# Patient Record
Sex: Male | Born: 1950 | Race: Black or African American | Hispanic: No | Marital: Single | Smoking: Former smoker
Health system: Southern US, Community
[De-identification: ages and names within clinical notes are randomized; demographics above are authoritative.]

## PROBLEM LIST (undated history)

## (undated) DIAGNOSIS — I10 Essential (primary) hypertension: Secondary | ICD-10-CM

## (undated) DIAGNOSIS — R7989 Other specified abnormal findings of blood chemistry: Secondary | ICD-10-CM

## (undated) DIAGNOSIS — D573 Sickle-cell trait: Secondary | ICD-10-CM

## (undated) HISTORY — PX: OTHER SURGICAL HISTORY: SHX169

## (undated) HISTORY — DX: Other specified abnormal findings of blood chemistry: R79.89

## (undated) HISTORY — DX: Sickle-cell trait: D57.3

## (undated) HISTORY — DX: Essential (primary) hypertension: I10

---

## 2001-09-25 ENCOUNTER — Ambulatory Visit: Admission: RE | Admit: 2001-09-25 | Discharge: 2001-09-25 | Payer: Self-pay | Admitting: Internal Medicine

## 2001-11-03 ENCOUNTER — Encounter: Admission: RE | Admit: 2001-11-03 | Discharge: 2001-11-03 | Payer: Self-pay | Admitting: Internal Medicine

## 2001-11-03 ENCOUNTER — Encounter: Payer: Self-pay | Admitting: Internal Medicine

## 2001-11-10 ENCOUNTER — Encounter: Payer: Self-pay | Admitting: Internal Medicine

## 2001-11-10 ENCOUNTER — Ambulatory Visit (HOSPITAL_COMMUNITY): Admission: RE | Admit: 2001-11-10 | Discharge: 2001-11-10 | Payer: Self-pay | Admitting: Internal Medicine

## 2003-02-17 ENCOUNTER — Emergency Department (HOSPITAL_COMMUNITY): Admission: EM | Admit: 2003-02-17 | Discharge: 2003-02-17 | Payer: Self-pay | Admitting: Emergency Medicine

## 2004-06-23 ENCOUNTER — Emergency Department (HOSPITAL_COMMUNITY): Admission: EM | Admit: 2004-06-23 | Discharge: 2004-06-23 | Payer: Self-pay | Admitting: Podiatry

## 2004-09-03 ENCOUNTER — Ambulatory Visit (HOSPITAL_COMMUNITY): Admission: RE | Admit: 2004-09-03 | Discharge: 2004-09-03 | Payer: Self-pay | Admitting: Gastroenterology

## 2004-09-03 ENCOUNTER — Encounter (INDEPENDENT_AMBULATORY_CARE_PROVIDER_SITE_OTHER): Payer: Self-pay | Admitting: Specialist

## 2009-11-28 ENCOUNTER — Ambulatory Visit (HOSPITAL_COMMUNITY): Admission: RE | Admit: 2009-11-28 | Discharge: 2009-11-28 | Payer: Self-pay | Admitting: General Practice

## 2009-11-28 ENCOUNTER — Encounter: Admission: RE | Admit: 2009-11-28 | Discharge: 2009-11-28 | Payer: Self-pay | Admitting: General Practice

## 2009-11-28 ENCOUNTER — Emergency Department (HOSPITAL_COMMUNITY): Admission: EM | Admit: 2009-11-28 | Discharge: 2009-11-28 | Payer: Self-pay | Admitting: Emergency Medicine

## 2010-10-30 NOTE — Op Note (Signed)
[name], [name]             ACCOUNT NO.:  [id number]   MEDICAL RECORD NO.:  [id number]          PATIENT TYPE:  AMB   LOCATION:  ENDO                         FACILITY:  [facility]   PHYSICIAN:  Graylin Shiver, M.D.   DATE OF BIRTH:  [birth year]   DATE OF PROCEDURE:  09/03/2004  DATE OF DISCHARGE:                                 OPERATIVE REPORT   PROCEDURE PERFORMED:  Colonoscopy with biopsy.   INDICATIONS:  Screening.   Informed consent was obtained after explanation of the risks of bleeding,  infection and perforation.   PREMEDICATION:  Fentanyl 100 mcg IV, Versed 10 milligrams IV.   PROCEDURE:  With the patient in the left lateral decubitus position, a  rectal exam was performed. No masses were felt. The Olympus colonoscope was  inserted into the rectum and advanced around the colon to the cecum. Cecal  landmarks were identified.  In the cecum on the distal lip of the ileocecal  valve, there was a 3 mm polyp biopsied off with cold forceps. The ascending  colon looked normal. There were some scattered diverticula noted in the  transverse colon, descending colon and sigmoid, the rectum looked normal.  The tolerated the procedure well without complications.   IMPRESSION:  1.  Cecal polyp.  2.  Diverticulosis.   PLAN:  The pathology will be checked.      SFG/MEDQ  D:  09/03/2004  T:  09/03/2004  Job:  [id number]   cc:   Brett Canales A. Cleta Alberts, M.D.  [address]  [place]  [place] [address]  Fax: [phone]

## 2011-06-25 ENCOUNTER — Other Ambulatory Visit: Payer: Self-pay | Admitting: General Practice

## 2011-06-25 ENCOUNTER — Ambulatory Visit
Admission: RE | Admit: 2011-06-25 | Discharge: 2011-06-25 | Disposition: A | Discharge status: Home / Self Care | Payer: BC Managed Care – PPO | Source: Ambulatory Visit | Attending: General Practice | Admitting: General Practice

## 2011-06-25 DIAGNOSIS — I10 Essential (primary) hypertension: Secondary | ICD-10-CM

## 2012-04-05 ENCOUNTER — Other Ambulatory Visit: Payer: Self-pay | Admitting: Family Medicine

## 2012-04-05 NOTE — Telephone Encounter (Signed)
Please pull chart.

## 2012-04-06 NOTE — Telephone Encounter (Signed)
Chart pulled to PA pool at nurses station DOS 02/28/11

## 2012-06-21 ENCOUNTER — Other Ambulatory Visit: Payer: Self-pay | Admitting: General Practice

## 2012-06-21 ENCOUNTER — Ambulatory Visit
Admission: RE | Admit: 2012-06-21 | Discharge: 2012-06-21 | Disposition: A | Discharge status: Home / Self Care | Payer: BC Managed Care – PPO | Source: Ambulatory Visit | Attending: General Practice | Admitting: General Practice

## 2012-06-21 DIAGNOSIS — Z0000 Encounter for general adult medical examination without abnormal findings: Secondary | ICD-10-CM

## 2012-06-21 DIAGNOSIS — I10 Essential (primary) hypertension: Secondary | ICD-10-CM

## 2014-10-14 ENCOUNTER — Other Ambulatory Visit: Payer: Self-pay | Admitting: General Practice

## 2014-10-14 ENCOUNTER — Ambulatory Visit
Admission: RE | Admit: 2014-10-14 | Discharge: 2014-10-14 | Disposition: A | Discharge status: Home / Self Care | Payer: BC Managed Care – PPO | Source: Ambulatory Visit | Attending: General Practice | Admitting: General Practice

## 2014-10-14 DIAGNOSIS — Z0000 Encounter for general adult medical examination without abnormal findings: Secondary | ICD-10-CM

## 2015-05-07 ENCOUNTER — Ambulatory Visit (INDEPENDENT_AMBULATORY_CARE_PROVIDER_SITE_OTHER): Payer: BC Managed Care – PPO | Admitting: Emergency Medicine

## 2015-05-07 ENCOUNTER — Ambulatory Visit (INDEPENDENT_AMBULATORY_CARE_PROVIDER_SITE_OTHER): Payer: BC Managed Care – PPO

## 2015-05-07 ENCOUNTER — Encounter: Payer: Self-pay | Admitting: Emergency Medicine

## 2015-05-07 VITALS — BP 130/88 | HR 69 | Temp 99.2°F | Resp 18 | Ht 68.8 in | Wt 233.0 lb

## 2015-05-07 DIAGNOSIS — Z1211 Encounter for screening for malignant neoplasm of colon: Secondary | ICD-10-CM

## 2015-05-07 DIAGNOSIS — R059 Cough, unspecified: Secondary | ICD-10-CM

## 2015-05-07 DIAGNOSIS — R739 Hyperglycemia, unspecified: Secondary | ICD-10-CM | POA: Diagnosis not present

## 2015-05-07 DIAGNOSIS — N528 Other male erectile dysfunction: Secondary | ICD-10-CM | POA: Diagnosis not present

## 2015-05-07 DIAGNOSIS — N529 Male erectile dysfunction, unspecified: Secondary | ICD-10-CM | POA: Insufficient documentation

## 2015-05-07 DIAGNOSIS — R5382 Chronic fatigue, unspecified: Secondary | ICD-10-CM | POA: Diagnosis not present

## 2015-05-07 DIAGNOSIS — R05 Cough: Secondary | ICD-10-CM | POA: Diagnosis not present

## 2015-05-07 DIAGNOSIS — R0602 Shortness of breath: Secondary | ICD-10-CM | POA: Diagnosis not present

## 2015-05-07 DIAGNOSIS — Z114 Encounter for screening for human immunodeficiency virus [HIV]: Secondary | ICD-10-CM | POA: Diagnosis not present

## 2015-05-07 DIAGNOSIS — Z1159 Encounter for screening for other viral diseases: Secondary | ICD-10-CM

## 2015-05-07 DIAGNOSIS — R0683 Snoring: Secondary | ICD-10-CM | POA: Diagnosis not present

## 2015-05-07 DIAGNOSIS — I10 Essential (primary) hypertension: Secondary | ICD-10-CM | POA: Diagnosis not present

## 2015-05-07 DIAGNOSIS — H9201 Otalgia, right ear: Secondary | ICD-10-CM | POA: Diagnosis not present

## 2015-05-07 LAB — COMPLETE METABOLIC PANEL WITH GFR
ALT: 17 U/L (ref 9–46)
AST: 22 U/L (ref 10–35)
Albumin: 4 g/dL (ref 3.6–5.1)
Alkaline Phosphatase: 64 U/L (ref 40–115)
BILIRUBIN TOTAL: 0.6 mg/dL (ref 0.2–1.2)
BUN: 17 mg/dL (ref 7–25)
CALCIUM: 9.1 mg/dL (ref 8.6–10.3)
CHLORIDE: 102 mmol/L (ref 98–110)
CO2: 30 mmol/L (ref 20–31)
CREATININE: 1.18 mg/dL (ref 0.70–1.25)
GFR, EST AFRICAN AMERICAN: 76 mL/min (ref >=60)
GFR, Est Non African American: 66 mL/min (ref >=60)
Glucose, Bld: 117 mg/dL — ABNORMAL HIGH (ref 65–99)
Potassium: 3.5 mmol/L (ref 3.5–5.3)
Sodium: 141 mmol/L (ref 135–146)
Total Protein: 6.6 g/dL (ref 6.1–8.1)

## 2015-05-07 LAB — POCT URINALYSIS DIP (MANUAL ENTRY)
BILIRUBIN UA: NEGATIVE
BILIRUBIN UA: NEGATIVE
Glucose, UA: NEGATIVE
Leukocytes, UA: NEGATIVE
Nitrite, UA: NEGATIVE
PH UA: 7
Protein Ur, POC: 30 — AB
RBC UA: NEGATIVE
Spec Grav, UA: 1.015
Urobilinogen, UA: 0.2

## 2015-05-07 LAB — POCT CBC
Granulocyte percent: 57.4 %G (ref 37–80)
HEMATOCRIT: 41.2 % — AB (ref 43.5–53.7)
HEMOGLOBIN: 14 g/dL — AB (ref 14.1–18.1)
Lymph, poc: 2.3 (ref 0.6–3.4)
MCH: 31.9 pg — AB (ref 27–31.2)
MCHC: 33.9 g/dL (ref 31.8–35.4)
MCV: 94 fL (ref 80–97)
MID (cbc): 0.4 (ref 0–0.9)
MPV: 7.7 fL (ref 0–99.8)
POC GRANULOCYTE: 3.6 (ref 2–6.9)
POC LYMPH PERCENT: 36.3 %L (ref 10–50)
POC MID %: 6.3 % (ref 0–12)
Platelet Count, POC: 205 10*3/uL (ref 142–424)
RBC: 4.38 M/uL — AB (ref 4.69–6.13)
RDW, POC: 13.6 %
WBC: 6.3 10*3/uL (ref 4.6–10.2)

## 2015-05-07 LAB — TSH: TSH: 0.938 u[IU]/mL (ref 0.350–4.500)

## 2015-05-07 LAB — HEMOCCULT GUIAC POC 1CARD (OFFICE): Card #1 Date: NEGATIVE

## 2015-05-07 LAB — GLUCOSE, POCT (MANUAL RESULT ENTRY): POC Glucose: 132 mg/dl — AB (ref 70–99)

## 2015-05-07 MED ORDER — LOSARTAN POTASSIUM 100 MG PO TABS: 100.0000 mg | ORAL_TABLET | Freq: Every day | ORAL | Status: DC

## 2015-05-07 MED ORDER — SILDENAFIL CITRATE 100 MG PO TABS: 50.0000 mg | ORAL_TABLET | Freq: Every day | ORAL | Status: DC | PRN

## 2015-05-07 NOTE — Patient Instructions (Signed)
I have made you an appointment for a sleep study. I have made to a referral to cardiology and gastroenterology. Please stop your lisinopril. Please start your losartan.

## 2015-05-07 NOTE — Progress Notes (Addendum)
Patient ID: Jeffrey Parks, male   DOB: July 07, 1950, 64 y.o.   MRN: [id number]     This chart was scribed for Arlyss Queen, MD by Zola Button, Medical Scribe. This patient was seen in room 2 and the patient's care was started at 11:32 AM.   Chief Complaint:  Chief Complaint  Patient presents with  . Dizziness    Onset 2 months  . Fatigue    4-5 months  . No sex drive  . Otalgia    Right, onset 2 days  . Cough    Onset 2 weeks    HPI: Jeffrey Parks is a 64 y.o. male who reports to [facility] today complaining of gradual onset cough that started 2 weeks ago. Patient reports associated right ear pain and chest pain with cough. He has also had fatigue, decreased sexual drive, and intermittent lightheadedness over the past 3 months. Patient notes that he sleeps a lot and has been noted to snore sometimes by his wife. He has not had a sleep study done before and has not had blood work in over 2 years. Patient has a history of smoking (3 cigarettes a day), but quit about a year ago. He does drink alcohol, about 3-4 drinks a week. His FMHx includes DM on his mother's side. He has had a colonoscopy before. He last had his prostate checked a few years ago.  Patient has seen me in the past, several years ago. He has been seeing Dr. Amadeo Garnet for his care. He is a professor at [employer]. He is originally from [place] and [place].  Past Medical History  Diagnosis Date  . Hypertension    History reviewed. No pertinent past surgical history. Social History   Social History  . Marital Status: Single    Spouse Name: N/A  . Number of Children: N/A  . Years of Education: N/A   Social History Main Topics  . Smoking status: Former Research scientist (life sciences)  . Smokeless tobacco: None  . Alcohol Use: 1.8 oz/week    3 Standard drinks or equivalent per week  . Drug Use: No  . Sexual Activity: Not Asked   Other Topics Concern  . None   Social History Narrative  . None   Family History  Problem Relation Age of Onset    . Hypertension Mother    No Known Allergies Prior to Admission medications   Medication Sig Start Date End Date Taking? Authorizing Provider  amLODipine (NORVASC) 10 MG tablet Take 1 tablet (10 mg total) by mouth daily. Needs office visit/labs 04/05/12   Collene Leyden, PA-C  chlorthalidone (HYGROTON) 25 MG tablet Take 1 tablet (25 mg total) by mouth daily. Needs office visit/labs 04/05/12   Collene Leyden, PA-C  lisinopril (PRINIVIL,ZESTRIL) 40 MG tablet Take 1 tablet (40 mg total) by mouth daily. Needs office visit/labs 04/05/12   Collene Leyden, PA-C     ROS: The patient denies fevers, chills, night sweats, unintentional weight loss, palpitations, wheezing, dyspnea on exertion, nausea, vomiting, abdominal pain, dysuria, hematuria, melena, numbness, weakness, or tingling.   All other systems have been reviewed and were otherwise negative with the exception of those mentioned in the HPI and as above.    PHYSICAL EXAM: Filed Vitals:   05/07/15 1124 05/07/15 1128  BP: 160/92 130/88  Pulse: 69   Temp: 99.2 F (37.3 C)   Resp: 18    Body mass index is 34.59 kg/(m^2).   General: Alert, no acute distress HEENT:  Normocephalic,  atraumatic, oropharynx patent. Wax in right auditory canal. Eye: [facility], [facility] Cardiovascular:  Regular rate and rhythm, no rubs murmurs or gallops.  No Carotid bruits, radial pulse intact. No pedal edema.  Respiratory: Clear to auscultation bilaterally.  No wheezes, rales, or rhonchi.  No cyanosis, no use of accessory musculature Abdominal: Obese, no masses. No hernias. Musculoskeletal: Gait intact. No edema, tenderness Skin: No rashes. Neurologic: Facial musculature symmetric. Psychiatric: Patient acts appropriately throughout our interaction. Lymphatic: No cervical or submandibular lymphadenopathy Genitourinary: Prostate not enlarged.   LABS: Results for orders placed or performed in visit on 05/07/15  POCT CBC  Result Value Ref Range   WBC 6.3  4.6 - 10.2 K/uL   Lymph, poc 2.3 0.6 - 3.4   POC LYMPH PERCENT 36.3 10 - 50 %L   MID (cbc) 0.4 0 - 0.9   POC MID % 6.3 0 - 12 %M   POC Granulocyte 3.6 2 - 6.9   Granulocyte percent 57.4 37 - 80 %G   RBC 4.38 (A) 4.69 - 6.13 M/uL   Hemoglobin 14.0 (A) 14.1 - 18.1 g/dL   HCT, POC 41.2 (A) 43.5 - 53.7 %   MCV 94.0 80 - 97 fL   MCH, POC 31.9 (A) 27 - 31.2 pg   MCHC 33.9 31.8 - 35.4 g/dL   RDW, POC 13.6 %   Platelet Count, POC 205 142 - 424 K/uL   MPV 7.7 0 - 99.8 fL  POCT urinalysis dipstick  Result Value Ref Range   Color, UA yellow yellow   Clarity, UA clear clear   Glucose, UA negative negative   Bilirubin, UA negative negative   Ketones, POC UA negative negative   Spec Grav, UA 1.015    Blood, UA negative negative   pH, UA 7.0    Protein Ur, POC =30 (A) negative   Urobilinogen, UA 0.2    Nitrite, UA Negative Negative   Leukocytes, UA Negative Negative  POCT glucose (manual entry)  Result Value Ref Range   POC Glucose 132 (A) 70 - 99 mg/dl     EKG/XRAY:   Primary read interpreted by Dr. Everlene Farrier at [facility]. Normal sinus rhythm. There is a mild curve in the thoracic spine. With clear lung fields normal heart size on chest x-ray.   ASSESSMENT/PLAN: I changed him from lisinopril to losartan to help with cough. Sleep study was ordered. Regarding his fatigue I ordered a TSH as well as the sleep study. Referral made to cardiology because of his complaints of fatigue and shortness of breath. I gave him a prescription for Viagra for ED.  By signing my name below, I, Zola Button, attest that this documentation has been prepared under the direction and in the presence of Arlyss Queen, MD.  Electronically Signed: Zola Button, Medical Scribe. 05/07/2015. 11:32 AM.   Johney Maine sideeffects, risk and benefits, and alternatives of medications d/w patient. Patient is aware that all medications have potential sideeffects and we are unable to predict every sideeffect or drug-drug interaction that may  occur.  Arlyss Queen MD 05/07/2015 11:32 AM

## 2015-05-08 LAB — HEPATITIS C ANTIBODY: HCV AB: NEGATIVE

## 2015-05-08 LAB — HIV ANTIBODY (ROUTINE TESTING W REFLEX): HIV 1&2 Ab, 4th Generation: NONREACTIVE

## 2015-05-12 LAB — TESTOSTERONE, FREE, TOTAL, SHBG
SEX HORMONE BINDING: 26 nmol/L (ref 22–77)
Testosterone, Free: 43.2 pg/mL — ABNORMAL LOW (ref 47.0–244.0)
Testosterone-% Free: 2.2 % (ref 1.6–2.9)
Testosterone: 199 ng/dL — ABNORMAL LOW (ref 300–890)

## 2015-05-14 ENCOUNTER — Other Ambulatory Visit: Payer: Self-pay | Admitting: *Deleted

## 2015-05-14 ENCOUNTER — Telehealth: Payer: Self-pay | Admitting: *Deleted

## 2015-05-14 DIAGNOSIS — R7989 Other specified abnormal findings of blood chemistry: Secondary | ICD-10-CM

## 2015-05-14 NOTE — Telephone Encounter (Signed)
Pt called about his colonscopy.  He stated that he had one somewhere and do not remember where he got it.  He said that we made the referral.  I pulled his old chart and did not see where we had ever referred him.  Advised him that I could give him some numbers per Dr. Everlene Farrier and he could call around to see. He declined.  It shows written in the paper chart that it was done in 2010.

## 2015-05-15 ENCOUNTER — Encounter: Payer: Self-pay | Admitting: Endocrinology

## 2015-05-22 ENCOUNTER — Ambulatory Visit (INDEPENDENT_AMBULATORY_CARE_PROVIDER_SITE_OTHER): Payer: BC Managed Care – PPO | Admitting: Neurology

## 2015-05-22 ENCOUNTER — Encounter: Payer: Self-pay | Admitting: Endocrinology

## 2015-05-22 ENCOUNTER — Ambulatory Visit (INDEPENDENT_AMBULATORY_CARE_PROVIDER_SITE_OTHER): Payer: BC Managed Care – PPO | Admitting: Endocrinology

## 2015-05-22 ENCOUNTER — Encounter: Payer: Self-pay | Admitting: Neurology

## 2015-05-22 VITALS — BP 143/94 | HR 88 | Temp 98.2°F | Ht 68.5 in | Wt 246.0 lb

## 2015-05-22 VITALS — BP 118/80 | HR 86 | Resp 29 | Ht 68.0 in | Wt 244.0 lb

## 2015-05-22 DIAGNOSIS — E349 Endocrine disorder, unspecified: Secondary | ICD-10-CM

## 2015-05-22 DIAGNOSIS — E291 Testicular hypofunction: Secondary | ICD-10-CM | POA: Diagnosis not present

## 2015-05-22 DIAGNOSIS — Z125 Encounter for screening for malignant neoplasm of prostate: Secondary | ICD-10-CM

## 2015-05-22 DIAGNOSIS — R0683 Snoring: Secondary | ICD-10-CM | POA: Diagnosis not present

## 2015-05-22 LAB — PSA: PSA: 0.73 ng/mL (ref 0.10–4.00)

## 2015-05-22 LAB — LUTEINIZING HORMONE: LH: 3.38 m[IU]/mL (ref 1.50–9.30)

## 2015-05-22 MED ORDER — CLOMIPHENE CITRATE 50 MG PO TABS: ORAL_TABLET | ORAL | Status: DC

## 2015-05-22 NOTE — Progress Notes (Signed)
SLEEP MEDICINE CLINIC   Provider:  Larey Seat, M D  Referring Provider: Darlyne Russian, MD Primary Care Physician:  Jenny Reichmann, MD  Chief Complaint  Patient presents with  . New Patient (Initial Visit)    daytime fatigue, occasional snoring at night per wife, rm 54, alone    HPI:  Jeffrey Parks is a 64 y.o. male , seen here as a referral from Dr. Everlene Farrier for  a sleep consultation,  Mr. Ronyn Dziadosz presents today upon referral of his primary care physician Dr. Everlene Farrier, who reported that the patient had an increasing fatigue for the last 4 or 5 months and that his wife had confirmed that the patient snores. There was also some intermittent lightheadedness. He had never been evaluated in a sleep study.  He has a history of smoking also he only smoked 3 cigarettes a day and quit about a year ago. He drinks alcohol 3 or 4 drinks a week very moderately, he drinks diet coke, but not daily. No coffee and no tea.    When he presented he had also chest pain and was coughing. He does not feel excessively daytime sleepy and actually feels that his sleep quality is good. He does not have problems falling asleep and staying asleep. His wife sometimes feels he sleeps too much but he thinks that this is actually the sleep he needed all his life.   He endorsed the Epworth sleepiness score at 2 and the fatigue severity score at 18 points the geriatric depression score at one point.  Sleep habits are as follows: Usually he goes to bed shortly before Midnight , and he falls asleep very promptly. His bedroom is described as core, quiet and dark. He shares it with his wife.  Sometimes his sleep is restless but not every night usually only when he ate late.  He is not sure if his wife has witnessed any position dependent snoring. He prefers to sleep on his side, he sleeps on more than one pillow. He will have to to 3 times at night the urge to urinate and go to the bathroom. He usually rises in the morning at  about 7 AM, feeling well. He does not report any morning headaches, he usually does not have a dry mouth. He is ready to start his day. He relies on an alarm. His average nocturnal sleep time is between 6 and 7 hours. When he is not at work he will take daytime naps at home. He does not struggle with drowsiness at work. A nap at home will last about 2 hours. The naps is also refreshing and restoring. He usually naps in bed.   Sleep medical history and family sleep history:  He has no family history of sleep disorders, no personal history of sleep walking.   Social history:   Review of Systems: Out of a complete 14 system review, the patient complains of only the following symptoms, and all other reviewed systems are negative. Snoring. No DM, HTN controlled on medications.  Epworth score 2 , Fatigue severity score 18  , depression score 2.    Social History   Social History  . Marital Status: Single    Spouse Name: N/A  . Number of Children: N/A  . Years of Education: N/A   Occupational History  . Not on file.   Social History Main Topics  . Smoking status: Former Research scientist (life sciences)  . Smokeless tobacco: Not on file  . Alcohol Use: 1.8  oz/week    3 Standard drinks or equivalent per week  . Drug Use: No  . Sexual Activity: Not on file   Other Topics Concern  . Not on file   Social History Narrative    Family History  Problem Relation Age of Onset  . Hypertension Mother   . Other Neg Hx     hypogonadism  . Prostate cancer Father     Past Medical History  Diagnosis Date  . Hypertension     History reviewed. No pertinent past surgical history.  Current Outpatient Prescriptions  Medication Sig Dispense Refill  . amLODipine (NORVASC) 10 MG tablet Take 1 tablet (10 mg total) by mouth daily. Needs office visit/labs 30 tablet 0  . chlorthalidone (HYGROTON) 25 MG tablet Take 1 tablet (25 mg total) by mouth daily. Needs office visit/labs 30 tablet 0  . losartan (COZAAR) 100 MG  tablet Take 1 tablet (100 mg total) by mouth daily. 90 tablet 3  . sildenafil (VIAGRA) 100 MG tablet Take 0.5-1 tablets (50-100 mg total) by mouth daily as needed for erectile dysfunction. (Patient not taking: Reported on 05/22/2015) 5 tablet 11   No current facility-administered medications for this visit.    Allergies as of 05/22/2015  . (No Known Allergies)    Vitals: BP 118/80 mmHg  Pulse 86  Resp 29  Ht 5\' 8"  (1.727 m)  Wt 244 lb (110.678 kg)  BMI 37.11 kg/m2 Last Weight:  Wt Readings from Last 1 Encounters:  05/22/15 244 lb (110.678 kg)   [id number] mass index is 37.11 kg/(m^2).     Last Height:   Ht Readings from Last 1 Encounters:  05/22/15 5\' 8"  (1.727 m)    Physical exam:  General: The patient is awake, alert and appears not in acute distress. The patient is well groomed. Head: Normocephalic, atraumatic. Neck is supple. Mallampati 4,  neck circumference:19.5. Nasal airflow restricited, congested, TMJ is not  evident. Retrognathia is seen.  Cardiovascular:  Regular rate and rhythm without  murmurs or carotid bruit, and without distended neck veins. Respiratory: Lungs are clear to auscultation. Skin:  Without evidence of edema, or rash Trunk: BMI is elevated . Neurologic exam : The patient is awake and alert, oriented to place and time.   Memory subjective described as intact.  Attention span & concentration ability appears normal.  Speech is fluent, without  dysarthria, dysphonia or aphasia.  Mood and affect are appropriate.  Cranial nerves: Pupils are equal and briskly reactive to light.  Funduscopic exam without  evidence of pallor or edema.  Extraocular movements  in vertical and horizontal planes intact and without nystagmus.  Visual fields by finger perimetry are intact. Hearing to finger rub intact.   Facial sensation intact to fine touch.  Facial motor strength is symmetric and tongue and uvula move midline. Shoulder shrug was symmetrical.   Motor exam:  Normal tone, muscle bulk and symmetric strength in all extremities. Sensory:  Fine touch, pinprick and vibration were tested in all extremities.  Proprioception tested in the upper extremities was normal. Coordination: Rapid alternating movements in the fingers/hands was normal.  Finger-to-nose maneuver  normal without evidence of ataxia, dysmetria or tremor.  Gait and station: Patient walks without assistive device and is able unassisted to climb up to the exam table. Strength within normal limits.  Stance is stable and normal.  Toe and heel stand were intact.Tandem gait is fragmented. Turns with 4  Steps.   Deep tendon reflexes: in the  upper and  lower extremities are symmetric and intact..  The patient was advised of the nature of the diagnosed sleep disorder , the treatment options and risks for general a health and wellness arising from not treating the condition.  I spent more than 30 minutes of face to face time with the patient. Greater than 50% of time was spent in counseling and coordination of care. We have discussed the diagnosis and differential and I answered the patient's questions.     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 The snoring that his wife has witnessed is not an everyday phenomenon. It may be positional but he can't be sure.  He reports that his weight has been stable over the last 2 years he does have an elevated body mass index. He does have a larger neck circumference,  he has mild retrognathia , does have a crowded lower jaw with a high-grade Mallampati-  which makes it likely that he snores.   I would like to order a home sleep test for this  patient. My goal is to screen for apnea.  We have a to meet with the patient after the home sleep test results are available. Given his reported benign symptomatology it is possible that he may not have apnea at all. He was checked for testosterone, and will start a supplement with Dr. Loanne Drilling, he has been taking multivitamin.    Asencion Partridge Kylar Speelman MD  05/22/2015   CC: Darlyne Russian, [address] [place], [place] [address]

## 2015-05-22 NOTE — Patient Instructions (Signed)
blood tests are requested for you today.  We'll let you know about the results. Based on the results, I may be able to prescribe for you a pill to raise the testosterone. normalization of testosterone is not known to harm you.  however, there are "theoretical" risks, including increased fertility, hair loss, prostate cancer, benign prostate enlargement, blood clots, liver problems, lower hdl ("good cholesterol"), polycythemia (opposite of anemia), sleep apnea, and behavior changes Weight loss helps the testosterone also.

## 2015-05-22 NOTE — Progress Notes (Signed)
Subjective:    Patient ID: Jeffrey Parks, male    DOB: [birth year], 64 y.o.   MRN: [id number]  HPI Pt reports he had puberty at the normal age.  He has 4 biological children.  He says he has never taken illicit androgens.  He does not take antiandrogens or opioids.  He denies any h/o infertility, XRT, or genital infection.  He has never had surgery, or a serious injury to the head or genital area.  He does not consume alcohol excessively.  He has slight dizziness sensation in the head, and assoc fatigue. He took testosterone injections x 3 months, in early 2016.  He stopped, due to lack of improvement in his sxs.   Past Medical History  Diagnosis Date  . Hypertension     No past surgical history on file.  Social History   Social History  . Marital Status: Single    Spouse Name: N/A  . Number of Children: N/A  . Years of Education: N/A   Occupational History  . Not on file.   Social History Main Topics  . Smoking status: Former Research scientist (life sciences)  . Smokeless tobacco: Not on file  . Alcohol Use: 1.8 oz/week    3 Standard drinks or equivalent per week  . Drug Use: No  . Sexual Activity: Not on file   Other Topics Concern  . Not on file   Social History Narrative    Current Outpatient Prescriptions on File Prior to Visit  Medication Sig Dispense Refill  . amLODipine (NORVASC) 10 MG tablet Take 1 tablet (10 mg total) by mouth daily. Needs office visit/labs 30 tablet 0  . chlorthalidone (HYGROTON) 25 MG tablet Take 1 tablet (25 mg total) by mouth daily. Needs office visit/labs 30 tablet 0  . losartan (COZAAR) 100 MG tablet Take 1 tablet (100 mg total) by mouth daily. 90 tablet 3  . sildenafil (VIAGRA) 100 MG tablet Take 0.5-1 tablets (50-100 mg total) by mouth daily as needed for erectile dysfunction. (Patient not taking: Reported on 05/22/2015) 5 tablet 11   No current facility-administered medications on file prior to visit.    No Known Allergies  Family History  Problem  Relation Age of Onset  . Hypertension Mother   . Other Neg Hx     hypogonadism  . Prostate cancer Father     BP 143/94 mmHg  Pulse 88  Temp(Src) 98.2 F (36.8 C) (Oral)  Ht 5' 8.5" (1.74 m)  Wt 246 lb (111.585 kg)  BMI 36.86 kg/m2  SpO2 98%   Review of Systems denies depression, numbness, weight change, decreased urinary stream, gynecomastia, fever, headache, easy bruising, rash, blurry vision, rhinorrhea, chest pain.  He has doe, myalgias, and ED sxs.      Objective:   Physical Exam VS: see vs page GEN: no distress HEAD: head: no deformity eyes: no periorbital swelling, no proptosis external nose and ears are normal mouth: no lesion seen NECK: supple, thyroid is not enlarged CHEST WALL: no deformity LUNGS: clear to auscultation BREASTS:  No gynecomastia CV: reg rate and rhythm, no murmur ABD: abdomen is soft, nontender.  no hepatosplenomegaly.  not distended.  no hernia GENITALIA:  Normal male.   MUSCULOSKELETAL: muscle bulk and strength are grossly normal.  no obvious joint swelling.  gait is normal and steady EXTEMITIES: no deformity.  no ulcer on the feet.  feet are of normal color and temp.  1+ bilat leg edema. PULSES: dorsalis pedis intact bilat.  no carotid bruit  NEURO:  cn 2-12 grossly intact.   readily moves all 4's.  sensation is intact to touch on the feet SKIN:  Normal texture and temperature.  No rash or suspicious lesion is visible.  Normal hair distribution. NODES:  None palpable at the neck. PSYCH: alert, well-oriented.  Does not appear anxious nor depressed.  Lab Results  Component Value Date   TESTOSTERONE 199* 05/07/2015   LH=3 Prolactin=7 PSA=0.7  I have reviewed outside records, and summarized: Pt was noted to have hypogonadism, and referred here.      Assessment & Plan:  Central hypogonadism, new, uncertain etiology.  Testosterone level is not low enough to need MRI.    Patient is advised the following: Patient Instructions  blood  tests are requested for you today.  We'll let you know about the results. Based on the results, I may be able to prescribe for you a pill to raise the testosterone. normalization of testosterone is not known to harm you.  however, there are "theoretical" risks, including increased fertility, hair loss, prostate cancer, benign prostate enlargement, blood clots, liver problems, lower hdl ("good cholesterol"), polycythemia (opposite of anemia), sleep apnea, and behavior changes Weight loss helps the testosterone also.  addendum: i have sent a prescription to your pharmacy, for clomid Recheck labs in 4-6 weeks

## 2015-05-22 NOTE — Patient Instructions (Signed)
Hypersomnia Hypersomnia is when you feel extremely tired during the day even though you're getting plenty of sleep at night. You may need to take naps during the day, and you may also be extremely difficult to wake up when you are sleeping.  CAUSES  The cause of your hypersomnia may not be known. Hypersomnia may be caused by:   Medicines.  Sleep disorders, such as narcolepsy.  Trauma or injury to your head or nervous system.  Using drugs or alcohol.  Tumors.  Medical conditions, such as depression or hypothyroidism.  Genetics. SIGNS AND SYMPTOMS  The main symptoms of hypersomnia include:   Feeling extremely tired throughout the day.  Being very difficult to wake up.  Sleeping for longer and longer periods.  Taking naps throughout the day. Other symptoms may include:   Feeling:  Restless.  Annoyed.  Anxious.  Low energy.  Having difficulty:  Remembering.  Speaking.  Thinking.  Losing your appetite.  Experiencing hallucinations. DIAGNOSIS  Hypersomnia may be diagnosed by:  Medical history and physical exam. This will include a sleep history.  Completing sleep logs.  Tests may also be done, such as:  Polysomnography.  Multiple sleep latency test (MSLT). TREATMENT  There is no cure for hypersomnia, but treatment can be very effective in helping manage the condition. Treatment may include:  Lifestyle and sleeping strategies to help cope with the condition.  Stimulant medicines.  Treating any underlying causes of hypersomnia. HOME CARE INSTRUCTIONS  Take medicines only as directed by your health care provider.  Schedule short naps for when you feel sleepiest during the day. Tell your employer or teachers that you have hypersomnia. You may be able to adjust your schedule to include time for naps.  Avoid drinking alcohol or caffeinated beverages.  Do not eat a heavy meal before bedtime. Eat at about the same times every day.  Do not drive or  operate heavy machinery if you are sleepy.  Do not swim or go out on the water without a life jacket.  If possible, adjust your schedule so that you do not have to work or be active at night.  Keep all follow-up visits as directed by your health care provider. This is important. SEEK MEDICAL CARE IF:   You have new symptoms.  Your symptoms get worse. SEEK IMMEDIATE MEDICAL CARE IF:  You have serious thoughts of hurting yourself or someone else.   This information is not intended to replace advice given to you by your health care provider. Make sure you discuss any questions you have with your health care provider.   Document Released: 05/21/2002 Document Revised: 06/21/2014 Document Reviewed: 01/03/2014 Elsevier Interactive Patient Education 2016 Elsevier Inc.  

## 2015-05-23 LAB — PROLACTIN: PROLACTIN: 7.5 ng/mL (ref 2.1–17.1)

## 2015-05-26 ENCOUNTER — Ambulatory Visit: Payer: BC Managed Care – PPO | Admitting: Endocrinology

## 2015-05-27 ENCOUNTER — Ambulatory Visit: Payer: BC Managed Care – PPO | Admitting: Endocrinology

## 2015-05-30 ENCOUNTER — Ambulatory Visit (INDEPENDENT_AMBULATORY_CARE_PROVIDER_SITE_OTHER): Payer: BC Managed Care – PPO | Admitting: Cardiology

## 2015-05-30 ENCOUNTER — Encounter: Payer: Self-pay | Admitting: Cardiology

## 2015-05-30 VITALS — BP 134/92 | HR 72 | Ht 67.0 in | Wt 249.1 lb

## 2015-05-30 DIAGNOSIS — R5382 Chronic fatigue, unspecified: Secondary | ICD-10-CM | POA: Diagnosis not present

## 2015-05-30 DIAGNOSIS — R0600 Dyspnea, unspecified: Secondary | ICD-10-CM

## 2015-05-30 DIAGNOSIS — R5383 Other fatigue: Secondary | ICD-10-CM | POA: Insufficient documentation

## 2015-05-30 DIAGNOSIS — E785 Hyperlipidemia, unspecified: Secondary | ICD-10-CM | POA: Diagnosis not present

## 2015-05-30 DIAGNOSIS — Z79899 Other long term (current) drug therapy: Secondary | ICD-10-CM | POA: Diagnosis not present

## 2015-05-30 LAB — BASIC METABOLIC PANEL
BUN: 13 mg/dL (ref 7–25)
CALCIUM: 9.6 mg/dL (ref 8.6–10.3)
CO2: 30 mmol/L (ref 20–31)
Chloride: 101 mmol/L (ref 98–110)
Creat: 1.15 mg/dL (ref 0.70–1.25)
GLUCOSE: 107 mg/dL — AB (ref 65–99)
POTASSIUM: 3.6 mmol/L (ref 3.5–5.3)
SODIUM: 138 mmol/L (ref 135–146)

## 2015-05-30 LAB — LIPID PANEL
CHOL/HDL RATIO: 3.7 ratio (ref <=5.0)
CHOLESTEROL: 181 mg/dL (ref 125–200)
HDL: 49 mg/dL (ref >=40)
LDL Cholesterol: 115 mg/dL (ref <130)
Triglycerides: 86 mg/dL (ref <150)
VLDL: 17 mg/dL (ref <30)

## 2015-05-30 NOTE — Progress Notes (Signed)
Cardiology Office Note   Date:  05/30/2015   ID:  Jeffrey Parks, DOB Dec 12, 1950, MRN [id number]  PCP:  Jenny Reichmann, MD  Cardiologist:   Minus Breeding, MD   Chief Complaint  Patient presents with  . Fatigue  . Shortness of Breath      History of Present Illness: Jeffrey Parks is a 64 y.o. male who presents for evaluation of fatigue. He was referred by Dr. Elder Cyphers we'll also mention shortness of breath. The patient's predominant complaint has been feeling very sluggish particularly when he walks.   Today he is denying any significant shortness of breath with this. Because of his significant fatigue he was sent for a sleep study but was told that he didn't have significant sleep disorder. He has been found to have low testosterone and has just started having this replaced. He's not particularly active. However, with these activities of daily living he reports that he feels tired when he tries to do things. He thinks this is all of the time. He's not in pain. He does not describe chest pressure, neck or arm discomfort. He's not describing palpitations, presyncope or syncope. I did review his vitals and he has slowly been gaining weight but he doesn't think his diet has changed dramatically. I reviewed his blood work his thyroid was normal. He is not anemic.  Past Medical History  Diagnosis Date  . Hypertension   . Low testosterone   . Sickle cell trait [facility])     Past Surgical History  Procedure Laterality Date  . None       Current Outpatient Prescriptions  Medication Sig Dispense Refill  . amLODipine (NORVASC) 10 MG tablet Take 1 tablet (10 mg total) by mouth daily. Needs office visit/labs 30 tablet 0  . chlorthalidone (HYGROTON) 25 MG tablet Take 1 tablet (25 mg total) by mouth daily. Needs office visit/labs 30 tablet 0  . clomiPHENE (CLOMID) 50 MG tablet 1/4 tab daily 10 tablet 11  . losartan (COZAAR) 100 MG tablet Take 1 tablet (100 mg total) by mouth daily. 90  tablet 3  . sildenafil (VIAGRA) 100 MG tablet Take 0.5-1 tablets (50-100 mg total) by mouth daily as needed for erectile dysfunction. 5 tablet 11   No current facility-administered medications for this visit.    Allergies:   Review of patient's allergies indicates no known allergies.    Social History:  The patient  reports that he has quit smoking. His smoking use included Cigarettes. He does not have any smokeless tobacco history on file. He reports that he drinks about 1.8 oz of alcohol per week. He reports that he does not use illicit drugs.   Family History:  The patient's family history includes Hypertension in his mother; Sickle cell anemia in his son and son.    ROS:  Please see the history of present illness.   Otherwise, review of systems are positive for none.   All other systems are reviewed and negative.    PHYSICAL EXAM: VS:  BP 134/92 mmHg  Pulse 72  Ht 5\' 7"  (1.702 m)  Wt 249 lb 1.6 oz (112.991 kg)  BMI 39.01 kg/m2 , BMI Body mass index is 39.01 kg/(m^2). GENERAL:  Well appearing HEENT:  Pupils equal round and reactive, fundi not visualized, oral mucosa unremarkable NECK:  No jugular venous distention, waveform within normal limits, carotid upstroke brisk and symmetric, no bruits, no thyromegaly LYMPHATICS:  No cervical, inguinal adenopathy LUNGS:  Clear to auscultation bilaterally  BACK:  No CVA tenderness CHEST:  Unremarkable HEART:  PMI not displaced or sustained,S1 and S2 within normal limits, no S3, no S4, no clicks, no rubs, no murmurs ABD:  Flat, positive bowel sounds normal in frequency in pitch, no bruits, no rebound, no guarding, no midline pulsatile mass, no hepatomegaly, no splenomegaly EXT:  2 plus pulses throughout, no edema, no cyanosis no clubbing SKIN:  No rashes no nodules NEURO:  Cranial nerves II through XII grossly intact, motor grossly intact throughout PSYCH:  Cognitively intact, oriented to person place and time    EKG:  EKG is not ordered  today. The ekg ordered 11/23 demonstrates  Sinus rhythm, rate 69, leftward axis , early transition in lead V2 , intervals within normal limits, nonspecific T-wave flattening.   Recent Labs: 05/07/2015: ALT 17; BUN 17; Creat 1.18; Hemoglobin 14.0*; Potassium 3.5; Sodium 141; TSH 0.938   No results found for: CHOL, TRIG, HDL, LDLCALC, LDLDIRECT  Lipid Panel No results found for: CHOL, TRIG, HDL, CHOLHDL, VLDL, LDLCALC, LDLDIRECT    Wt Readings from Last 3 Encounters:  05/30/15 249 lb 1.6 oz (112.991 kg)  05/22/15 244 lb (110.678 kg)  05/22/15 246 lb (111.585 kg)      Other studies Reviewed: Additional studies/ records that were reviewed today include: Primary care office notes and sleep study. Review of the above records demonstrates:  Please see elsewhere in the note.     ASSESSMENT AND PLAN:  FATIGUE:   This is probably multifactorial. I would wait to see how he improves or not with testosterone replacement therapy. At this point I do not strongly suspecting single cardiac etiology as a cause.  OVERWEIGHT:  The patient understands the need to lose weight with diet and exercise. We have discussed specific strategies for this.    DYSPNEA:   He seems to minimize this  Symptom currently. However, I would like to check a BNP level. I will bring the patient back for a POET (Plain Old Exercise Test). This will allow me to screen for obstructive coronary disease, risk stratify and very importantly provide a prescription for exercise.  RISK REDUCTION:  I will check a lipid profile.   Current medicines are reviewed at length with the patient today.  The patient does not have concerns regarding medicines.  The following changes have been made:  no change  Labs/ tests ordered today include:   Orders Placed This Encounter  Procedures  . Lipid panel  . Basic metabolic panel  . Exercise Tolerance Test     Disposition:   FU with me as needed.      Signed, Minus Breeding, MD    05/30/2015 1:33 PM    [facility]

## 2015-05-30 NOTE — Patient Instructions (Signed)
Your physician has requested that you have an exercise tolerance test. For further information please visit [email or web address]. Please also follow instruction sheet, as given.  Your physician recommends that you return for lab work in: fasting today at [place] recommends that you schedule a follow-up appointment in: as needed - we will call you with the test results

## 2015-06-02 ENCOUNTER — Telehealth: Payer: Self-pay | Admitting: Cardiology

## 2015-06-02 NOTE — Telephone Encounter (Signed)
Follow Up ° ° ° ° °Pt is returning call from earlier. Please call. °

## 2015-06-02 NOTE — Telephone Encounter (Signed)
Returned call to patient lab results given. 

## 2015-06-17 ENCOUNTER — Ambulatory Visit (INDEPENDENT_AMBULATORY_CARE_PROVIDER_SITE_OTHER): Payer: BC Managed Care – PPO | Admitting: Family Medicine

## 2015-06-17 VITALS — BP 134/84 | HR 83 | Temp 98.3°F | Resp 16 | Ht 70.0 in | Wt 247.0 lb

## 2015-06-17 DIAGNOSIS — I10 Essential (primary) hypertension: Secondary | ICD-10-CM | POA: Diagnosis not present

## 2015-06-17 DIAGNOSIS — E669 Obesity, unspecified: Secondary | ICD-10-CM

## 2015-06-17 DIAGNOSIS — R29898 Other symptoms and signs involving the musculoskeletal system: Secondary | ICD-10-CM

## 2015-06-17 MED ORDER — AMLODIPINE BESYLATE 10 MG PO TABS: 10.0000 mg | ORAL_TABLET | Freq: Every day | ORAL | Status: DC

## 2015-06-17 MED ORDER — LOSARTAN POTASSIUM-HCTZ 100-25 MG PO TABS: 1.0000 | ORAL_TABLET | Freq: Every day | ORAL | Status: DC

## 2015-06-17 NOTE — Patient Instructions (Addendum)
Losartan HCT 100/25 one daily  Amlodipine 10 mg 1 daily  Work hard on trying to eat less and exercise more. I encourage you to use a step counter. It is recommended that everyone try to get in 10,000 steps a day, but at least you should try and given 6000 steps a day. I recommend taking a walk of at least 30 minutes 5 days a week or more.  Take Tylenol if needed for the knee pain (acetaminophen) 500 mg 2 pills twice daily  Return in 6 months, sooner if problems arise. Monitor your blood pressure occasionally on outpatient basis, and if it is running higher than 140/90 you should come in sooner.

## 2015-06-17 NOTE — Progress Notes (Signed)
Patient ID: Jeffrey Parks, male    DOB: 1951-01-13  Age: 65 y.o. MRN: [id number]  Chief Complaint  Patient presents with  . 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 He has been doing fairly well. However he does have some popping of his knees. He asked about his calcium level, which he had 2 months ago and was normal. He needs a refill on his blood pressure medications. He gets some exercise walking around the campus. He does not seem to have a regular exercise program. He does not smoke. No chest pains, palpitations, or dyspnea.  Current allergies, medications, problem list, past/family and social histories reviewed.  Objective:  BP 134/98 mmHg  Pulse 83  Temp(Src) 98.3 F (36.8 C)  Resp 16  Ht 5\' 10"  (1.778 m)  Wt 247 lb (112.038 kg)  BMI 35.44 kg/m2  SpO2 93%  No major acute distress. He is obese. His neck is supple without nodes. Chest clear. Heart regular without murmur. Without edema.  Assessment & Plan:   Assessment: 1. HTN (hypertension), benign   2. Obesity   3. Popping sound of knee joint       Plan: Refill his medications. No other labs needed today. He can take Tylenol for his knee pains. Urged him to work harder on losing weight that that would help both his blood pressure and his joints. He never did end up having a sleep study scheduled which was supposed to have been scheduled.    Meds ordered this encounter  Medications  . hydrochlorothiazide (HYDRODIURIL) 25 MG tablet    Sig: Take 25 mg by mouth daily.  Marland Kitchen amLODipine (NORVASC) 10 MG tablet    Sig: Take 1 tablet (10 mg total) by mouth daily. Needs office visit/labs    Dispense:  90 tablet    Refill:  3  . losartan-hydrochlorothiazide (HYZAAR) 100-25 MG tablet    Sig: Take 1 tablet by mouth daily.    Dispense:  90 tablet    Refill:  3         Patient Instructions  Losartan HCT 100/25 one  daily  Amlodipine 10 mg 1 daily  Work hard on trying to eat less and exercise more. I encourage you to use a step counter. It is recommended that everyone try to get in 10,000 steps a day, but at least you should try and given 6000 steps a day. I recommend taking a walk of at least 30 minutes 5 days a week or more.  Take Tylenol if needed for the knee pain (acetaminophen) 500 mg 2 pills twice daily  Return in 6 months, sooner if problems arise. Monitor your blood pressure occasionally on outpatient basis, and if it is running higher than 140/90 you should come in sooner.   Patient declines getting a flu shot. Return in about 6 months (around 12/15/2015).   HOPPER,DAVID, MD 06/17/2015

## 2015-06-25 ENCOUNTER — Other Ambulatory Visit: Payer: BC Managed Care – PPO

## 2015-06-25 ENCOUNTER — Telehealth (HOSPITAL_COMMUNITY): Payer: Self-pay

## 2015-06-25 ENCOUNTER — Other Ambulatory Visit: Payer: Self-pay

## 2015-06-25 ENCOUNTER — Telehealth: Payer: Self-pay | Admitting: Neurology

## 2015-06-25 ENCOUNTER — Encounter (HOSPITAL_COMMUNITY): Payer: BC Managed Care – PPO

## 2015-06-25 DIAGNOSIS — E291 Testicular hypofunction: Secondary | ICD-10-CM

## 2015-06-25 DIAGNOSIS — R0683 Snoring: Secondary | ICD-10-CM

## 2015-06-25 NOTE — Telephone Encounter (Signed)
Spoke with Dr. Brett Fairy. She gave verbal order for a PSG. Please call pt and discuss, advise him that the HST was denied. Thank you!

## 2015-06-25 NOTE — Telephone Encounter (Signed)
Encounter complete. 

## 2015-06-25 NOTE — Telephone Encounter (Signed)
[place] denied HST

## 2015-06-26 ENCOUNTER — Other Ambulatory Visit: Payer: Self-pay | Admitting: Endocrinology

## 2015-06-26 DIAGNOSIS — E349 Endocrine disorder, unspecified: Secondary | ICD-10-CM

## 2015-06-26 LAB — TESTOSTERONE,FREE AND TOTAL
TESTOSTERONE FREE: 5.7 pg/mL — AB (ref 6.6–18.1)
Testosterone: 178 ng/dL — ABNORMAL LOW (ref 348–1197)

## 2015-06-26 MED ORDER — CLOMIPHENE CITRATE 50 MG PO TABS: 25.0000 mg | ORAL_TABLET | Freq: Every day | ORAL | Status: DC

## 2015-06-27 ENCOUNTER — Ambulatory Visit (HOSPITAL_COMMUNITY)
Admission: RE | Admit: 2015-06-27 | Discharge: 2015-06-27 | Disposition: A | Discharge status: Home / Self Care | Payer: BC Managed Care – PPO | Source: Ambulatory Visit | Attending: Cardiology | Admitting: Cardiology

## 2015-06-27 ENCOUNTER — Encounter (HOSPITAL_COMMUNITY): Payer: Self-pay | Admitting: *Deleted

## 2015-06-27 DIAGNOSIS — R0600 Dyspnea, unspecified: Secondary | ICD-10-CM

## 2015-06-27 NOTE — Progress Notes (Unsigned)
Patient ID: Jeffrey Parks, male   DOB: [birth year], 65 y.o.   MRN: [id number] Jeffrey Parks came in for his ETT, BP:  156/119, after wait, 175/109 & 173/108 He went to Nuclear waiting area and after 45 min. 174/120 & 184/127; Manually and automatic cuff. He has rescheduled for 07/18/15, and pressures will be given to Dr. Percival Spanish

## 2015-07-18 ENCOUNTER — Ambulatory Visit (HOSPITAL_COMMUNITY)
Admission: RE | Admit: 2015-07-18 | Discharge: 2015-07-18 | Disposition: A | Discharge status: Home / Self Care | Payer: BC Managed Care – PPO | Source: Ambulatory Visit | Attending: Cardiology | Admitting: Cardiology

## 2015-07-18 DIAGNOSIS — R0600 Dyspnea, unspecified: Secondary | ICD-10-CM

## 2015-07-18 DIAGNOSIS — M25569 Pain in unspecified knee: Secondary | ICD-10-CM | POA: Insufficient documentation

## 2015-07-18 LAB — EXERCISE TOLERANCE TEST
CHL CUP MPHR: 157 {beats}/min
CHL RATE OF PERCEIVED EXERTION: 17
CSEPEW: 4.6 METS
CSEPHR: 107 %
CSEPPHR: 169 {beats}/min
Exercise duration (min): 3 min
Exercise duration (sec): 5 s
Rest HR: 73 {beats}/min

## 2015-07-22 ENCOUNTER — Telehealth: Payer: Self-pay | Admitting: Cardiology

## 2015-07-22 NOTE — Telephone Encounter (Signed)
New message ° ° ° ° ° °Returning a call to the nurse to get test results °

## 2015-07-22 NOTE — Telephone Encounter (Signed)
Patient given results of stress test.

## 2015-07-23 NOTE — Telephone Encounter (Signed)
Noted - Thank you.  Information/results sent to Dr Everlene Farrier.

## 2015-08-01 ENCOUNTER — Ambulatory Visit (INDEPENDENT_AMBULATORY_CARE_PROVIDER_SITE_OTHER): Payer: BC Managed Care – PPO | Admitting: Neurology

## 2015-08-01 DIAGNOSIS — G4710 Hypersomnia, unspecified: Secondary | ICD-10-CM | POA: Diagnosis not present

## 2015-08-01 DIAGNOSIS — R0683 Snoring: Secondary | ICD-10-CM

## 2015-08-02 NOTE — Sleep Study (Signed)
Please see the scanned sleep study interpretation located in the procedure tab in the chart view section.  

## 2015-08-06 ENCOUNTER — Telehealth: Payer: Self-pay

## 2015-08-06 DIAGNOSIS — G4733 Obstructive sleep apnea (adult) (pediatric): Secondary | ICD-10-CM

## 2015-08-06 NOTE — Telephone Encounter (Signed)
Called pt to advised him of sleep study results. No answer, phone did not have VM set up. Will try again later.

## 2015-08-07 NOTE — Telephone Encounter (Signed)
LM for patient (ok per DPR) that he has severe sleep apnea for which CPAP is recommended. Dawn will call him to set up titration study. I left call back number for further questions. I will put order in and fax this report to PCP.

## 2015-08-08 ENCOUNTER — Ambulatory Visit (INDEPENDENT_AMBULATORY_CARE_PROVIDER_SITE_OTHER): Payer: BC Managed Care – PPO | Admitting: Endocrinology

## 2015-08-08 VITALS — BP 134/78 | HR 76 | Temp 99.2°F | Ht 70.0 in | Wt 250.6 lb

## 2015-08-08 DIAGNOSIS — E291 Testicular hypofunction: Secondary | ICD-10-CM

## 2015-08-08 LAB — CBC WITH DIFFERENTIAL/PLATELET
BASOS ABS: 0 10*3/uL (ref 0.0–0.1)
Basophils Relative: 0.5 % (ref 0.0–3.0)
Eosinophils Absolute: 0.2 10*3/uL (ref 0.0–0.7)
Eosinophils Relative: 2.1 % (ref 0.0–5.0)
HEMATOCRIT: 41.4 % (ref 39.0–52.0)
HEMOGLOBIN: 14 g/dL (ref 13.0–17.0)
LYMPHS PCT: 40.6 % (ref 12.0–46.0)
Lymphs Abs: 3.1 10*3/uL (ref 0.7–4.0)
MCHC: 33.7 g/dL (ref 30.0–36.0)
MCV: 92 fl (ref 78.0–100.0)
MONOS PCT: 8.3 % (ref 3.0–12.0)
Monocytes Absolute: 0.6 10*3/uL (ref 0.1–1.0)
NEUTROS ABS: 3.6 10*3/uL (ref 1.4–7.7)
Neutrophils Relative %: 48.5 % (ref 43.0–77.0)
Platelets: 180 10*3/uL (ref 150.0–400.0)
RBC: 4.5 Mil/uL (ref 4.22–5.81)
RDW: 13.4 % (ref 11.5–15.5)
WBC: 7.5 10*3/uL (ref 4.0–10.5)

## 2015-08-08 NOTE — Progress Notes (Signed)
   Subjective:    Patient ID: Jeffrey Parks, male    DOB: [birth year], 65 y.o.   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 Since clomid was increased to 1/2 tab qd, he feels no different.  He reports decreased libido.  Past Medical History  Diagnosis Date  . Hypertension   . Low testosterone   . Sickle cell trait [facility])     Past Surgical History  Procedure Laterality Date  . None      Social History   Social History  . Marital Status: Single    Spouse Name: N/A  . Number of Children: N/A  . Years of Education: N/A   Occupational History  . Not on file.   Social History Main Topics  . Smoking status: Former Smoker    Types: Cigarettes  . Smokeless tobacco: Not on file     Comment: Quit two years ago.  Light smoker prior.   . Alcohol Use: 1.8 oz/week    3 Standard drinks or equivalent per week  . Drug Use: No  . Sexual Activity: Not on file   Other Topics Concern  . Not on file   Social History Narrative   Lives with wife.      Current Outpatient Prescriptions on File Prior to Visit  Medication Sig Dispense Refill  . amLODipine (NORVASC) 10 MG tablet Take 1 tablet (10 mg total) by mouth daily. Needs office visit/labs 90 tablet 3  . chlorthalidone (HYGROTON) 25 MG tablet Take 1 tablet (25 mg total) by mouth daily. Needs office visit/labs 30 tablet 0  . clomiPHENE (CLOMID) 50 MG tablet Take 0.5 tablets (25 mg total) by mouth daily. 15 tablet 11  . losartan-hydrochlorothiazide (HYZAAR) 100-25 MG tablet Take 1 tablet by mouth daily. 90 tablet 3   No current facility-administered medications on file prior to visit.    No Known Allergies  Family History  Problem Relation Age of Onset  . Hypertension Mother   . Diabetes    . Sickle cell anemia Son    . Sickle cell anemia Son     BP 134/78 mmHg  Pulse 76  Temp(Src) 99.2 F (37.3 C) (Oral)  Ht 5\' 10"  (1.778 m)  Wt 250 lb 9.6 oz (113.671 kg)  BMI 35.96 kg/m2  SpO2 95%  Review of Systems Denies decreased urinary stream    Objective:   Physical Exam VITAL SIGNS:  See vs page GENERAL: no distress GENITALIA:  Normal male. Ext: no edema  Lab Results  Component Value Date   TESTOSTERONE 199* 08/08/2015       Assessment & Plan:  Hypogonadism: he needs increased rx  Patient is advised the following: Patient Instructions  blood tests are requested for you today.  We'll let you know about the results.  normalization of testosterone is not known to harm you.  however, there are "theoretical" risks, including increased fertility, hair loss, prostate cancer, benign prostate enlargement, blood clots, liver problems, lower hdl ("good cholesterol"), polycythemia (opposite of anemia), sleep apnea, and behavior changes.   Weight loss helps the testosterone also.    Addendum: pt is advised: your options are to increase the clomiphene to a whole pill per day, or to change to the testosterone gel. Please let us know

## 2015-08-08 NOTE — Progress Notes (Signed)
Pre visit review using our clinic review tool, if applicable. No additional management support is needed unless otherwise documented below in the visit note. 

## 2015-08-08 NOTE — Patient Instructions (Addendum)
blood tests are requested for you today.  We'll let you know about the results.  normalization of testosterone is not known to harm you.  however, there are "theoretical" risks, including increased fertility, hair loss, prostate cancer, benign prostate enlargement, blood clots, liver problems, lower hdl ("good cholesterol"), polycythemia (opposite of anemia), sleep apnea, and behavior changes.   Weight loss helps the testosterone also.

## 2015-08-09 LAB — TESTOSTERONE,FREE AND TOTAL
TESTOSTERONE FREE: 5.3 pg/mL — AB (ref 6.6–18.1)
TESTOSTERONE: 199 ng/dL — AB (ref 348–1197)

## 2015-08-10 ENCOUNTER — Encounter: Payer: Self-pay | Admitting: Endocrinology

## 2015-08-11 ENCOUNTER — Other Ambulatory Visit: Payer: Self-pay | Admitting: Endocrinology

## 2015-08-11 DIAGNOSIS — E291 Testicular hypofunction: Secondary | ICD-10-CM

## 2015-08-11 MED ORDER — CLOMIPHENE CITRATE 50 MG PO TABS: 50.0000 mg | ORAL_TABLET | Freq: Every day | ORAL | Status: DC

## 2015-08-25 ENCOUNTER — Telehealth: Payer: Self-pay | Admitting: Endocrinology

## 2015-08-25 NOTE — Telephone Encounter (Signed)
please call patient: Jeffrey Parks is the message i sent back to you  Hi Mr. Bas:  Thanks for your message.  i have sent a prescription to your pharmacy, to increase to 1 pill per day.  Please recheck the blood test in 4-6 weeks, Jeffrey Parks in the office.      Previous Messages     ----- Message -----   From: Carley Hammed   Sent: 08/10/2015 3:50 PM EST    To: Renato Shin, MD  Subject: Non-Urgent Medical Question   Hello Sir,  Thanks for your recent communication. At this time I choose the option of increasing my clomiphene to 1 pill daily.

## 2015-08-26 NOTE — Telephone Encounter (Signed)
Spoke to pt, told him Dr. Loanne Drilling has sent a prescription to your pharmacy, to increase to 1 pill per day. Please recheck the blood test in 4-6 weeks, here in the office, just need to call and schedule lab appt. Pt verbalized understanding.

## 2015-10-18 ENCOUNTER — Ambulatory Visit (INDEPENDENT_AMBULATORY_CARE_PROVIDER_SITE_OTHER): Payer: BC Managed Care – PPO | Admitting: Family Medicine

## 2015-10-18 VITALS — BP 128/88 | HR 88 | Temp 99.1°F | Resp 18 | Ht 70.0 in | Wt 250.0 lb

## 2015-10-18 DIAGNOSIS — I10 Essential (primary) hypertension: Secondary | ICD-10-CM

## 2015-10-18 DIAGNOSIS — M5431 Sciatica, right side: Secondary | ICD-10-CM | POA: Diagnosis not present

## 2015-10-18 MED ORDER — DICLOFENAC SODIUM 1 % TD GEL: 2.0000 g | Freq: Four times a day (QID) | TRANSDERMAL | Status: DC

## 2015-10-18 MED ORDER — LOSARTAN POTASSIUM-HCTZ 100-25 MG PO TABS: 1.0000 | ORAL_TABLET | Freq: Every day | ORAL | Status: DC

## 2015-10-18 MED ORDER — PREDNISONE 20 MG PO TABS: ORAL_TABLET | ORAL | Status: DC

## 2015-10-18 MED ORDER — FUROSEMIDE 40 MG PO TABS: 40.0000 mg | ORAL_TABLET | Freq: Every day | ORAL | Status: DC

## 2015-10-18 NOTE — Patient Instructions (Addendum)
1.  No sodas, sparkling water 2.  Don't eat after 7 3.  Plan meals a day in advance   Recheck 10 days

## 2015-10-18 NOTE — Progress Notes (Signed)
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 He's married, has grandchildren that live in the area.  He comes in with right sciatica and low back pain on the right. This been going on for over month. He has no urinary symptoms or fevers.  Patient also has hypertension and ankle swelling. He's been on Norvasc since 2 years ago. He's also taking losartan HCT and Clomid.  Patient complains about loss of sexual interest.  Patient denies any weakness in his legs. He has trouble getting up from a chair with his back being stiff and sore. He has no numbness in his right leg.  Objective:BP 128/88 mmHg  Pulse 88  Temp(Src) 99.1 F (37.3 C) (Oral)  Resp 18  Ht 5\' 10"  (1.778 m)  Wt 250 lb (113.399 kg)  BMI 35.87 kg/m2  SpO2 97% Chest is clear Heart: Regular no murmur Skin: No rashes, patient does have hyperpigmentation around his neck. Straight leg raising is positive on the right only. Lumbar spine: Nontender Extremities: Good pedal pulses but he does have 2+ pedal edema extending 1 Half Way up the lower extremities.  Assessment: Patient is morbidly obese and needs to start finding life-changing strategies to lose that weight or he'll continue to have structural problems. In addition, the weight loss will help him with his blood pressure. Since his wife is a Marine scientist, he can check his blood pressures at home.  Plan: Follow-up in 10 days I have Asked patient to start Lasix 40 mg daily when necessary Continue the losartan HCT but discontinue the Norvasc Prednisone 40 mg daily for 5 days Voltaren gel to the lumbar area twice a day Get wife to rub back, use hot showers Spend more time with his grandchildren We discussed dietary issues at length. Patient will stop drinking sodas, stop eating after 7 PM, and start planning his meals ahead of time.  Signed, Carola Frost.D.

## 2015-10-29 ENCOUNTER — Ambulatory Visit (INDEPENDENT_AMBULATORY_CARE_PROVIDER_SITE_OTHER): Payer: BC Managed Care – PPO | Admitting: Family Medicine

## 2015-10-29 ENCOUNTER — Encounter: Payer: Self-pay | Admitting: Family Medicine

## 2015-10-29 VITALS — BP 116/83 | HR 78 | Temp 98.2°F | Resp 16 | Ht 69.0 in | Wt 249.0 lb

## 2015-10-29 DIAGNOSIS — E669 Obesity, unspecified: Secondary | ICD-10-CM | POA: Diagnosis not present

## 2015-10-29 DIAGNOSIS — M5431 Sciatica, right side: Secondary | ICD-10-CM | POA: Diagnosis not present

## 2015-10-29 DIAGNOSIS — N528 Other male erectile dysfunction: Secondary | ICD-10-CM | POA: Diagnosis not present

## 2015-10-29 DIAGNOSIS — I10 Essential (primary) hypertension: Secondary | ICD-10-CM | POA: Diagnosis not present

## 2015-10-29 MED ORDER — PHENTERMINE HCL 15 MG PO CAPS: 15.0000 mg | ORAL_CAPSULE | ORAL | Status: DC

## 2015-10-29 NOTE — Patient Instructions (Signed)
     IF you received an x-ray today, you will receive an invoice from [facility]. Please contact [facility] at [phone] with questions or concerns regarding your invoice.   IF you received labwork today, you will receive an invoice from [facility]. Please contact [facility] at [phone] with questions or concerns regarding your invoice.   Our billing staff will not be able to assist you with questions regarding bills from these companies.  You will be contacted with the lab results as soon as they are available. The fastest way to get your results is to activate your My Chart account. Instructions are located on the last page of this paperwork. If you have not heard from us regarding the results in 2 weeks, please contact this office.      

## 2015-10-29 NOTE — Progress Notes (Signed)
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    He's here for a follow up on the right sciatica and back pain, as well as hypertension and leg swelling  He has cut down on sodas.  Packs own lunch.  Avoids eating late at night.  He will be exercising more.  He does have spells of light headedness.  He has had cardiology work up.  The symptoms are worse when getting up quickly.  He still takes Clomid.  Sleeps well  Objective:  BP 116/83 mmHg  Pulse 78  Temp(Src) 98.2 F (36.8 C)  Resp 16  Ht 5\' 9"  (1.753 m)  Wt 249 lb (112.946 kg)  BMI 36.75 kg/m2 Morbidly  Obese Wt Readings from Last 3 Encounters:  10/29/15 249 lb (112.946 kg)  10/18/15 250 lb (113.399 kg)  08/08/15 250 lb 9.6 oz (113.671 kg)  Trace edema  Knee and ankle reflexes are normal 2+ bilaterally No calf tenderness No rash.   Assessment:  Improved sciatica and back pain,  Still needs to lose weight.  Plan:   Sciatica of right side  Essential hypertension, benign  Obesity - Plan: phentermine 15 MG capsule  Other male erectile dysfunction  Robyn Haber, MD

## 2015-11-04 ENCOUNTER — Telehealth: Payer: Self-pay

## 2015-11-04 NOTE — Telephone Encounter (Signed)
PA approved for only 3 mos in 12 mos period, through 02/04/16.

## 2015-11-04 NOTE — Telephone Encounter (Signed)
PA approved. Notified pharm.

## 2015-11-04 NOTE — Telephone Encounter (Signed)
PA completed on covermymeds for phentermine. Pending.

## 2016-05-03 ENCOUNTER — Ambulatory Visit (INDEPENDENT_AMBULATORY_CARE_PROVIDER_SITE_OTHER): Payer: BC Managed Care – PPO | Admitting: Physician Assistant

## 2016-05-03 VITALS — BP 168/112 | HR 73 | Temp 98.3°F | Resp 18 | Ht 69.0 in | Wt 252.2 lb

## 2016-05-03 DIAGNOSIS — R609 Edema, unspecified: Secondary | ICD-10-CM | POA: Diagnosis not present

## 2016-05-03 DIAGNOSIS — D649 Anemia, unspecified: Secondary | ICD-10-CM

## 2016-05-03 DIAGNOSIS — K5900 Constipation, unspecified: Secondary | ICD-10-CM

## 2016-05-03 DIAGNOSIS — G8929 Other chronic pain: Secondary | ICD-10-CM | POA: Diagnosis not present

## 2016-05-03 DIAGNOSIS — I10 Essential (primary) hypertension: Secondary | ICD-10-CM

## 2016-05-03 DIAGNOSIS — Z298 Encounter for other specified prophylactic measures: Secondary | ICD-10-CM | POA: Diagnosis not present

## 2016-05-03 DIAGNOSIS — Z2989 Encounter for other specified prophylactic measures: Secondary | ICD-10-CM

## 2016-05-03 DIAGNOSIS — M545 Low back pain: Secondary | ICD-10-CM | POA: Diagnosis not present

## 2016-05-03 DIAGNOSIS — R42 Dizziness and giddiness: Secondary | ICD-10-CM | POA: Diagnosis not present

## 2016-05-03 LAB — POCT CBC
Granulocyte percent: 44.2 %G (ref 37–80)
HCT, POC: 39.7 % — AB (ref 43.5–53.7)
Hemoglobin: 13.9 g/dL — AB (ref 14.1–18.1)
Lymph, poc: 3 (ref 0.6–3.4)
MCH, POC: 31.5 pg — AB (ref 27–31.2)
MCHC: 35 g/dL (ref 31.8–35.4)
MCV: 89.9 fL (ref 80–97)
MID (cbc): 0.5 (ref 0–0.9)
MPV: 7 fL (ref 0–99.8)
POC Granulocyte: 2.7 (ref 2–6.9)
POC LYMPH PERCENT: 48.4 %L (ref 10–50)
POC MID %: 7.4 % (ref 0–12)
Platelet Count, POC: 188 10*3/uL (ref 142–424)
RBC: 4.42 M/uL — AB (ref 4.69–6.13)
RDW, POC: 14.5 %
WBC: 6.2 10*3/uL (ref 4.6–10.2)

## 2016-05-03 LAB — COMPLETE METABOLIC PANEL WITH GFR
ALT: 26 U/L (ref 9–46)
Albumin: 4.2 g/dL (ref 3.6–5.1)
Alkaline Phosphatase: 64 U/L (ref 40–115)
Creat: 1.11 mg/dL (ref 0.70–1.25)
GFR, Est African American: 81 mL/min (ref >=60)
GFR, Est Non African American: 70 mL/min (ref >=60)
Glucose, Bld: 100 mg/dL — ABNORMAL HIGH (ref 65–99)
Total Bilirubin: 0.4 mg/dL (ref 0.2–1.2)
Total Protein: 6.6 g/dL (ref 6.1–8.1)

## 2016-05-03 LAB — POC MICROSCOPIC URINALYSIS (UMFC): Mucus: ABSENT

## 2016-05-03 LAB — POCT URINALYSIS DIP (MANUAL ENTRY)
Bilirubin, UA: NEGATIVE
Blood, UA: NEGATIVE
Glucose, UA: NEGATIVE
Ketones, POC UA: NEGATIVE
Leukocytes, UA: NEGATIVE
Nitrite, UA: NEGATIVE
Protein Ur, POC: NEGATIVE
Spec Grav, UA: 1.02
Urobilinogen, UA: 0.2
pH, UA: 6

## 2016-05-03 LAB — COMPLETE METABOLIC PANEL WITHOUT GFR
AST: 23 U/L (ref 10–35)
BUN: 16 mg/dL (ref 7–25)
CO2: 31 mmol/L (ref 20–31)
Calcium: 9.3 mg/dL (ref 8.6–10.3)
Chloride: 104 mmol/L (ref 98–110)
Potassium: 4 mmol/L (ref 3.5–5.3)
Sodium: 141 mmol/L (ref 135–146)

## 2016-05-03 LAB — POCT GLYCOSYLATED HEMOGLOBIN (HGB A1C): Hemoglobin A1C: 7

## 2016-05-03 MED ORDER — DOXYCYCLINE HYCLATE 100 MG PO CAPS: 100.0000 mg | ORAL_CAPSULE | Freq: Every day | ORAL | 0 refills | Status: DC

## 2016-05-03 MED ORDER — MELOXICAM 15 MG PO TABS: 15.0000 mg | ORAL_TABLET | Freq: Every day | ORAL | 1 refills | Status: DC

## 2016-05-03 MED ORDER — POLYETHYLENE GLYCOL 3350 17 GM/SCOOP PO POWD: 17.0000 g | Freq: Two times a day (BID) | ORAL | 1 refills | Status: DC | PRN

## 2016-05-03 MED ORDER — CARVEDILOL 6.25 MG PO TABS: 6.2500 mg | ORAL_TABLET | Freq: Two times a day (BID) | ORAL | 3 refills | Status: DC

## 2016-05-03 MED ORDER — DOXYCYCLINE HYCLATE 100 MG PO TABS: 100.0000 mg | ORAL_TABLET | Freq: Every day | ORAL | 0 refills | Status: DC

## 2016-05-03 NOTE — Patient Instructions (Addendum)
-   Wear compression socks. Try to drink 2 liters (64 oz) of water/day - this will help with your dizziness and constipation. Please see below for constipation instructions.  - Please take 80mg  Lasix every day - take in the morning.  - Start Carvedilol 6.25 twice a day.  - Please follow-up next week for blood pressure re-check.  - Cardiology will contact you to schedule an appointment.  - You can try the "dix-Hallpike" maneuver at home for dizziness.  - Please perform stretches at home to help with your leg pain. Sit on the floor, legs outstretched (together and apart) and lean forward towards the middle and/or towards your toes.   For constipation   Make sure you are drinking enough water daily. Make sure you are getting enough fiber in your diet - this will make you regular - you can eat high fiber foods or use metamucil as a supplement - it is really important to drink enough water when using fiber supplements.  If your stools are hard or are formed balls or you have to strain a stool softener will help - use colace 2-3 capsule a day  For gentle treatment of constipation Use Miralax 1-2 capfuls a day until your stools are soft and regular and then decrease the usage - you can use this daily  For more aggressive treatment of constipation Use 4 capfuls of Colace and 6 doses of Miralax and drink it in 2 hours - this should result in several watery stools - if it does not repeat the next day and then go to daily miralax for a week to make sure your bowels are clean and retrained to work properly  For the most aggressive treatment of constipation Use 14 capfuls of Miralax in 1 gallon of fluid (gatoraid or water work well or a combination of the two) and drink over 12h - it is ok to eat during this time and then use Miralax 1 capful daily for about 2 weeks to prevent the constipation from returning   Constipation, Adult Constipation is when a person has fewer bowel movements in a week than  normal, has difficulty having a bowel movement, or has stools that are dry, hard, or larger than normal. Constipation may be caused by an underlying condition. It may become worse with age if a person takes certain medicines and does not take in enough fluids. Follow these instructions at home: Eating and drinking  Eat foods that have a lot of fiber, such as fresh fruits and vegetables, whole grains, and beans.  Limit foods that are high in fat, low in fiber, or overly processed, such as french fries, hamburgers, cookies, candies, and soda.  Drink enough fluid to keep your urine clear or pale yellow. General instructions  Exercise regularly or as told by your health care provider.  Go to the restroom when you have the urge to go. Do not hold it in.  Take over-the-counter and prescription medicines only as told by your health care provider. These include any fiber supplements.  Practice pelvic floor retraining exercises, such as deep breathing while relaxing the lower abdomen and pelvic floor relaxation during bowel movements.  Watch your condition for any changes.  Keep all follow-up visits as told by your health care provider. This is important.

## 2016-05-03 NOTE — Progress Notes (Signed)
Jeffrey Parks  MRN: [id number] DOB: 03/29/1951  PCP: Jenny Reichmann, MD  Subjective:  Pt is a 65 year old male, history of HTN, morbid obesity, hyperglycemia who presents to clinic for several complaints.   Dizziness x four months - When he stands up, everything moves. Also when he walks. Occasionally dizzy while stationary. Standing too long makes it worse. Nothing makes it better. Also notes weakness.   Low back pain x four months - Low back is very tight, stiffness radiates down back of both thighs.  Feet swelling - Both feet have been swelling x a few months. Takes Lasix 40mg   Blurry vision x few months - Comes and goes.  Tired and weak x a few months  HTN - Uncontrolled Blood pressure today 168/112, recheck 180/112. Takes Hyzaar 110-25 mg.  Denies hedaches, chest pain, palpitations.  Takes his medication as directed. Has tried Norvasc - causes feet swelling. Has tried Lisinopril - causes cough   Started iron 3-5 mg/day pills two weeks ago.   Would like something for malaria prophylaxis, traveling to [place] next month for three weeks.   Review of Systems  Constitutional: Positive for fatigue. Negative for chills, diaphoresis and fever.  Eyes: Positive for visual disturbance. Negative for photophobia.  Respiratory: Negative for cough, chest tightness, shortness of breath and wheezing.   Cardiovascular: Positive for leg swelling. Negative for chest pain and palpitations.  Gastrointestinal: Positive for abdominal pain and constipation. Negative for diarrhea, nausea and vomiting.  Musculoskeletal: Positive for back pain and gait problem. Negative for neck pain.  Neurological: Positive for dizziness, weakness and light-headedness. Negative for syncope, numbness and headaches.  Psychiatric/Behavioral: Negative for sleep disturbance. The patient is not nervous/anxious.     Patient Active Problem List   Diagnosis Date Noted  . Fatigue 05/30/2015  . Hypogonadism male 05/22/2015   . Screening for prostate cancer 05/22/2015  . Snoring 05/22/2015  . Morbid obesity due to excess calories ([place]) 05/22/2015  . 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 Essential hypertension 05/07/2015  . Hyperglycemia 05/07/2015  . Erectile dysfunction 05/07/2015    Current Outpatient Prescriptions on File Prior to Visit  Medication Sig Dispense Refill  . diclofenac sodium (VOLTAREN) 1 % GEL Apply 2 g topically 4 (four) times daily. 100 g 1  . furosemide (LASIX) 40 MG tablet Take 1 tablet (40 mg total) by mouth daily. 30 tablet 3  . losartan-hydrochlorothiazide (HYZAAR) 100-25 MG tablet Take 1 tablet by mouth daily. 90 tablet 3  . phentermine 15 MG capsule Take 1 capsule (15 mg total) by mouth every other day. 15 capsule 0  . clomiPHENE (CLOMID) 50 MG tablet Take 1 tablet (50 mg total) by mouth daily. (Patient not taking: Reported on 05/03/2016) 30 tablet 11   No current facility-administered medications on file prior to visit.     No Known Allergies   Objective:  BP (!) 180/112 (BP Location: Right Arm, Patient Position: Sitting, Cuff Size: Large)   Pulse 73   Temp 98.3 F (36.8 C) (Oral)   Resp 18   Ht 5\' 9"  (1.753 m)   Wt 252 lb 3.2 oz (114.4 kg)   SpO2 98%   BMI 37.24 kg/m   Physical Exam  Constitutional: He is oriented to person, place, and time and well-developed, well-nourished, and in no distress. No distress.  HENT:  States his dizziness improved after Epley maneuver.   Neck: No JVD present. Carotid bruit is not present.  Cardiovascular: Normal rate, regular rhythm, normal heart  sounds and intact distal pulses.   B/l 2+ edema to mid-tibia.   Pulmonary/Chest: Effort normal and breath sounds normal. No respiratory distress.  Abdominal: Soft. Bowel sounds are normal.  Neurological: He is alert and oriented to person, place, and time. He has normal motor skills. Gait normal. GCS score is 15.  Skin: Skin is warm and dry.  Psychiatric: Mood, memory, affect and  judgment normal.  Vitals reviewed. Orthostatic vital signs reviewed, negative.   EKG reviewed with Dr. Brigitte Pulse. NSR, looks improved from his previous EKG.  Assessment and Plan :  This case was discussed with Dr. Brigitte Pulse.   1. Dizziness - EKG 12-Lead - POCT urinalysis dipstick - POCT Microscopic Urinalysis ([facility]) - POCT CBC - COMPLETE METABOLIC PANEL WITH GFR - Brain natriuretic peptide - Orthostatic vital signs - POCT glycosylated hemoglobin (Hb A1C) - Ambulatory referral to Cardiology  2. Anemia, unspecified type - Vitamin B12 - Ferritin  3. Constipation, unspecified constipation type - polyethylene glycol powder (GLYCOLAX/MIRALAX) powder; Take 17 g by mouth 2 (two) times daily as needed.  Dispense: 578 g; Refill: 1 - Encouraged fiber supplement, increase water intake, exercise regularly.   4. Essential hypertension - carvedilol (COREG) 6.25 MG tablet; Take 1 tablet (6.25 mg total) by mouth 2 (two) times daily with a meal.  Dispense: 60 tablet; Refill: 3 - Will start Coreg in addition to his medications. RTC in one week for blood pressure recheck.   5. Need for malaria prophylaxis - doxycycline (VIBRAMYCIN) 100 MG capsule; Take 1 capsule (100 mg total) by mouth daily. Take one pill 2 days before trip, take one daily on trip, take one daily x 4 weeks after return.  Dispense: 51 capsule; Refill: 0  6. Chronic bilateral low back pain without sciatica - meloxicam (MOBIC) 15 MG tablet; Take 1 tablet (15 mg total) by mouth daily.  Dispense: 30 tablet; Refill: 1 - Encouraged patient to stretch and walk regularly.   7. Edema - Increase dose of Lasix to 80mg  qd in the morning. Wear compression socks.    Mercer Pod, PA-C  [facility] 05/03/2016 3:37 PM

## 2016-05-04 LAB — VITAMIN B12: Vitamin B-12: >2000 pg/mL — ABNORMAL HIGH (ref 200–1100)

## 2016-05-04 LAB — BRAIN NATRIURETIC PEPTIDE: Brain Natriuretic Peptide: 22.3 pg/mL (ref <100)

## 2016-05-04 LAB — FERRITIN: Ferritin: 128 ng/mL (ref 20–380)

## 2016-05-10 ENCOUNTER — Ambulatory Visit (INDEPENDENT_AMBULATORY_CARE_PROVIDER_SITE_OTHER): Payer: BC Managed Care – PPO | Admitting: Physician Assistant

## 2016-05-10 VITALS — BP 140/90 | HR 72 | Temp 98.1°F | Resp 18 | Ht 69.0 in | Wt 254.0 lb

## 2016-05-10 DIAGNOSIS — I10 Essential (primary) hypertension: Secondary | ICD-10-CM | POA: Diagnosis not present

## 2016-05-10 MED ORDER — FUROSEMIDE 40 MG PO TABS: 40.0000 mg | ORAL_TABLET | Freq: Every day | ORAL | 3 refills | Status: DC

## 2016-05-10 MED ORDER — LOSARTAN POTASSIUM-HCTZ 100-25 MG PO TABS: 1.0000 | ORAL_TABLET | Freq: Every day | ORAL | 3 refills | Status: DC

## 2016-05-10 NOTE — Progress Notes (Signed)
Jeffrey Parks  MRN: [id number] DOB: March 23, 1951  PCP: Jenny Reichmann, MD  Subjective:  Pt is a pleasant 65 year old male who presents to clinic for follow-up blood pressure check. He was here last week with pressures of 180/100 and 168/112. He was started on Carvedilol 6.25 BID, Increased Lasix to 80mg  BID, in addition to his Hyzaar 100-25mg  qd. Today's blood pressure is 140/90. Reports decrease in dizziness, although it still occasionally occurs. Is unsure if it is related to position change or not. He has plans to increase the amount he exercises, he is walking a few times a week, he plans to start walking every day.   Constipation is better. Taking fiber and colace.   Review of Systems  Constitutional: Negative for chills and diaphoresis.  Respiratory: Negative for cough, chest tightness, shortness of breath and wheezing.   Cardiovascular: Negative for chest pain, palpitations and leg swelling.  Gastrointestinal: Negative for abdominal pain, diarrhea, nausea and vomiting.  Musculoskeletal: Negative for neck pain.  Neurological: Positive for dizziness. Negative for syncope, light-headedness and headaches.  Psychiatric/Behavioral: Negative for sleep disturbance. The patient is not nervous/anxious.     Patient Active Problem List   Diagnosis Date Noted  . Fatigue 05/30/2015  . Hypogonadism male 05/22/2015  . Screening for prostate cancer 05/22/2015  . Snoring 05/22/2015  . Morbid obesity due to excess calories ([place]) 05/22/2015  . 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 Essential hypertension 05/07/2015  . Hyperglycemia 05/07/2015  . Erectile dysfunction 05/07/2015    Current Outpatient Prescriptions on File Prior to Visit  Medication Sig Dispense Refill  . carvedilol (COREG) 6.25 MG tablet Take 1 tablet (6.25 mg total) by mouth 2 (two) times daily with a meal. 60 tablet 3  . diclofenac sodium (VOLTAREN) 1 % GEL Apply 2 g topically 4 (four) times daily. 100 g 1  .  doxycycline (VIBRAMYCIN) 100 MG capsule Take 1 capsule (100 mg total) by mouth daily. Take one pill 2 days before trip, take one daily on trip, take one daily x 4 weeks after return. 51 capsule 0  . furosemide (LASIX) 40 MG tablet Take 1 tablet (40 mg total) by mouth daily. 30 tablet 3  . losartan-hydrochlorothiazide (HYZAAR) 100-25 MG tablet Take 1 tablet by mouth daily. 90 tablet 3  . meloxicam (MOBIC) 15 MG tablet Take 1 tablet (15 mg total) by mouth daily. 30 tablet 1  . phentermine 15 MG capsule Take 1 capsule (15 mg total) by mouth every other day. 15 capsule 0  . polyethylene glycol powder (GLYCOLAX/MIRALAX) powder Take 17 g by mouth 2 (two) times daily as needed. 578 g 1   No current facility-administered medications on file prior to visit.     No Known Allergies   Objective:  BP 140/90 (BP Location: Right Arm, Patient Position: Sitting, Cuff Size: Large)   Pulse 72   Temp 98.1 F (36.7 C) (Oral)   Resp 18   Ht 5\' 9"  (1.753 m)   Wt 254 lb (115.2 kg)   SpO2 97%   BMI 37.51 kg/m   Physical Exam  Constitutional: He is oriented to person, place, and time and well-developed, well-nourished, and in no distress. No distress.  Cardiovascular: Normal rate, regular rhythm and normal heart sounds.   Pulmonary/Chest: Effort normal. No respiratory distress.  Neurological: He is alert and oriented to person, place, and time. GCS score is 15.  Skin: Skin is warm and dry.  Psychiatric: Mood, memory, affect and judgment normal.  Vitals  reviewed.   Assessment and Plan :  1. Essential hypertension 2. HTN (hypertension), benign - losartan-hydrochlorothiazide (HYZAAR) 100-25 MG tablet; Take 1 tablet by mouth daily.  Dispense: 90 tablet; Refill: 3 - furosemide (LASIX) 40 MG tablet; Take 1 tablet (40 mg total) by mouth daily.  Dispense: 30 tablet; Refill: 3 - RTC in three months for blood pressure check. Consider labs at that visit. Encouraged patient to increase exercise to walking 20-30  minutes every day. He will record blood pressures at home and bring them to his next visit.   Mercer Pod, PA-C  [facility] 05/10/2016 10:58 AM

## 2016-05-10 NOTE — Patient Instructions (Addendum)
  Google "eply maneuver" follow the instructions to help with your dizziness.  Please come back in three months for blood pressure recheck. Take home blood pressures a few times a week and keep a log. Bring this with you at your next appointment. And bring Benjamine Mola!  Try to walk 20-30 minutes every day.   Thank you for coming in today. I hope you feel we met your needs.  Feel free to call [facility] if you have any questions or further requests.  Please consider signing up for MyChart if you do not already have it, as this is a great way to communicate with me.  Best,  Whitney McVey, PA-C  IF you received an x-ray today, you will receive an invoice from [facility]. Please contact [facility] at [phone] with questions or concerns regarding your invoice.   IF you received labwork today, you will receive an invoice from [employer]. Please contact [facility] at [phone] with questions or concerns regarding your invoice.   Our billing staff will not be able to assist you with questions regarding bills from these companies.  You will be contacted with the lab results as soon as they are available. The fastest way to get your results is to activate your My Chart account. Instructions are located on the last page of this paperwork. If you have not heard from [place] regarding the results in 2 weeks, please contact this office.

## 2016-06-09 ENCOUNTER — Encounter (HOSPITAL_COMMUNITY): Payer: Self-pay | Admitting: Emergency Medicine

## 2016-06-09 ENCOUNTER — Emergency Department (HOSPITAL_COMMUNITY)
Admission: EM | Admit: 2016-06-09 | Discharge: 2016-06-09 | Disposition: A | Discharge status: Home / Self Care | Payer: BC Managed Care – PPO | Attending: Emergency Medicine | Admitting: Emergency Medicine

## 2016-06-09 DIAGNOSIS — I10 Essential (primary) hypertension: Secondary | ICD-10-CM | POA: Insufficient documentation

## 2016-06-09 DIAGNOSIS — Y929 Unspecified place or not applicable: Secondary | ICD-10-CM | POA: Diagnosis not present

## 2016-06-09 DIAGNOSIS — Z87891 Personal history of nicotine dependence: Secondary | ICD-10-CM | POA: Insufficient documentation

## 2016-06-09 DIAGNOSIS — Y999 Unspecified external cause status: Secondary | ICD-10-CM | POA: Insufficient documentation

## 2016-06-09 DIAGNOSIS — Y939 Activity, unspecified: Secondary | ICD-10-CM | POA: Insufficient documentation

## 2016-06-09 DIAGNOSIS — X509XXA Other and unspecified overexertion or strenuous movements or postures, initial encounter: Secondary | ICD-10-CM | POA: Insufficient documentation

## 2016-06-09 DIAGNOSIS — S76011A Strain of muscle, fascia and tendon of right hip, initial encounter: Secondary | ICD-10-CM | POA: Diagnosis not present

## 2016-06-09 DIAGNOSIS — Z79899 Other long term (current) drug therapy: Secondary | ICD-10-CM | POA: Insufficient documentation

## 2016-06-09 DIAGNOSIS — S79911A Unspecified injury of right hip, initial encounter: Secondary | ICD-10-CM | POA: Diagnosis present

## 2016-06-09 MED ORDER — NAPROXEN 500 MG PO TABS: 500.0000 mg | ORAL_TABLET | Freq: Two times a day (BID) | ORAL | 0 refills | Status: DC

## 2016-06-09 MED ORDER — ACETAMINOPHEN ER 650 MG PO TBCR: 650.0000 mg | EXTENDED_RELEASE_TABLET | Freq: Four times a day (QID) | ORAL | 0 refills | Status: DC | PRN

## 2016-06-09 MED ORDER — DICLOFENAC SODIUM 1 % TD GEL: 4.0000 g | Freq: Four times a day (QID) | TRANSDERMAL | 1 refills | Status: DC

## 2016-06-09 NOTE — ED Provider Notes (Signed)
[facility] DEPT Provider Note   CSN: [id number] Arrival date & time: 06/09/16  1149  By signing my name below, I, Reola Mosher, attest that this documentation has been prepared under the direction and in the presence of Blanchie Dessert, MD. Electronically Signed: Reola Mosher, ED Scribe. 06/09/16. 4:04 PM.  History   Chief Complaint Chief Complaint  Patient presents with  . Groin Pain   The history is provided by the patient. No language interpreter was used.    HPI Comments: Jeffrey Parks is a 65 y.o. male with a PMHx of HTN and obesity, who presents to the Emergency Department complaining of sudden onset, persistent lower, right sided abdominal/groin pain onset ~2 days ago.  Pt reports that he was getting into his car yesterday, and as he swung his right leg into his car he felt a severe-type pain in the region.  His pain is exacerbated with ambulation. Pt has been taking 600mg  Ibuprofen intermittently at home with minimal relief. He denies his pain as being associated with being postprandial. No h/o similar pain or back issues. He denies urgency, frequency, hematuria, dysuria, difficulty urinating, diarrhea, constipation, nausea, vomiting, testicular pain, focal numbness/weakness, or any other associated symptoms. Does note bright red blood when wiping with stools starting yesterday with hx of hemorrhoids.  PCP: Juanda Crumble, PA-C, [facility]   Past Medical History:  Diagnosis Date  . Hypertension   . Low testosterone   . Sickle cell trait [facility])    Patient Active Problem List   Diagnosis Date Noted  . Fatigue 05/30/2015  . Hypogonadism male 05/22/2015  . Screening for prostate cancer 05/22/2015  . Snoring 05/22/2015  . Morbid obesity due to excess calories ([place]) 05/22/2015  . 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 Essential hypertension 05/07/2015  . Hyperglycemia 05/07/2015  . Erectile dysfunction 05/07/2015   Past Surgical History:  Procedure  Laterality Date  . None      Home Medications    Prior to Admission medications   Medication Sig Start Date End Date Taking? Authorizing Provider  carvedilol (COREG) 6.25 MG tablet Take 1 tablet (6.25 mg total) by mouth 2 (two) times daily with a meal. 05/03/16   Gelene Mink McVey, PA-C  diclofenac sodium (VOLTAREN) 1 % GEL Apply 2 g topically 4 (four) times daily. 10/18/15   Robyn Haber, MD  doxycycline (VIBRAMYCIN) 100 MG capsule Take 1 capsule (100 mg total) by mouth daily. Take one pill 2 days before trip, take one daily on trip, take one daily x 4 weeks after return. 05/03/16   Elizabeth Liberty Stead McVey, PA-C  furosemide (LASIX) 40 MG tablet Take 1 tablet (40 mg total) by mouth daily. 05/10/16   Elizabeth Charmeka [place] McVey, PA-C  losartan-hydrochlorothiazide (HYZAAR) 100-25 MG tablet Take 1 tablet by mouth daily. 05/10/16   Elizabeth Cohen Boettner McVey, PA-C  meloxicam (MOBIC) 15 MG tablet Take 1 tablet (15 mg total) by mouth daily. 05/03/16   Elizabeth Ashya Nicolaisen McVey, PA-C  phentermine 15 MG capsule Take 1 capsule (15 mg total) by mouth every other day. 10/29/15   Robyn Haber, MD  polyethylene glycol powder (GLYCOLAX/MIRALAX) powder Take 17 g by mouth 2 (two) times daily as needed. 05/03/16   Gelene Mink McVey, PA-C   Family History Family History  Problem Relation Age of Onset  . Hypertension Mother   . Diabetes    . Sickle cell anemia Son   . Sickle cell anemia Son    Social History Social History  Substance Use Topics  .  Smoking status: Former Smoker    Types: Cigarettes  . Smokeless tobacco: Never Used     Comment: Quit two years ago.  Light smoker prior.   . Alcohol use 1.8 oz/week    3 Standard drinks or equivalent per week   Allergies   Patient has no known allergies.  Review of Systems Review of Systems  Constitutional: Negative for appetite change.  Gastrointestinal: Positive for abdominal pain and blood in stool. Negative for constipation and diarrhea.    Genitourinary: Negative for difficulty urinating, dysuria, frequency, hematuria and urgency.  Neurological: Negative for weakness and numbness.  All other systems reviewed and are negative.  Physical Exam Updated Vital Signs BP 158/100 (BP Location: Right Arm)   Pulse 71   Temp 99 F (37.2 C) (Oral)   Resp 18   Ht 5\' 7"  (1.702 m)   Wt 238 lb (108 kg)   SpO2 99%   BMI 37.28 kg/m   Physical Exam  Constitutional: He appears well-developed and well-nourished. No distress.  HENT:  Head: Normocephalic and atraumatic.  Eyes: Conjunctivae are normal.  Neck: Normal range of motion.  Cardiovascular: Normal rate, regular rhythm and normal heart sounds.   No murmur heard. Pulmonary/Chest: Effort normal and breath sounds normal. No respiratory distress. He has no wheezes. He has no rales.  Abdominal: He exhibits no distension.  Genitourinary: Rectal exam shows external hemorrhoid. Rectal exam shows no tenderness.  Genitourinary Comments: Chaperone present throughout entire exam. There is no testicular pain or evidence of inguinal hernia.   Musculoskeletal: Normal range of motion. He exhibits tenderness. He exhibits no edema.  Tenderness to palpation over the medial thigh and flexor tendons of the hip. He is able to completely flex and extend the hip. There is normal muscle strength in the lower leg. No BLE edema, and pulses are 2+ bilaterally. Normal sensation in both legs.   Neurological: He is alert.  Skin: No pallor.  Psychiatric: He has a normal mood and affect. His behavior is normal.  Nursing note and vitals reviewed.  ED Treatments / Results  DIAGNOSTIC STUDIES: Oxygen Saturation is 99% on RA, normal by my interpretation.   COORDINATION OF CARE: 4:03 PM-Discussed next steps with pt. Pt verbalized understanding and is agreeable with the plan.   Procedures Procedures   Medications Ordered in ED Medications - No data to display  Initial Impression / Assessment and Plan /  ED Course  I have reviewed the triage vital signs and the nursing notes.  Clinical Course    Pt with pain to the right groin area after twisting to get his leg in the car 2 days ago.  Pt is worse with walking and flexing the leg.  No numbness or weakness.  No testicle pain or urinary issues.   On exam patient has no significant leg swelling and normal pulses. Sensation and strength is intact bilaterally. Patient is able to flex at the hip but appears to be painful. Painful with palpation in the triangle of the groin in the proximal leg. He has no testicular pain. Low suspicion for incarcerated hernia, testicular pathology or lumbar radiculopathy. Feel most likely muscle strain. Patient treated symptomatically and recommended that he follow-up with his PCP. Patient also recently began treatment for hypertension 3 months ago was started on blood pressure medication. His blood pressure continues to be elevated and was encouraged to call his doctor today to make a follow-up appointment.  Final Clinical Impressions(s) / ED Diagnoses   Final diagnoses:  Strain of flexor muscle of hip, right, initial encounter   New Prescriptions Discharge Medication List as of 06/09/2016  4:17 PM    START taking these medications   Details  acetaminophen (TYLENOL ARTHRITIS PAIN) 650 MG CR tablet Take 1 tablet (650 mg total) by mouth every 6 (six) hours as needed for pain., Starting Wed 06/09/2016, Print    naproxen (NAPROSYN) 500 MG tablet Take 1 tablet (500 mg total) by mouth 2 (two) times daily with a meal., Starting Wed 06/09/2016, Print       I personally performed the services described in this documentation, which was scribed in my presence.  The recorded information has been reviewed and considered.     Blanchie Dessert, MD 06/09/16 1640

## 2016-06-09 NOTE — ED Triage Notes (Signed)
Pt sts right sided groin pain with radiation down leg x 2 days after twisting while getting in car; pt returned from [place] yesterday; pt denies bulges or obvious hernia

## 2016-08-09 ENCOUNTER — Telehealth: Payer: Self-pay

## 2016-08-09 NOTE — Telephone Encounter (Signed)
Pharmacy is requesting amlodipine, I dont see on list? 04/2016 last ov

## 2016-08-11 ENCOUNTER — Other Ambulatory Visit: Payer: Self-pay | Admitting: Family Medicine

## 2016-08-11 NOTE — Telephone Encounter (Signed)
McVey just saw him and this was not discussed.  Send it my way and I can decline it. Philis Fendt, MS, PA-C 7:34 AM, 08/11/2016

## 2016-08-12 NOTE — Telephone Encounter (Signed)
Confirmed with pharmacy last rx was 02/2016 and has seen [facility] 04/2016 on losartin Due for ov

## 2016-08-13 ENCOUNTER — Ambulatory Visit (INDEPENDENT_AMBULATORY_CARE_PROVIDER_SITE_OTHER): Payer: BC Managed Care – PPO | Admitting: Physician Assistant

## 2016-08-13 VITALS — BP 184/110 | HR 68 | Temp 98.3°F | Ht 67.0 in | Wt 256.6 lb

## 2016-08-13 DIAGNOSIS — I10 Essential (primary) hypertension: Secondary | ICD-10-CM | POA: Diagnosis not present

## 2016-08-13 DIAGNOSIS — R42 Dizziness and giddiness: Secondary | ICD-10-CM | POA: Diagnosis not present

## 2016-08-13 DIAGNOSIS — I160 Hypertensive urgency: Secondary | ICD-10-CM

## 2016-08-13 DIAGNOSIS — R030 Elevated blood-pressure reading, without diagnosis of hypertension: Secondary | ICD-10-CM | POA: Diagnosis not present

## 2016-08-13 MED ORDER — FUROSEMIDE 40 MG PO TABS: 80.0000 mg | ORAL_TABLET | Freq: Every day | ORAL | 3 refills | Status: DC

## 2016-08-13 MED ORDER — LOSARTAN POTASSIUM-HCTZ 100-25 MG PO TABS: 1.0000 | ORAL_TABLET | Freq: Every day | ORAL | 0 refills | Status: DC

## 2016-08-13 MED ORDER — ATENOLOL 50 MG PO TABS: 50.0000 mg | ORAL_TABLET | Freq: Every day | ORAL | 0 refills | Status: DC

## 2016-08-13 MED ORDER — AMLODIPINE BESYLATE 10 MG PO TABS: 10.0000 mg | ORAL_TABLET | Freq: Every day | ORAL | 0 refills | Status: DC

## 2016-08-13 NOTE — Patient Instructions (Addendum)
You blood pressure today is 184/110. This is too high! Your high blood pressure could be causing your symptoms of dizziness and blurry vision.  Please monitor your blood pressure at home a few time this week. Write them down and bring them with you in one week.  Follow-up with me next Friday afternoon or Saturday.  We will recheck your blood pressure and go over your lab results.   Thank you for coming in today. I hope you feel we met your needs.  Feel free to call [facility] if you have any questions or further requests.  Please consider signing up for MyChart if you do not already have it, as this is a great way to communicate with me.  Best,  Whitney McVey, PA-C   DASH Eating Plan DASH stands for "Dietary Approaches to Stop Hypertension." The DASH eating plan is a healthy eating plan that has been shown to reduce high blood pressure (hypertension). It may also reduce your risk for type 2 diabetes, heart disease, and stroke. The DASH eating plan may also help with weight loss. What are tips for following this plan? General guidelines   Avoid eating more than 2,300 mg (milligrams) of salt (sodium) a day. If you have hypertension, you may need to reduce your sodium intake to 1,500 mg a day.  Limit alcohol intake to no more than 1 drink a day for nonpregnant women and 2 drinks a day for men. One drink equals 12 oz of beer, 5 oz of wine, or 1 oz of hard liquor.  Work with your health care provider to maintain a healthy body weight or to lose weight. Ask what an ideal weight is for you.  Get at least 30 minutes of exercise that causes your heart to beat faster (aerobic exercise) most days of the week. Activities may include walking, swimming, or biking.  Work with your health care provider or diet and nutrition specialist (dietitian) to adjust your eating plan to your individual calorie needs. Reading food labels   Check food labels for the amount of sodium per serving. Choose foods with less  than 5 percent of the Daily Value of sodium. Generally, foods with less than 300 mg of sodium per serving fit into this eating plan.  To find whole grains, look for the word "whole" as the first word in the ingredient list. Shopping   Buy products labeled as "low-sodium" or "no salt added."  Buy fresh foods. Avoid canned foods and premade or frozen meals. Cooking   Avoid adding salt when cooking. Use salt-free seasonings or herbs instead of table salt or sea salt. Check with your health care provider or pharmacist before using salt substitutes.  Do not fry foods. Cook foods using healthy methods such as baking, boiling, grilling, and broiling instead.  Cook with heart-healthy oils, such as olive, canola, soybean, or sunflower oil. Meal planning    Eat a balanced diet that includes:  5 or more servings of fruits and vegetables each day. At each meal, try to fill half of your plate with fruits and vegetables.  Up to 6-8 servings of whole grains each day.  Less than 6 oz of lean meat, poultry, or fish each day. A 3-oz serving of meat is about the same size as a deck of cards. One egg equals 1 oz.  2 servings of low-fat dairy each day.  A serving of nuts, seeds, or beans 5 times each week.  Heart-healthy fats. Healthy fats called Omega-3 fatty acids  are found in foods such as flaxseeds and coldwater fish, like sardines, salmon, and mackerel.  Limit how much you eat of the following:  Canned or prepackaged foods.  Food that is high in trans fat, such as fried foods.  Food that is high in saturated fat, such as fatty meat.  Sweets, desserts, sugary drinks, and other foods with added sugar.  Full-fat dairy products.  Do not salt foods before eating.  Try to eat at least 2 vegetarian meals each week.  Eat more home-cooked food and less restaurant, buffet, and fast food.  When eating at a restaurant, ask that your food be prepared with less salt or no salt, if possible. What  foods are recommended? The items listed may not be a complete list. Talk with your dietitian about what dietary choices are best for you. Grains  Whole-grain or whole-wheat bread. Whole-grain or whole-wheat pasta. Brown rice. [place]row. Bulgur. Whole-grain and low-sodium cereals. Pita bread. Low-fat, low-sodium crackers. Whole-wheat flour tortillas. Vegetables  Fresh or frozen vegetables (raw, steamed, roasted, or grilled). Low-sodium or reduced-sodium tomato and vegetable juice. Low-sodium or reduced-sodium tomato sauce and tomato paste. Low-sodium or reduced-sodium canned vegetables. Fruits  All fresh, dried, or frozen fruit. Canned fruit in natural juice (without added sugar). Meat and other protein foods  Skinless chicken or [place]. Ground chicken or [place]. Pork with fat trimmed off. Fish and seafood. Egg whites. Dried beans, peas, or lentils. Unsalted nuts, nut butters, and seeds. Unsalted canned beans. Lean cuts of beef with fat trimmed off. Low-sodium, lean deli meat. Dairy  Low-fat (1%) or fat-free (skim) milk. Fat-free, low-fat, or reduced-fat cheeses. Nonfat, low-sodium ricotta or cottage cheese. Low-fat or nonfat yogurt. Low-fat, low-sodium cheese. Fats and oils  Soft margarine without trans fats. Vegetable oil. Low-fat, reduced-fat, or light mayonnaise and salad dressings (reduced-sodium). Canola, safflower, olive, soybean, and sunflower oils. Avocado. Seasoning and other foods  Herbs. Spices. Seasoning mixes without salt. Unsalted popcorn and pretzels. Fat-free sweets. What foods are not recommended? The items listed may not be a complete list. Talk with your dietitian about what dietary choices are best for you. Grains  Baked goods made with fat, such as croissants, muffins, or some breads. Dry pasta or rice meal packs. Vegetables  Creamed or fried vegetables. Vegetables in a cheese sauce. Regular canned vegetables (not low-sodium or reduced-sodium). Regular canned tomato  sauce and paste (not low-sodium or reduced-sodium). Regular tomato and vegetable juice (not low-sodium or reduced-sodium). Angie Fava. Olives. Fruits  Canned fruit in a light or heavy syrup. Fried fruit. Fruit in cream or butter sauce. Meat and other protein foods  Fatty cuts of meat. Ribs. Fried meat. Berniece Salines. Sausage. Bologna and other processed lunch meats. Salami. Fatback. Hotdogs. Bratwurst. Salted nuts and seeds. Canned beans with added salt. Canned or smoked fish. Whole eggs or egg yolks. Chicken or [place] with skin. Dairy  Whole or 2% milk, cream, and half-and-half. Whole or full-fat cream cheese. Whole-fat or sweetened yogurt. Full-fat cheese. Nondairy creamers. Whipped toppings. Processed cheese and cheese spreads. Fats and oils  Butter. Stick margarine. Lard. Shortening. Ghee. Bacon fat. Tropical oils, such as coconut, palm kernel, or palm oil. Seasoning and other foods  Salted popcorn and pretzels. Onion salt, garlic salt, seasoned salt, table salt, and sea salt. Worcestershire sauce. Tartar sauce. Barbecue sauce. Teriyaki sauce. Soy sauce, including reduced-sodium. Steak sauce. Canned and packaged gravies. Fish sauce. Oyster sauce. Cocktail sauce. Horseradish that you find on the shelf. Ketchup. Mustard. Meat flavorings and tenderizers. Bouillon  cubes. Hot sauce and Tabasco sauce. Premade or packaged marinades. Premade or packaged taco seasonings. Relishes. Regular salad dressings. Where to find more information:  National Heart, Lung, and [place]: https://wilson-eaton.com/  American Heart Association: www.heart.org Summary  The DASH eating plan is a healthy eating plan that has been shown to reduce high blood pressure (hypertension). It may also reduce your risk for type 2 diabetes, heart disease, and stroke.  With the DASH eating plan, you should limit salt (sodium) intake to 2,300 mg a day. If you have hypertension, you may need to reduce your sodium intake to 1,500 mg a day.  When  on the DASH eating plan, aim to eat more fresh fruits and vegetables, whole grains, lean proteins, low-fat dairy, and heart-healthy fats.  Work with your health care provider or diet and nutrition specialist (dietitian) to adjust your eating plan to your individual calorie needs. This information is not intended to replace advice given to you by your health care provider. Make sure you discuss any questions you have with your health care provider. Document Released: 05/20/2011 Document Revised: 05/24/2016 Document Reviewed: 05/24/2016 Elsevier Interactive Patient Education  2017 [employer].  IF you received an x-ray today, you will receive an invoice from [facility]. Please contact [facility] at [phone] with questions or concerns regarding your invoice.   IF you received labwork today, you will receive an invoice from [facility]. Please contact [facility] at [phone] with questions or concerns regarding your invoice.   Our billing staff will not be able to assist you with questions regarding bills from these companies.  You will be contacted with the lab results as soon as they are available. The fastest way to get your results is to activate your My Chart account. Instructions are located on the last page of this paperwork. If you have not heard from [place] regarding the results in 2 weeks, please contact this office.

## 2016-08-13 NOTE — Progress Notes (Signed)
Jeffrey Parks  MRN: [id number] DOB: [birth year]  PCP: Jenny Reichmann, MD  Subjective:  Pt is a 66 year old male who presents to clinic for blood pressure check. 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 Last OV 05/10/2016 blood pressure 140/90. Was previously under the care of Dr. Joseph Art and Dr. Amadeo Garnet.   He takes Carvedilol 6.25 bid, lasix 40 mg bid, and Hyzaar 100-25 mg qd. He states he also takes Amlodipine 20m.  He has been out of his medications for week or two.  [place]   Notes instability when he walks - this has been going on for a few months and is concerning to him. Dizziness with standing abruptly. Sometimes also when he is standing. He drinks about 60 oz water/day.  Endorses SOB when walking to his car, with walking one flight of stairs. This has worsened in the past 6 months.  These symptoms have worsened over the past 2 weeks.   Review of Systems  Constitutional: Negative for chills and diaphoresis.  Eyes: Positive for visual disturbance.  Respiratory: Positive for shortness of breath. Negative for cough, chest tightness and wheezing.   Cardiovascular: Negative for chest pain, palpitations and leg swelling.  Gastrointestinal: Negative for diarrhea, nausea and vomiting.  Neurological: Positive for dizziness. Negative for syncope, light-headedness and headaches.  Psychiatric/Behavioral: Negative for sleep disturbance. The patient is not nervous/anxious.     Patient Active Problem List   Diagnosis Date Noted  . Fatigue 05/30/2015  . Hypogonadism male 05/22/2015  . Screening for prostate cancer 05/22/2015  . Snoring 05/22/2015  . Morbid obesity due to excess calories ([place] 05/22/2015  . 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 Essential hypertension 05/07/2015  . Hyperglycemia 05/07/2015  . Erectile dysfunction 05/07/2015    Current Outpatient Prescriptions on File Prior  to Visit  Medication Sig Dispense Refill  . acetaminophen (TYLENOL ARTHRITIS PAIN) 650 MG CR tablet Take 1 tablet (650 mg total) by mouth every 6 (six) hours as needed for pain. 30 tablet 0  . carvedilol (COREG) 6.25 MG tablet Take 1 tablet (6.25 mg total) by mouth 2 (two) times daily with a meal. 60 tablet 3  . diclofenac sodium (VOLTAREN) 1 % GEL Apply 4 g topically 4 (four) times daily. 100 g 1  . doxycycline (VIBRAMYCIN) 100 MG capsule Take 1 capsule (100 mg total) by mouth daily. Take one pill 2 days before trip, take one daily on trip, take one daily x 4 weeks after return. 51 capsule 0  . furosemide (LASIX) 40 MG tablet Take 1 tablet (40 mg total) by mouth daily. 30 tablet 3  . losartan-hydrochlorothiazide (HYZAAR) 100-25 MG tablet Take 1 tablet by mouth daily. 90 tablet 3  . meloxicam (MOBIC) 15 MG tablet Take 1 tablet (15 mg total) by mouth daily. 30 tablet 1  . phentermine 15 MG capsule Take 1 capsule (15 mg total) by mouth every other day. 15 capsule 0  . polyethylene glycol powder (GLYCOLAX/MIRALAX) powder Take 17 g by mouth 2 (two) times daily as needed. 578 g 1  . naproxen (NAPROSYN) 500 MG tablet Take 1 tablet (500 mg total) by mouth 2 (two) times daily with a meal. (Patient not taking: Reported on 08/13/2016) 20 tablet 0   No current facility-administered medications on file prior to visit.     No Known Allergies   Objective:  BP (!) 195/125 (BP  Location: Right Arm, Patient Position: Sitting, Cuff Size: Large)   Pulse 68   Temp 98.3 F (36.8 C) (Oral)   Ht 5' 7"  (1.702 m)   Wt 256 lb 9.6 oz (116.4 kg)   BMI 40.19 kg/m   Physical Exam  Constitutional: He is oriented to person, place, and time and well-developed, well-nourished, and in no distress. No distress.  Neck: No JVD present. Carotid bruit is not present.  Cardiovascular: Normal rate, regular rhythm and normal heart sounds.   Pulmonary/Chest: Effort normal and breath sounds normal. No respiratory distress.    Neurological: He is alert and oriented to person, place, and time. GCS score is 15.  Skin: Skin is warm and dry.  Psychiatric: Mood, memory, affect and judgment normal.  Vitals reviewed.   Assessment and Plan :  1. Elevated blood pressure reading - EKG 12-Lead - Recheck vitals  2. Essential hypertension 3. Asymptomatic hypertensive urgency - losartan-hydrochlorothiazide (HYZAAR) 100-25 MG tablet; Take 1 tablet by mouth daily.  Dispense: 30 tablet; Refill: 0 - furosemide (LASIX) 40 MG tablet; Take 2 tablets (80 mg total) by mouth daily.  Dispense: 30 tablet; Refill: 3 - CMP14+EGFR - atenolol (TENORMIN) 50 MG tablet; Take 1 tablet (50 mg total) by mouth daily.  Dispense: 30 tablet; Refill: 0 - amLODipine (NORVASC) 10 MG tablet; Take 1 tablet (10 mg total) by mouth daily.  Dispense: 30 tablet; Refill: 0 - Elevated blood pressure - pt has been out of blood pressure medications x >1 week. Pt c/o dizziness, however this is not a new symptom. Will lower blood pressure over several days. Pt's wife is a Marine scientist, she will take blood pressures at home. F/u in 7-10 days for recheck.   4. Dizziness - CBC F/u in one week. Consider cardiology referral if he remains symptomatic.  Of note, pt has "severe sleep apnea" Will inquire about use of CPAP at next appt.   Mercer Pod, PA-C  [facility] 08/13/2016 1:55 PM

## 2016-08-14 LAB — CMP14+EGFR
ALT: 21 IU/L (ref 0–44)
AST: 24 IU/L (ref 0–40)
Albumin/Globulin Ratio: 1.7 (ref 1.2–2.2)
Albumin: 4.3 g/dL (ref 3.6–4.8)
Alkaline Phosphatase: 87 IU/L (ref 39–117)
BUN/Creatinine Ratio: 12 (ref 10–24)
BUN: 13 mg/dL (ref 8–27)
Bilirubin Total: 0.6 mg/dL (ref 0.0–1.2)
CO2: 26 mmol/L (ref 18–29)
Calcium: 9.2 mg/dL (ref 8.6–10.2)
Chloride: 98 mmol/L (ref 96–106)
Creatinine, Ser: 1.1 mg/dL (ref 0.76–1.27)
GFR calc Af Amer: 82 mL/min/{1.73_m2} (ref >59)
GFR calc non Af Amer: 71 mL/min/{1.73_m2} (ref >59)
Globulin, Total: 2.5 g/dL (ref 1.5–4.5)
Glucose: 116 mg/dL — ABNORMAL HIGH (ref 65–99)
Potassium: 3.8 mmol/L (ref 3.5–5.2)
Sodium: 141 mmol/L (ref 134–144)
Total Protein: 6.8 g/dL (ref 6.0–8.5)

## 2016-08-14 LAB — CBC
Hematocrit: 43.8 % (ref 37.5–51.0)
Hemoglobin: 14.5 g/dL (ref 13.0–17.7)
MCH: 30.5 pg (ref 26.6–33.0)
MCHC: 33.1 g/dL (ref 31.5–35.7)
MCV: 92 fL (ref 79–97)
Platelets: 193 10*3/uL (ref 150–379)
RBC: 4.76 x10E6/uL (ref 4.14–5.80)
RDW: 13.9 % (ref 12.3–15.4)
WBC: 6.1 10*3/uL (ref 3.4–10.8)

## 2016-08-20 ENCOUNTER — Ambulatory Visit (INDEPENDENT_AMBULATORY_CARE_PROVIDER_SITE_OTHER): Payer: BC Managed Care – PPO | Admitting: Physician Assistant

## 2016-08-20 VITALS — BP 121/72 | HR 60 | Temp 97.8°F | Resp 18 | Ht 67.0 in | Wt 251.4 lb

## 2016-08-20 DIAGNOSIS — R0602 Shortness of breath: Secondary | ICD-10-CM

## 2016-08-20 DIAGNOSIS — R059 Cough, unspecified: Secondary | ICD-10-CM

## 2016-08-20 DIAGNOSIS — I10 Essential (primary) hypertension: Secondary | ICD-10-CM

## 2016-08-20 DIAGNOSIS — R0981 Nasal congestion: Secondary | ICD-10-CM | POA: Diagnosis not present

## 2016-08-20 DIAGNOSIS — R42 Dizziness and giddiness: Secondary | ICD-10-CM

## 2016-08-20 DIAGNOSIS — R0600 Dyspnea, unspecified: Secondary | ICD-10-CM

## 2016-08-20 DIAGNOSIS — R0609 Other forms of dyspnea: Secondary | ICD-10-CM

## 2016-08-20 DIAGNOSIS — R05 Cough: Secondary | ICD-10-CM | POA: Diagnosis not present

## 2016-08-20 DIAGNOSIS — Z8669 Personal history of other diseases of the nervous system and sense organs: Secondary | ICD-10-CM

## 2016-08-20 MED ORDER — CETIRIZINE HCL 10 MG PO TABS: 10.0000 mg | ORAL_TABLET | Freq: Every day | ORAL | 11 refills | Status: DC

## 2016-08-20 MED ORDER — AMLODIPINE BESYLATE 10 MG PO TABS: 10.0000 mg | ORAL_TABLET | Freq: Every day | ORAL | 5 refills | Status: DC

## 2016-08-20 MED ORDER — LOSARTAN POTASSIUM-HCTZ 100-25 MG PO TABS: 1.0000 | ORAL_TABLET | Freq: Every day | ORAL | 5 refills | Status: DC

## 2016-08-20 MED ORDER — PROMETHAZINE-DM 6.25-15 MG/5ML PO SYRP: 5.0000 mL | ORAL_SOLUTION | Freq: Four times a day (QID) | ORAL | 0 refills | Status: DC | PRN

## 2016-08-20 MED ORDER — FLUTICASONE PROPIONATE 50 MCG/ACT NA SUSP: 2.0000 | Freq: Every day | NASAL | 6 refills | Status: DC

## 2016-08-20 MED ORDER — ATENOLOL 50 MG PO TABS: 50.0000 mg | ORAL_TABLET | Freq: Every day | ORAL | 5 refills | Status: DC

## 2016-08-20 NOTE — Patient Instructions (Addendum)
Follow-up with me in 6 months. Try to start exercising on a regular basis. See below for tip.  Claritin and Flonase are for allergies.  Cardiology will call you to schedule an appointment Neurology will call you to schedule an appointment  Thank you for coming in today. I hope you feel we met your needs.  Feel free to call [facility] if you have any questions or further requests.  Please consider signing up for MyChart if you do not already have it, as this is a great way to communicate with me.  Best,  [employer], PA-C    Exercising to [employer] Exercising can help you to lose weight. In order to lose weight through exercise, you need to do vigorous-intensity exercise. You can tell that you are exercising with vigorous intensity if you are breathing very hard and fast and cannot hold a conversation while exercising. Moderate-intensity exercise helps to maintain your current weight. You can tell that you are exercising at a moderate level if you have a higher heart rate and faster breathing, but you are still able to hold a conversation. How often should I exercise? Choose an activity that you enjoy and set realistic goals. Your health care provider can help you to make an activity plan that works for you. Exercise regularly as directed by your health care provider. This may include:  Doing resistance training twice each week, such as:  Push-ups.  Sit-ups.  Lifting weights.  Using resistance bands.  Doing a given intensity of exercise for a given amount of time. Choose from these options:  150 minutes of moderate-intensity exercise every week.  75 minutes of vigorous-intensity exercise every week.  A mix of moderate-intensity and vigorous-intensity exercise every week. Children, pregnant women, people who are out of shape, people who are overweight, and older adults may need to consult a health care provider for individual recommendations. If you have any sort of medical  condition, be sure to consult your health care provider before starting a new exercise program. What are some activities that can help me to lose weight?  Walking at a rate of at least 4.5 miles an hour.  Jogging or running at a rate of 5 miles per hour.  Biking at a rate of at least 10 miles per hour.  Lap swimming.  Roller-skating or in-line skating.  Cross-country skiing.  Vigorous competitive sports, such as football, basketball, and soccer.  Jumping rope.  Aerobic dancing. How can I be more active in my day-to-day activities?  Use the stairs instead of the elevator.  Take a walk during your lunch break.  If you drive, park your car farther away from work or school.  If you take public transportation, get off one stop early and walk the rest of the way.  Make all of your phone calls while standing up and walking around.  Get up, stretch, and walk around every 30 minutes throughout the day. What guidelines should I follow while exercising?  Do not exercise so much that you hurt yourself, feel dizzy, or get very short of breath.  Consult your health care provider prior to starting a new exercise program.  Wear comfortable clothes and shoes with good support.  Drink plenty of water while you exercise to prevent dehydration or heat stroke. Body water is lost during exercise and must be replaced.  Work out until you breathe faster and your heart beats faster. This information is not intended to replace advice given to you by your  health care provider. Make sure you discuss any questions you have with your health care provider. Document Released: 07/03/2010 Document Revised: 11/06/2015 Document Reviewed: 11/01/2013 Elsevier Interactive Patient Education  2017 [place] DASH stands for "Dietary Approaches to Stop Hypertension." The DASH eating plan is a healthy eating plan that has been shown to reduce high blood pressure (hypertension). It may  also reduce your risk for type 2 diabetes, heart disease, and stroke. The DASH eating plan may also help with weight loss. What are tips for following this plan? General guidelines   Avoid eating more than 2,300 mg (milligrams) of salt (sodium) a day. If you have hypertension, you may need to reduce your sodium intake to 1,500 mg a day.  Limit alcohol intake to no more than 1 drink a day for nonpregnant women and 2 drinks a day for men. One drink equals 12 oz of beer, 5 oz of wine, or 1 oz of hard liquor.  Work with your health care provider to maintain a healthy body weight or to lose weight. Ask what an ideal weight is for you.  Get at least 30 minutes of exercise that causes your heart to beat faster (aerobic exercise) most days of the week. Activities may include walking, swimming, or biking.  Work with your health care provider or diet and nutrition specialist (dietitian) to adjust your eating plan to your individual calorie needs. Reading food labels   Check food labels for the amount of sodium per serving. Choose foods with less than 5 percent of the Daily Value of sodium. Generally, foods with less than 300 mg of sodium per serving fit into this eating plan.  To find whole grains, look for the word "whole" as the first word in the ingredient list. Shopping   Buy products labeled as "low-sodium" or "no salt added."  Buy fresh foods. Avoid canned foods and premade or frozen meals. Cooking   Avoid adding salt when cooking. Use salt-free seasonings or herbs instead of table salt or sea salt. Check with your health care provider or pharmacist before using salt substitutes.  Do not fry foods. Cook foods using healthy methods such as baking, boiling, grilling, and broiling instead.  Cook with heart-healthy oils, such as olive, canola, soybean, or sunflower oil. Meal planning    Eat a balanced diet that includes:  5 or more servings of fruits and vegetables each day. At each meal,  try to fill half of your plate with fruits and vegetables.  Up to 6-8 servings of whole grains each day.  Less than 6 oz of lean meat, poultry, or fish each day. A 3-oz serving of meat is about the same size as a deck of cards. One egg equals 1 oz.  2 servings of low-fat dairy each day.  A serving of nuts, seeds, or beans 5 times each week.  Heart-healthy fats. Healthy fats called Omega-3 fatty acids are found in foods such as flaxseeds and coldwater fish, like sardines, salmon, and mackerel.  Limit how much you eat of the following:  Canned or prepackaged foods.  Food that is high in trans fat, such as fried foods.  Food that is high in saturated fat, such as fatty meat.  Sweets, desserts, sugary drinks, and other foods with added sugar.  Full-fat dairy products.  Do not salt foods before eating.  Try to eat at least 2 vegetarian meals each week.  Eat more home-cooked food and less restaurant, buffet, and fast  food.  When eating at a restaurant, ask that your food be prepared with less salt or no salt, if possible. What foods are recommended? The items listed may not be a complete list. Talk with your dietitian about what dietary choices are best for you. Grains  Whole-grain or whole-wheat bread. Whole-grain or whole-wheat pasta. Brown rice. [place]. Bulgur. Whole-grain and low-sodium cereals. Pita bread. Low-fat, low-sodium crackers. Whole-wheat flour tortillas. Vegetables  Fresh or frozen vegetables (raw, steamed, roasted, or grilled). Low-sodium or reduced-sodium tomato and vegetable juice. Low-sodium or reduced-sodium tomato sauce and tomato paste. Low-sodium or reduced-sodium canned vegetables. Fruits  All fresh, dried, or frozen fruit. Canned fruit in natural juice (without added sugar). Meat and other protein foods  Skinless chicken or [place]. Ground chicken or [place]. Pork with fat trimmed off. Fish and seafood. Egg whites. Dried beans, peas, or lentils.  Unsalted nuts, nut butters, and seeds. Unsalted canned beans. Lean cuts of beef with fat trimmed off. Low-sodium, lean deli meat. Dairy  Low-fat (1%) or fat-free (skim) milk. Fat-free, low-fat, or reduced-fat cheeses. Nonfat, low-sodium ricotta or cottage cheese. Low-fat or nonfat yogurt. Low-fat, low-sodium cheese. Fats and oils  Soft margarine without trans fats. Vegetable oil. Low-fat, reduced-fat, or light mayonnaise and salad dressings (reduced-sodium). Canola, safflower, olive, soybean, and sunflower oils. Avocado. Seasoning and other foods  Herbs. Spices. Seasoning mixes without salt. Unsalted popcorn and pretzels. Fat-free sweets. What foods are not recommended? The items listed may not be a complete list. Talk with your dietitian about what dietary choices are best for you. Grains  Baked goods made with fat, such as croissants, muffins, or some breads. Dry pasta or rice meal packs. Vegetables  Creamed or fried vegetables. Vegetables in a cheese sauce. Regular canned vegetables (not low-sodium or reduced-sodium). Regular canned tomato sauce and paste (not low-sodium or reduced-sodium). Regular tomato and vegetable juice (not low-sodium or reduced-sodium). Angie Fava. Olives. Fruits  Canned fruit in a light or heavy syrup. Fried fruit. Fruit in cream or butter sauce. Meat and other protein foods  Fatty cuts of meat. Ribs. Fried meat. Berniece Salines. Sausage. Bologna and other processed lunch meats. Salami. Fatback. Hotdogs. Bratwurst. Salted nuts and seeds. Canned beans with added salt. Canned or smoked fish. Whole eggs or egg yolks. Chicken or [place] with skin. Dairy  Whole or 2% milk, cream, and half-and-half. Whole or full-fat cream cheese. Whole-fat or sweetened yogurt. Full-fat cheese. Nondairy creamers. Whipped toppings. Processed cheese and cheese spreads. Fats and oils  Butter. Stick margarine. Lard. Shortening. Ghee. Bacon fat. Tropical oils, such as coconut, palm kernel, or palm  oil. Seasoning and other foods  Salted popcorn and pretzels. Onion salt, garlic salt, seasoned salt, table salt, and sea salt. Worcestershire sauce. Tartar sauce. Barbecue sauce. Teriyaki sauce. Soy sauce, including reduced-sodium. Steak sauce. Canned and packaged gravies. Fish sauce. Oyster sauce. Cocktail sauce. Horseradish that you find on the shelf. Ketchup. Mustard. Meat flavorings and tenderizers. Bouillon cubes. Hot sauce and Tabasco sauce. Premade or packaged marinades. Premade or packaged taco seasonings. Relishes. Regular salad dressings. Where to find more information:  National Heart, Lung, and [place]: https://wilson-eaton.com/  American Heart Association: www.heart.org Summary  The DASH eating plan is a healthy eating plan that has been shown to reduce high blood pressure (hypertension). It may also reduce your risk for type 2 diabetes, heart disease, and stroke.  With the DASH eating plan, you should limit salt (sodium) intake to 2,300 mg a day. If you have hypertension, you may need to  reduce your sodium intake to 1,500 mg a day.  When on the DASH eating plan, aim to eat more fresh fruits and vegetables, whole grains, lean proteins, low-fat dairy, and heart-healthy fats.  Work with your health care provider or diet and nutrition specialist (dietitian) to adjust your eating plan to your individual calorie needs. This information is not intended to replace advice given to you by your health care provider. Make sure you discuss any questions you have with your health care provider. Document Released: 05/20/2011 Document Revised: 05/24/2016 Document Reviewed: 05/24/2016 Elsevier Interactive Patient Education  2017 [employer].  IF you received an x-ray today, you will receive an invoice from [facility]. Please contact [facility] at [phone] with questions or concerns regarding your invoice.   IF you received labwork today, you will receive an invoice from  [place]. Please contact [facility] at [phone] with questions or concerns regarding your invoice.   Our billing staff will not be able to assist you with questions regarding bills from these companies.  You will be contacted with the lab results as soon as they are available. The fastest way to get your results is to activate your My Chart account. Instructions are located on the last page of this paperwork. If you have not heard from [place] regarding the results in 2 weeks, please contact this office.

## 2016-08-20 NOTE — Progress Notes (Signed)
Jeffrey Parks  MRN: [id number] DOB: 1951-06-02  PCP: Jenny Reichmann, MD  Subjective:  Pt is a pleasant 66 year old male who presents to clinic for blood pressure recheck. 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  He was here one week ago with pressures in 180/110. At that time, he had been out of his blood pressure medications x 1 week.   HTN - Today's pressure is 121/72. Hyzaar 100-25mg , Norvasc 10mg , and Atenolol 50mg .    C/o dizziness - this is a chronic problem. He describes it as "feeling off balance" and running into things. Sometimes happens when he stands from a seated position. Sometimes this happens when he is already up. Dizziness happens every day. Denies syncope, dizziness with change of head position, lightheadedness, headache.  Fhx - Mother - HTN.   C/o Blurry vision - comes and goes. This occurs every day.  Endorses SOB when climbs stairs - he can climb about 10 stairs. Worse in the past 6 months.   Evaluated by cardiology 07/2015 - Exercise tolerance test: no ECG evidence of exercise induced ischemia. Poor exercise tolerance, limited by dyspnea and knee pain.   C/o Cough - Worse at night. +sneezing and runny nose. This is a chronic problem.   Review of Systems  Constitutional: Negative for chills, diaphoresis and fever.  HENT: Positive for sneezing.   Eyes: Positive for visual disturbance.  Respiratory: Positive for cough and shortness of breath. Negative for chest tightness and wheezing.   Cardiovascular: Positive for leg swelling. Negative for chest pain and palpitations.  Gastrointestinal: Negative for diarrhea, nausea and vomiting.  Neurological: Positive for dizziness. Negative for syncope, facial asymmetry, weakness, light-headedness, numbness and headaches.  Psychiatric/Behavioral: Negative for sleep disturbance. The patient is not nervous/anxious.     Patient Active Problem List   Diagnosis Date Noted  . Fatigue 05/30/2015  . Hypogonadism male 05/22/2015  .  Screening for prostate cancer 05/22/2015  . Snoring 05/22/2015  . Morbid obesity due to excess calories ([place]) 05/22/2015  . 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 Essential hypertension 05/07/2015  . Hyperglycemia 05/07/2015  . Erectile dysfunction 05/07/2015    Current Outpatient Prescriptions on File Prior to Visit  Medication Sig Dispense Refill  . acetaminophen (TYLENOL ARTHRITIS PAIN) 650 MG CR tablet Take 1 tablet (650 mg total) by mouth every 6 (six) hours as needed for pain. 30 tablet 0  . amLODipine (NORVASC) 10 MG tablet Take 1 tablet (10 mg total) by mouth daily. 30 tablet 0  . atenolol (TENORMIN) 50 MG tablet Take 1 tablet (50 mg total) by mouth daily. 30 tablet 0  . carvedilol (COREG) 6.25 MG tablet Take 1 tablet (6.25 mg total) by mouth 2 (two) times daily with a meal. 60 tablet 3  . diclofenac sodium (VOLTAREN) 1 % GEL Apply 4 g topically 4 (four) times daily. 100 g 1  . furosemide (LASIX) 40 MG tablet Take 2 tablets (80 mg total) by mouth daily. 30 tablet 3  . losartan-hydrochlorothiazide (HYZAAR) 100-25 MG tablet Take 1 tablet by mouth daily. 30 tablet 0  . meloxicam (MOBIC) 15 MG tablet Take 1 tablet (15 mg total) by mouth daily. 30 tablet 1  . naproxen (NAPROSYN) 500 MG tablet Take 1 tablet (500 mg total) by mouth 2 (two) times daily with a meal. 20 tablet 0  . phentermine 15 MG capsule Take 1 capsule (15 mg total) by mouth every other day. 15 capsule 0  . polyethylene glycol powder (GLYCOLAX/MIRALAX) powder Take 17 g  by mouth 2 (two) times daily as needed. 578 g 1   No current facility-administered medications on file prior to visit.     No Known Allergies   Objective:  BP 121/72   Pulse 60   Temp 97.8 F (36.6 C) (Oral)   Resp 18   Ht 5\' 7"  (1.702 m)   Wt 251 lb 6.4 oz (114 kg)   SpO2 98%   BMI 39.37 kg/m   Physical Exam  Constitutional: He is oriented to person, place, and time and well-developed, well-nourished, and in no distress. No distress.    Neck: No JVD present.  Cardiovascular: Normal rate, regular rhythm, normal heart sounds and normal pulses.   B/l LES edema  Pulmonary/Chest: Effort normal. No respiratory distress.  Neurological: He is alert and oriented to person, place, and time. He has normal sensation, normal strength and intact cranial nerves. He has a normal Heel to [employer]. He shows no pronator drift. GCS score is 15.  Skin: Skin is warm and dry.  Psychiatric: Mood, memory, affect and judgment normal.  Vitals reviewed.   Assessment and Plan :  1. Essential hypertension - amLODipine (NORVASC) 10 MG tablet; Take 1 tablet (10 mg total) by mouth daily.  Dispense: 30 tablet; Refill: 5 - atenolol (TENORMIN) 50 MG tablet; Take 1 tablet (50 mg total) by mouth daily.  Dispense: 30 tablet; Refill: 5 - losartan-hydrochlorothiazide (HYZAAR) 100-25 MG tablet; Take 1 tablet by mouth daily.  Dispense: 30 tablet; Refill: 5 - Blood pressure is controlled today at 121/72, this is down from his last OV 184/110. He still c/o dizziness and loss of balance. These symptoms happen every day and have worsened over the past 6 months. CBC and CMP wnl.  Mr. Behrens has not had lab work in over a year. Advised him to make appt for annual exam in the next few months.   2. DOE (dyspnea on exertion) 3. SOB (shortness of breath) 4. Dizziness 5. History of blurry vision - Ambulatory referral to Cardiology - Ambulatory referral to Neurology - Worsening DOE over the past 6 months.  - Neuro exam is negative. Will refer to neurology for work-up of dizziness.   6. Cough 7. Nasal congestion - promethazine-dextromethorphan (PROMETHAZINE-DM) 6.25-15 MG/5ML syrup; Take 5 mLs by mouth 4 (four) times daily as needed for cough.  Dispense: 118 mL; Refill: 0 - fluticasone (FLONASE) 50 MCG/ACT nasal spray; Place 2 sprays into both nostrils daily.  Dispense: 16 g; Refill: 6 - cetirizine (ZYRTEC) 10 MG tablet; Take 1 tablet (10 mg total) by mouth daily.   Dispense: 30 tablet; Refill: 11   Whitney Makenzey Nanni, PA-C  [facility] 08/20/2016 2:00 PM

## 2016-08-25 ENCOUNTER — Encounter: Payer: Self-pay | Admitting: Neurology

## 2016-09-02 ENCOUNTER — Telehealth: Payer: Self-pay | Admitting: Physician Assistant

## 2016-09-02 NOTE — Telephone Encounter (Signed)
PATIENT WANTS TO TALK WITH WHITNEY MCVEY. HE WOULD NOT SAY WHAT IT WAS REGARDING. BEST PHONE [phone]) [place]

## 2016-09-07 NOTE — Progress Notes (Signed)
Cardiology Office Note   Date:  09/08/2016   ID:  Jeffrey Parks [birth year], MRN [id number]  PCP:  Jeffrey Hiss, PA-C  Cardiologist:   Jeffrey Breeding, MD   Chief Complaint  Patient presents with  . Edema      History of Present Illness: Jeffrey Parks is a 67 y.o. male who presents for evaluation of  I saw him in the past and he had a history of dyspnea and fatigue.  POET (Plain Old Exercise Treadmill) was negative for ischemia in 2016.      Since I last saw him he has been diagnosed with sleep apnea.  I see that a follow-up was suggested for CPAP titration but he decided not to have this because he says he sleeps fine. However, today he comes back with his predominant complaint being fatigue. He does have dizziness which she says is all the time. She's not describing orthostatic symptoms. Really describing palpitations, presyncope or syncope. He's not had any chest pressure, neck or arm discomfort. He does have some dyspnea with exertion but this is unchanged from previous. He's not describing PND or orthopnea. He does have lower externally swelling which she says comes and goes.   Past Medical History:  Diagnosis Date  . Hypertension   . Low testosterone   . Sickle cell trait [facility])     Past Surgical History:  Procedure Laterality Date  . None       Current Outpatient Prescriptions  Medication Sig Dispense Refill  . acetaminophen (TYLENOL ARTHRITIS PAIN) 650 MG CR tablet Take 1 tablet (650 mg total) by mouth every 6 (six) hours as needed for pain. 30 tablet 0  . amLODipine (NORVASC) 10 MG tablet Take 1 tablet (10 mg total) by mouth daily. 30 tablet 5  . atenolol (TENORMIN) 50 MG tablet Take 1 tablet (50 mg total) by mouth daily. 30 tablet 5  . cetirizine (ZYRTEC) 10 MG tablet Take 1 tablet (10 mg total) by mouth daily. 30 tablet 11  . diclofenac sodium (VOLTAREN) 1 % GEL Apply 4 g topically 4 (four) times daily. 100 g 1  . fluticasone (FLONASE) 50  MCG/ACT nasal spray Place 2 sprays into both nostrils daily. 16 g 6  . furosemide (LASIX) 40 MG tablet Take 2 tablets (80 mg total) by mouth daily. 30 tablet 3  . losartan-hydrochlorothiazide (HYZAAR) 100-25 MG tablet Take 1 tablet by mouth daily. 30 tablet 5  . meloxicam (MOBIC) 15 MG tablet Take 1 tablet (15 mg total) by mouth daily. 30 tablet 1  . naproxen (NAPROSYN) 500 MG tablet Take 1 tablet (500 mg total) by mouth 2 (two) times daily with a meal. 20 tablet 0  . phentermine 15 MG capsule Take 1 capsule (15 mg total) by mouth every other day. 15 capsule 0  . polyethylene glycol powder (GLYCOLAX/MIRALAX) powder Take 17 g by mouth 2 (two) times daily as needed. 578 g 1  . promethazine-dextromethorphan (PROMETHAZINE-DM) 6.25-15 MG/5ML syrup Take 5 mLs by mouth 4 (four) times daily as needed for cough. 118 mL 0   No current facility-administered medications for this visit.     Allergies:   Patient has no known allergies.    Social History:  The patient  reports that he has quit smoking. His smoking use included Cigarettes. He has never used smokeless tobacco. He reports that he drinks about 1.8 oz of alcohol per week . He reports that he does not use drugs.   Family  History:  The patient's family history includes Hypertension in his mother; Sickle cell anemia in his son and son.    ROS:  Please see the history of present illness.   Otherwise, review of systems are positive for none.   All other systems are reviewed and negative.    PHYSICAL EXAM: VS:  BP 122/84   Pulse 73   Ht 5\' 7"  (1.702 m)   Wt 256 lb (116.1 kg)   BMI 40.10 kg/m  , BMI Body mass index is 40.1 kg/m. GENERAL:  Well appearing HEENT:  Pupils equal round and reactive, fundi not visualized, oral mucosa unremarkable NECK:  No jugular venous distention, waveform within normal limits, carotid upstroke brisk and symmetric, no bruits, no thyromegaly LYMPHATICS:  No cervical, inguinal adenopathy LUNGS:  Clear to  auscultation bilaterally BACK:  No CVA tenderness CHEST:  Unremarkable HEART:  PMI not displaced or sustained,S1 and S2 within normal limits, no S3, no S4, no clicks, no rubs, no murmurs ABD:  Flat, positive bowel sounds normal in frequency in pitch, no bruits, no rebound, no guarding, no midline pulsatile mass, no hepatomegaly, no splenomegaly EXT:  2 plus pulses throughout, no edema, no cyanosis no clubbing SKIN:  No rashes no nodules NEURO:  Cranial nerves II through XII grossly intact, motor grossly intact throughout PSYCH:  Cognitively intact, oriented to person place and time    EKG:  EKG is not  ordered today. The ekg ordered 05/03/16 demonstrates  Sinus rhythm, rate 68, leftward axis , early transition in lead V2 , intervals within normal limits, nonspecific T-wave flattening.   Recent Labs: 05/03/2016: Brain Natriuretic Peptide 22.3; Hemoglobin 13.9 08/13/2016: ALT 21; BUN 13; Creatinine, Ser 1.10; Platelets 193; Potassium 3.8; Sodium 141   Lab Results  Component Value Date   CHOL 181 05/30/2015   TRIG 86 05/30/2015   HDL 49 05/30/2015   LDLCALC 115 05/30/2015    Lipid Panel    Component Value Date/Time   CHOL 181 05/30/2015 1422   TRIG 86 05/30/2015 1422   HDL 49 05/30/2015 1422   CHOLHDL 3.7 05/30/2015 1422   VLDL 17 05/30/2015 1422   LDLCALC 115 05/30/2015 1422      Wt Readings from Last 3 Encounters:  09/08/16 256 lb (116.1 kg)  08/20/16 251 lb 6.4 oz (114 kg)  08/13/16 256 lb 9.6 oz (116.4 kg)      Other studies Reviewed: Additional studies/ records that were reviewed today include: Sleep report Review of the above records demonstrates:     ASSESSMENT AND PLAN:  FATIGUE:   I discussed with him the importance of follow-up for his sleep apnea and sent a note to the neurology office. I told him that if he does have untreated sleep apnea we can't separate his fatigue from this diagnosis without treatment. At this point no other cardiac workup this is  planned.  DYSPNEA:   He continues to have this along with the lower 70 swelling. I'm going to check an echocardiogram.   SLEEP APNEA:  As above.  I have referred him back to neurology.   DIZZINESS:  I will check a 24 hour Holter.     Current medicines are reviewed at length with the patient today.  The patient does not have concerns regarding medicines.  The following changes have been made:  None  Labs/ tests ordered today include:    Orders Placed This Encounter  Procedures  . Holter monitor - 24 hour  . ECHOCARDIOGRAM COMPLETE  Disposition:   FU with me after the above studies.      Signed, Jeffrey Breeding, MD  09/08/2016 2:25 PM     [facility]

## 2016-09-08 ENCOUNTER — Encounter: Payer: Self-pay | Admitting: Cardiology

## 2016-09-08 ENCOUNTER — Ambulatory Visit (INDEPENDENT_AMBULATORY_CARE_PROVIDER_SITE_OTHER): Payer: BC Managed Care – PPO | Admitting: Cardiology

## 2016-09-08 VITALS — BP 122/84 | HR 73 | Ht 67.0 in | Wt 256.0 lb

## 2016-09-08 DIAGNOSIS — R5383 Other fatigue: Secondary | ICD-10-CM

## 2016-09-08 DIAGNOSIS — R42 Dizziness and giddiness: Secondary | ICD-10-CM | POA: Insufficient documentation

## 2016-09-08 DIAGNOSIS — G4730 Sleep apnea, unspecified: Secondary | ICD-10-CM

## 2016-09-08 DIAGNOSIS — M7989 Other specified soft tissue disorders: Secondary | ICD-10-CM | POA: Diagnosis not present

## 2016-09-08 NOTE — Patient Instructions (Addendum)
Medication Instructions:  Continue current medications  Labwork: None Ordered  Testing/Procedures: Your physician has recommended that you wear a 24 hour holter monitor. Holter monitors are medical devices that record the heart's electrical activity. Doctors most often use these monitors to diagnose arrhythmias. Arrhythmias are problems with the speed or rhythm of the heartbeat. The monitor is a small, portable device. You can wear one while you do your normal daily activities. This is usually used to diagnose what is causing palpitations/syncope (passing out).  Your physician has requested that you have an echocardiogram. Echocardiography is a painless test that uses sound waves to create images of your heart. It provides your doctor with information about the size and shape of your heart and how well your heart's chambers and valves are working. This procedure takes approximately one hour. There are no restrictions for this procedure.   Follow-Up: Your physician recommends that you schedule a follow-up appointment in: After Test   Any Other Special Instructions Will Be Listed Below (If Applicable).   If you need a refill on your cardiac medications before your next appointment, please call your pharmacy.

## 2016-09-09 ENCOUNTER — Telehealth: Payer: Self-pay | Admitting: Neurology

## 2016-09-09 NOTE — Telephone Encounter (Signed)
Minus Breeding, MD  Larey Seat, MD        Hi, Can you look at this sleep study from your office last year. He never followed up. Can you call him to follow up.     Hi Jake,  This Patient tested positive for severe sleep apnea with an AHI of over 50/hr sleep. I will inform my clinic staff of his need for follow up. Ordering physician was Dr Everlene Farrier at the time, 08-11-2015.   Larey Seat, MD

## 2016-09-13 ENCOUNTER — Other Ambulatory Visit: Payer: Self-pay | Admitting: Physician Assistant

## 2016-09-13 ENCOUNTER — Telehealth: Payer: Self-pay | Admitting: Physician Assistant

## 2016-09-13 DIAGNOSIS — I10 Essential (primary) hypertension: Secondary | ICD-10-CM

## 2016-09-13 DIAGNOSIS — I160 Hypertensive urgency: Secondary | ICD-10-CM

## 2016-09-13 NOTE — Telephone Encounter (Signed)
Patient called in stating that he is taking amLODipine (NORVASC) 10 MG tablet and is having some issues with his legs and doesn't want to take this anymore wants something different.  He also states he needs his atenolol (TENORMIN) 50 MG tablet refilled he states he normally uses [facility] but he wants it sent to [facility] on the corner of [address] and [address] instead.  His call back number is [phone]

## 2016-09-15 ENCOUNTER — Other Ambulatory Visit: Payer: Self-pay | Admitting: Family Medicine

## 2016-09-15 DIAGNOSIS — I10 Essential (primary) hypertension: Secondary | ICD-10-CM

## 2016-09-16 MED ORDER — ATENOLOL 50 MG PO TABS: 50.0000 mg | ORAL_TABLET | Freq: Every day | ORAL | 5 refills | Status: DC

## 2016-09-16 NOTE — Telephone Encounter (Signed)
Atenolol sent, lmtcb about amlodipine

## 2016-09-18 NOTE — Telephone Encounter (Addendum)
Stop amlodipine. He is already on 2 other blood pressure medications that are very good for BP. We cannot prescribe additional medications without a re-evaluation. Take benadryl 25-50mg  for itching, rashes which may be signs of an allergic reaction. RTC this upcoming week for a recheck. If symptoms of facial swelling, fever, chest tightness, shob develop, he needs to go to ER.  BP Readings from Last 3 Encounters:  09/08/16 122/84  08/20/16 121/72  08/13/16 (!) 184/110

## 2016-09-18 NOTE — Telephone Encounter (Signed)
Pt states the amlodipine is making his feet and legs swollen, NO trouble breathing. Started when started this medicine. "I am allergic and want something else" Fatigue and body aches too. Last dose 09/17/16

## 2016-09-18 NOTE — Telephone Encounter (Signed)
l/m with manis note details

## 2016-09-18 NOTE — Telephone Encounter (Signed)
Called back and confirmed he got message

## 2016-09-20 ENCOUNTER — Ambulatory Visit (INDEPENDENT_AMBULATORY_CARE_PROVIDER_SITE_OTHER): Payer: BC Managed Care – PPO | Admitting: Physician Assistant

## 2016-09-20 ENCOUNTER — Encounter: Payer: Self-pay | Admitting: Physician Assistant

## 2016-09-20 VITALS — BP 122/84 | HR 64 | Temp 98.7°F | Resp 16 | Ht 67.0 in | Wt 257.6 lb

## 2016-09-20 DIAGNOSIS — R609 Edema, unspecified: Secondary | ICD-10-CM | POA: Diagnosis not present

## 2016-09-20 DIAGNOSIS — I10 Essential (primary) hypertension: Secondary | ICD-10-CM | POA: Diagnosis not present

## 2016-09-20 DIAGNOSIS — R5383 Other fatigue: Secondary | ICD-10-CM | POA: Diagnosis not present

## 2016-09-20 DIAGNOSIS — R030 Elevated blood-pressure reading, without diagnosis of hypertension: Secondary | ICD-10-CM

## 2016-09-20 DIAGNOSIS — R600 Localized edema: Secondary | ICD-10-CM

## 2016-09-20 NOTE — Patient Instructions (Addendum)
Continue taking Atenolol 34m and Hyzaar 100-263mSTOP taking Norvasc.  Continue Lasix 4063mReduce this dose if the swelling in your legs improves.   Please take home blood pressures and write them down on a piece of paper. Bring them with you and come back and see me in 1 week.  Thank you for coming in today. I hope you feel we met your needs.  Feel free to call [facility] if you have any questions or further requests.  Please consider signing up for MyChart if you do not already have it, as this is a great way to communicate with me.  Best,  Whitney McVey, PA-C  IF you received an x-ray today, you will receive an invoice from [facility]. Please contact [facility] at [phone] questions or concerns regarding your invoice.   IF you received labwork today, you will receive an invoice from [facility] contact [facility] at [phone] questions or concerns regarding your invoice.   Our billing staff will not be able to assist you with questions regarding bills from these companies.  You will be contacted with the lab results as soon as they are available. The fastest way to get your results is to activate your My Chart account. Instructions are located on the last page of this paperwork. If you have not heard from us [place] the results in 2 weeks, please contact this office.

## 2016-09-20 NOTE — Progress Notes (Signed)
Jeffrey Parks  MRN: [id number] DOB: Aug 27, 1950  PCP: Gelene Mink Trent Theisen, PA-C  Subjective:  Pt is a pleasant 66 year old male PMH HTN, sleep apnea, obesity who presents to clinic for HTN f/u.   H/o dizziness and fatigue. He was referred to cardiology at his last OV. Today is his first f/u following his cardiology evaluation 08/2016  He had an echo done 09/2016. - NL LV function and no evidence of increased pulmonary pressures. No clear indication for his lower extremity swelling. LV EF: 60% -   65%  H/o HTN - Norvasc, atenolol, Hyzaar. Today's blood pressure is 150/96. Repeat is 122/84.  Denies facial swelling, fever, chest tightness, shob.  C/o lower externally swelling up to his knees - comes and goes.  H/o sleep apnea - He never followed-up for his sleep apnea with neurology. Says "I sleep fine". Denies snoring.    Review of Systems  Constitutional: Positive for fatigue. Negative for diaphoresis.  Respiratory: Negative for cough, chest tightness, shortness of breath and wheezing.   Cardiovascular: Positive for leg swelling. Negative for chest pain and palpitations.  Gastrointestinal: Negative for diarrhea, nausea and vomiting.  Neurological: Positive for dizziness. Negative for syncope, light-headedness and headaches.  Psychiatric/Behavioral: Negative for sleep disturbance. The patient is not nervous/anxious.     Patient Active Problem List   Diagnosis Date Noted  . Leg swelling 09/08/2016  . Sleep apnea 09/08/2016  . Dizziness 09/08/2016  . Fatigue 05/30/2015  . Hypogonadism male 05/22/2015  . Screening for prostate cancer 05/22/2015  . Snoring 05/22/2015  . Morbid obesity due to excess calories ([place]) 05/22/2015  . 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 Essential hypertension 05/07/2015  . Hyperglycemia 05/07/2015  . Erectile dysfunction 05/07/2015    Current Outpatient Prescriptions on File Prior to Visit  Medication Sig Dispense Refill  . acetaminophen  (TYLENOL ARTHRITIS PAIN) 650 MG CR tablet Take 1 tablet (650 mg total) by mouth every 6 (six) hours as needed for pain. 30 tablet 0  . atenolol (TENORMIN) 50 MG tablet TAKE 1 TABLET BY MOUTH ONCE DAILY 90 tablet 0  . atenolol (TENORMIN) 50 MG tablet Take 1 tablet (50 mg total) by mouth daily. 30 tablet 5  . cetirizine (ZYRTEC) 10 MG tablet Take 1 tablet (10 mg total) by mouth daily. 30 tablet 11  . diclofenac sodium (VOLTAREN) 1 % GEL Apply 4 g topically 4 (four) times daily. 100 g 1  . fluticasone (FLONASE) 50 MCG/ACT nasal spray Place 2 sprays into both nostrils daily. 16 g 6  . furosemide (LASIX) 40 MG tablet Take 2 tablets (80 mg total) by mouth daily. 30 tablet 3  . meloxicam (MOBIC) 15 MG tablet Take 1 tablet (15 mg total) by mouth daily. 30 tablet 1  . naproxen (NAPROSYN) 500 MG tablet Take 1 tablet (500 mg total) by mouth 2 (two) times daily with a meal. 20 tablet 0  . polyethylene glycol powder (GLYCOLAX/MIRALAX) powder Take 17 g by mouth 2 (two) times daily as needed. 578 g 1  . amLODipine (NORVASC) 10 MG tablet Take 1 tablet (10 mg total) by mouth daily. (Patient not taking: Reported on 09/20/2016) 30 tablet 5  . amLODipine (NORVASC) 10 MG tablet TAKE 1 TABLET BY MOUTH ONCE DAILY (Patient not taking: Reported on 09/20/2016) 90 tablet 0  . losartan-hydrochlorothiazide (HYZAAR) 100-25 MG tablet Take 1 tablet by mouth daily. (Patient not taking: Reported on 09/20/2016) 30 tablet 5  . phentermine 15 MG capsule Take 1 capsule (15 mg  total) by mouth every other day. (Patient not taking: Reported on 09/20/2016) 15 capsule 0  . promethazine-dextromethorphan (PROMETHAZINE-DM) 6.25-15 MG/5ML syrup Take 5 mLs by mouth 4 (four) times daily as needed for cough. (Patient not taking: Reported on 09/20/2016) 118 mL 0   No current facility-administered medications on file prior to visit.     No Known Allergies   Objective:  BP (!) 150/96 (BP Location: Right Arm, Patient Position: Sitting, Cuff Size: Large)    Pulse 64   Temp 98.7 F (37.1 C) (Oral)   Resp 16   Ht 5\' 7"  (1.702 m)   Wt 257 lb 9.6 oz (116.8 kg)   SpO2 97%   BMI 40.35 kg/m   Physical Exam  Constitutional: He is oriented to person, place, and time and well-developed, well-nourished, and in no distress. No distress.  Cardiovascular: Normal rate, regular rhythm and normal heart sounds.   Pulmonary/Chest: Effort normal. No respiratory distress.  Musculoskeletal:       Right lower leg: He exhibits edema (2+ to knee).       Left lower leg: He exhibits edema (2+ to knee).  Neurological: He is alert and oriented to person, place, and time. GCS score is 15.  Skin: Skin is warm and dry.  Psychiatric: Mood, memory, affect and judgment normal.  Vitals reviewed.  09/2016 Echocardiogram - NL LV function and no evidence of increased pulmonary pressures. No clear indication for his lower extremity swelling. LV EF: 60% -   65%  POET (Plain Old Exercise Treadmill) 2016 was negative for ischemia.  Assessment and Plan :  1. Essential hypertension 2. Elevated blood pressure reading 3. Fatigue, unspecified type 4. Peripheral edema - Recheck vitals - Pt was evaluated by cardiology last month. Negative echocardiogram. Plan for 24 hour Holter monitor.  - Cardiology sent neurology a note to f/u with pt. He never f/u for OSA. "I sleep fine". - Will stop Norvasc - his symptoms of dizziness and fatigue could be medication side effect. He reports being on this medication x 10 years. Repeat blood pressure is wnl. Will not make any other medication adjustments at this time. RTC in 1 week for bp recheck. He understands and agrees with plan.   Mercer Pod, PA-C  [facility] [place] 09/20/2016 12:34 PM

## 2016-09-21 ENCOUNTER — Ambulatory Visit (INDEPENDENT_AMBULATORY_CARE_PROVIDER_SITE_OTHER): Payer: BC Managed Care – PPO

## 2016-09-21 ENCOUNTER — Telehealth: Payer: Self-pay | Admitting: Neurology

## 2016-09-21 ENCOUNTER — Other Ambulatory Visit: Payer: Self-pay | Admitting: Cardiology

## 2016-09-21 DIAGNOSIS — R5383 Other fatigue: Secondary | ICD-10-CM | POA: Diagnosis not present

## 2016-09-21 DIAGNOSIS — R42 Dizziness and giddiness: Secondary | ICD-10-CM | POA: Diagnosis not present

## 2016-09-21 DIAGNOSIS — G4733 Obstructive sleep apnea (adult) (pediatric): Secondary | ICD-10-CM

## 2016-09-21 NOTE — Telephone Encounter (Signed)
Order is now in.

## 2016-09-21 NOTE — Telephone Encounter (Signed)
I have instructions to schedule this patient for CPAP but I need an order.  It has expired on my workqueue.

## 2016-09-22 ENCOUNTER — Other Ambulatory Visit: Payer: Self-pay

## 2016-09-22 ENCOUNTER — Ambulatory Visit (HOSPITAL_COMMUNITY): Payer: BC Managed Care – PPO | Attending: Cardiology

## 2016-09-22 DIAGNOSIS — I700 Atherosclerosis of aorta: Secondary | ICD-10-CM | POA: Insufficient documentation

## 2016-09-22 DIAGNOSIS — R5383 Other fatigue: Secondary | ICD-10-CM | POA: Insufficient documentation

## 2016-09-22 DIAGNOSIS — M7989 Other specified soft tissue disorders: Secondary | ICD-10-CM | POA: Diagnosis not present

## 2016-09-27 ENCOUNTER — Encounter: Payer: Self-pay | Admitting: Physician Assistant

## 2016-09-27 ENCOUNTER — Telehealth: Payer: Self-pay | Admitting: Physician Assistant

## 2016-09-27 ENCOUNTER — Ambulatory Visit (INDEPENDENT_AMBULATORY_CARE_PROVIDER_SITE_OTHER): Payer: BC Managed Care – PPO | Admitting: Physician Assistant

## 2016-09-27 VITALS — BP 151/98 | HR 60 | Temp 98.0°F | Resp 16 | Ht 67.0 in | Wt 258.0 lb

## 2016-09-27 DIAGNOSIS — H9193 Unspecified hearing loss, bilateral: Secondary | ICD-10-CM | POA: Diagnosis not present

## 2016-09-27 DIAGNOSIS — R42 Dizziness and giddiness: Secondary | ICD-10-CM | POA: Diagnosis not present

## 2016-09-27 DIAGNOSIS — R030 Elevated blood-pressure reading, without diagnosis of hypertension: Secondary | ICD-10-CM | POA: Diagnosis not present

## 2016-09-27 DIAGNOSIS — I10 Essential (primary) hypertension: Secondary | ICD-10-CM | POA: Diagnosis not present

## 2016-09-27 DIAGNOSIS — R600 Localized edema: Secondary | ICD-10-CM

## 2016-09-27 MED ORDER — ATENOLOL 100 MG PO TABS: 100.0000 mg | ORAL_TABLET | Freq: Every day | ORAL | 3 refills | Status: DC

## 2016-09-27 NOTE — Patient Instructions (Addendum)
Please start wearing your compression socks to help with leg swelling. This may also help reduce your fatigue.  Elevate your legs at the end of the day - or whenever they are feeling swollen. This will help reduce your swelling.  Continue exercising. See below for DASH diet to help reduce your blood pressure.   Continue taking Hyzaar 100-25mg  daily. START atenolol 100mg  daily.  Please take a few blood pressure measurements at your house. Bring them with you to your next appt. Come back and see me in 3-4 weeks.   You are approved for sleep study - they will call you to schedule an appt.  Audiology will call you to set up appt for your decreased hearing.   Please try these following measures before we send you to ENT. By doing these measures, we are treating you for Orthostatic Hypotension  Lifestyle modification-Patient education is very important for the management of chronic orthostatic hypotension. Throughout the day, patients are subject to a number of orthostatic demands for which there are simple and effective countermeasures. As a result, the time spent emphasizing practical management principles is of inestimable value. These measures include:  - Exercising: an exercise regimen consisting of walking or climbing stairs for 30 to 45 minutes per day three times per week  - The use of custom-fitted elastic stockings permits the application of graded pressure to the lower extremities and lower abdomen, thereby minimizing peripheral blood pooling. - Raising the head of the bed 10 to 20 degrees - Arising slowly, in stages, from supine to seated to standing. This maneuver is most important in the morning, when orthostatic tolerance is lowest. - Avoiding straining, coughing, and walking in hot weather; these activities reduce venous return and worsen orthostatic hypotension. - Maintaining hydration and avoiding over-heating.  The reduction in central blood volume associated with autonomic  insufficiency. The effect of water is greatest in the hour after ingestion. In addition to drinking water during meals and before exercise, some clinicians advise keeping a pitcher of water at the bedside and drinking rapidly before getting out of bed in the morning. A target daily ingestion of 1.5 to 3 L per day is recommended by some clinicians. (After a meal, blood pressure declined less in patients who drank water during the meal than in patients who did not.)  Physical maneuvers-Specific physical maneuvers appear to be helpful in some patients. One maneuver that may be effective is tensing the legs by crossing them while actively standing on both legs. Tensing lower body muscles in the legs, buttocks, and abdomen before arising from a squat position    DASH Eating Plan DASH stands for "Dietary Approaches to Stop Hypertension." The DASH eating plan is a healthy eating plan that has been shown to reduce high blood pressure (hypertension). It may also reduce your risk for type 2 diabetes, heart disease, and stroke. The DASH eating plan may also help with weight loss. What are tips for following this plan? General guidelines   Avoid eating more than 2,300 mg (milligrams) of salt (sodium) a day. If you have hypertension, you may need to reduce your sodium intake to 1,500 mg a day.  Limit alcohol intake to no more than 1 drink a day for nonpregnant women and 2 drinks a day for men. One drink equals 12 oz of beer, 5 oz of wine, or 1 oz of hard liquor.  Work with your health care provider to maintain a healthy body weight or to lose weight. Ask what  an ideal weight is for you.  Get at least 30 minutes of exercise that causes your heart to beat faster (aerobic exercise) most days of the week. Activities may include walking, swimming, or biking.  Work with your health care provider or diet and nutrition specialist (dietitian) to adjust your eating plan to your individual calorie needs. Reading food  labels   Check food labels for the amount of sodium per serving. Choose foods with less than 5 percent of the Daily Value of sodium. Generally, foods with less than 300 mg of sodium per serving fit into this eating plan.  To find whole grains, look for the word "whole" as the first word in the ingredient list. Shopping   Buy products labeled as "low-sodium" or "no salt added."  Buy fresh foods. Avoid canned foods and premade or frozen meals. Cooking   Avoid adding salt when cooking. Use salt-free seasonings or herbs instead of table salt or sea salt. Check with your health care provider or pharmacist before using salt substitutes.  Do not fry foods. Cook foods using healthy methods such as baking, boiling, grilling, and broiling instead.  Cook with heart-healthy oils, such as olive, canola, soybean, or sunflower oil. Meal planning    Eat a balanced diet that includes:  5 or more servings of fruits and vegetables each day. At each meal, try to fill half of your plate with fruits and vegetables.  Up to 6-8 servings of whole grains each day.  Less than 6 oz of lean meat, poultry, or fish each day. A 3-oz serving of meat is about the same size as a deck of cards. One egg equals 1 oz.  2 servings of low-fat dairy each day.  A serving of nuts, seeds, or beans 5 times each week.  Heart-healthy fats. Healthy fats called Omega-3 fatty acids are found in foods such as flaxseeds and coldwater fish, like sardines, salmon, and mackerel.  Limit how much you eat of the following:  Canned or prepackaged foods.  Food that is high in trans fat, such as fried foods.  Food that is high in saturated fat, such as fatty meat.  Sweets, desserts, sugary drinks, and other foods with added sugar.  Full-fat dairy products.  Do not salt foods before eating.  Try to eat at least 2 vegetarian meals each week.  Eat more home-cooked food and less restaurant, buffet, and fast food.  When eating at  a restaurant, ask that your food be prepared with less salt or no salt, if possible. What foods are recommended? The items listed may not be a complete list. Talk with your dietitian about what dietary choices are best for you. Grains  Whole-grain or whole-wheat bread. Whole-grain or whole-wheat pasta. Brown rice. [place]. Bulgur. Whole-grain and low-sodium cereals. Pita bread. Low-fat, low-sodium crackers. Whole-wheat flour tortillas. Vegetables  Fresh or frozen vegetables (raw, steamed, roasted, or grilled). Low-sodium or reduced-sodium tomato and vegetable juice. Low-sodium or reduced-sodium tomato sauce and tomato paste. Low-sodium or reduced-sodium canned vegetables. Fruits  All fresh, dried, or frozen fruit. Canned fruit in natural juice (without added sugar). Meat and other protein foods  Skinless chicken or [place]. Ground chicken or [place]. Pork with fat trimmed off. Fish and seafood. Egg whites. Dried beans, peas, or lentils. Unsalted nuts, nut butters, and seeds. Unsalted canned beans. Lean cuts of beef with fat trimmed off. Low-sodium, lean deli meat. Dairy  Low-fat (1%) or fat-free (skim) milk. Fat-free, low-fat, or reduced-fat cheeses. Nonfat, low-sodium ricotta  or cottage cheese. Low-fat or nonfat yogurt. Low-fat, low-sodium cheese. Fats and oils  Soft margarine without trans fats. Vegetable oil. Low-fat, reduced-fat, or light mayonnaise and salad dressings (reduced-sodium). Canola, safflower, olive, soybean, and sunflower oils. Avocado. Seasoning and other foods  Herbs. Spices. Seasoning mixes without salt. Unsalted popcorn and pretzels. Fat-free sweets. What foods are not recommended? The items listed may not be a complete list. Talk with your dietitian about what dietary choices are best for you. Grains  Baked goods made with fat, such as croissants, muffins, or some breads. Dry pasta or rice meal packs. Vegetables  Creamed or fried vegetables. Vegetables in a cheese  sauce. Regular canned vegetables (not low-sodium or reduced-sodium). Regular canned tomato sauce and paste (not low-sodium or reduced-sodium). Regular tomato and vegetable juice (not low-sodium or reduced-sodium). Angie Fava. Olives. Fruits  Canned fruit in a light or heavy syrup. Fried fruit. Fruit in cream or butter sauce. Meat and other protein foods  Fatty cuts of meat. Ribs. Fried meat. Berniece Salines. Sausage. Bologna and other processed lunch meats. Salami. Fatback. Hotdogs. Bratwurst. Salted nuts and seeds. Canned beans with added salt. Canned or smoked fish. Whole eggs or egg yolks. Chicken or [place] with skin. Dairy  Whole or 2% milk, cream, and half-and-half. Whole or full-fat cream cheese. Whole-fat or sweetened yogurt. Full-fat cheese. Nondairy creamers. Whipped toppings. Processed cheese and cheese spreads. Fats and oils  Butter. Stick margarine. Lard. Shortening. Ghee. Bacon fat. Tropical oils, such as coconut, palm kernel, or palm oil. Seasoning and other foods  Salted popcorn and pretzels. Onion salt, garlic salt, seasoned salt, table salt, and sea salt. Worcestershire sauce. Tartar sauce. Barbecue sauce. Teriyaki sauce. Soy sauce, including reduced-sodium. Steak sauce. Canned and packaged gravies. Fish sauce. Oyster sauce. Cocktail sauce. Horseradish that you find on the shelf. Ketchup. Mustard. Meat flavorings and tenderizers. Bouillon cubes. Hot sauce and Tabasco sauce. Premade or packaged marinades. Premade or packaged taco seasonings. Relishes. Regular salad dressings. Where to find more information:  National Heart, Lung, and [place]: https://wilson-eaton.com/  American Heart Association: www.heart.org Summary  The DASH eating plan is a healthy eating plan that has been shown to reduce high blood pressure (hypertension). It may also reduce your risk for type 2 diabetes, heart disease, and stroke.  With the DASH eating plan, you should limit salt (sodium) intake to 2,300 mg a day. If  you have hypertension, you may need to reduce your sodium intake to 1,500 mg a day.  When on the DASH eating plan, aim to eat more fresh fruits and vegetables, whole grains, lean proteins, low-fat dairy, and heart-healthy fats.  Work with your health care provider or diet and nutrition specialist (dietitian) to adjust your eating plan to your individual calorie needs. This information is not intended to replace advice given to you by your health care provider. Make sure you discuss any questions you have with your health care provider. Document Released: 05/20/2011 Document Revised: 05/24/2016 Document Reviewed: 05/24/2016 Elsevier Interactive Patient Education  2017 [employer].  Exercising to [employer] Exercising can help you to lose weight. In order to lose weight through exercise, you need to do vigorous-intensity exercise. You can tell that you are exercising with vigorous intensity if you are breathing very hard and fast and cannot hold a conversation while exercising. Moderate-intensity exercise helps to maintain your current weight. You can tell that you are exercising at a moderate level if you have a higher heart rate and faster breathing, but you are still able  to hold a conversation. How often should I exercise? Choose an activity that you enjoy and set realistic goals. Your health care provider can help you to make an activity plan that works for you. Exercise regularly as directed by your health care provider. This may include:  Doing resistance training twice each week, such as:  Push-ups.  Sit-ups.  Lifting weights.  Using resistance bands.  Doing a given intensity of exercise for a given amount of time. Choose from these options:  150 minutes of moderate-intensity exercise every week.  75 minutes of vigorous-intensity exercise every week.  A mix of moderate-intensity and vigorous-intensity exercise every week. Children, pregnant women, people who are out of shape,  people who are overweight, and older adults may need to consult a health care provider for individual recommendations. If you have any sort of medical condition, be sure to consult your health care provider before starting a new exercise program. What are some activities that can help me to lose weight?  Walking at a rate of at least 4.5 miles an hour.  Jogging or running at a rate of 5 miles per hour.  Biking at a rate of at least 10 miles per hour.  Lap swimming.  Roller-skating or in-line skating.  Cross-country skiing.  Vigorous competitive sports, such as football, basketball, and soccer.  Jumping rope.  Aerobic dancing. How can I be more active in my day-to-day activities?  Use the stairs instead of the elevator.  Take a walk during your lunch break.  If you drive, park your car farther away from work or school.  If you take public transportation, get off one stop early and walk the rest of the way.  Make all of your phone calls while standing up and walking around.  Get up, stretch, and walk around every 30 minutes throughout the day. What guidelines should I follow while exercising?  Do not exercise so much that you hurt yourself, feel dizzy, or get very short of breath.  Consult your health care provider prior to starting a new exercise program.  Wear comfortable clothes and shoes with good support.  Drink plenty of water while you exercise to prevent dehydration or heat stroke. Body water is lost during exercise and must be replaced.  Work out until you breathe faster and your heart beats faster. This information is not intended to replace advice given to you by your health care provider. Make sure you discuss any questions you have with your health care provider. Document Released: 07/03/2010 Document Revised: 11/06/2015 Document Reviewed: 11/01/2013 Elsevier Interactive Patient Education  2017 [employer].  IF you received an x-ray today, you will receive  an invoice from [facility]. Please contact [facility] at [phone] with questions or concerns regarding your invoice.   IF you received labwork today, you will receive an invoice from [facility]. Please contact [facility] at [phone] with questions or concerns regarding your invoice.   Our billing staff will not be able to assist you with questions regarding bills from these companies.  You will be contacted with the lab results as soon as they are available. The fastest way to get your results is to activate your My Chart account. Instructions are located on the last page of this paperwork. If you have not heard from [place] regarding the results in 2 weeks, please contact this office.

## 2016-09-27 NOTE — Telephone Encounter (Signed)
Pt seen today. Following up on prescription he said was to be called in for 100 MG of atenolol. Please advise. Pt callback number is [phone].

## 2016-09-27 NOTE — Progress Notes (Signed)
Jeffrey Parks  MRN: [id number] DOB: 20-Feb-1951  PCP: Gelene Mink Macon Lesesne, PA-C  Subjective:  Pt is a pleasant 66 year old male who presents to clinic for multiple complaints.   dizziness and fatigue has been an ongoing problem for this pt. He was evaluated by cardiology last month. Echo was performed. No explanation for his dizzy/fatigue symptoms was discovered.  24 hour Holter monitor 09/2016: No significant arrhythmias to explain his dizziness.  Has been advised many times by several providers to f/u with neurology for OSA. He refuses as he says "I sleep fine".   At his last OV he was advised to d/c Norvasc. Today, his blood pressure is 164/90 - repeat is 151/98. He reports a decrease in weakness and fatigue "not as pronounced as before".  C/o LES swelling. He is not wearing his compression socks. Not elevating his feet.  He is taking atenolol 50mg , Hyzaar 100-25mg , Lasix 40 mg.  Decreased hearing - this is a new complaint. this has been a problem for him for several months. Both ears. Denies pain, drainage, pressure, headaches. He wonders if there is a connection to his dizziness.   Review of Systems  Constitutional: Positive for fatigue.  Respiratory: Negative for cough, chest tightness, shortness of breath and wheezing.   Cardiovascular: Positive for leg swelling. Negative for chest pain and palpitations.  Gastrointestinal: Negative for abdominal pain, blood in stool, constipation, diarrhea, nausea and vomiting.  Genitourinary: Negative for decreased urine volume, difficulty urinating, discharge, hematuria, scrotal swelling and testicular pain.  Neurological: Positive for dizziness. Negative for syncope, weakness, light-headedness and headaches.    Patient Active Problem List   Diagnosis Date Noted  . Leg swelling 09/08/2016  . Sleep apnea 09/08/2016  . Dizziness 09/08/2016  . Fatigue 05/30/2015  . Hypogonadism male 05/22/2015  . Screening for prostate cancer  05/22/2015  . Snoring 05/22/2015  . Morbid obesity due to excess calories ([place]) 05/22/2015  . 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 Essential hypertension 05/07/2015  . Hyperglycemia 05/07/2015  . Erectile dysfunction 05/07/2015    Current Outpatient Prescriptions on File Prior to Visit  Medication Sig Dispense Refill  . acetaminophen (TYLENOL ARTHRITIS PAIN) 650 MG CR tablet Take 1 tablet (650 mg total) by mouth every 6 (six) hours as needed for pain. 30 tablet 0  . atenolol (TENORMIN) 50 MG tablet TAKE 1 TABLET BY MOUTH ONCE DAILY 90 tablet 0  . atenolol (TENORMIN) 50 MG tablet Take 1 tablet (50 mg total) by mouth daily. 30 tablet 5  . cetirizine (ZYRTEC) 10 MG tablet Take 1 tablet (10 mg total) by mouth daily. 30 tablet 11  . diclofenac sodium (VOLTAREN) 1 % GEL Apply 4 g topically 4 (four) times daily. 100 g 1  . fluticasone (FLONASE) 50 MCG/ACT nasal spray Place 2 sprays into both nostrils daily. 16 g 6  . furosemide (LASIX) 40 MG tablet Take 2 tablets (80 mg total) by mouth daily. 30 tablet 3  . meloxicam (MOBIC) 15 MG tablet Take 1 tablet (15 mg total) by mouth daily. 30 tablet 1  . naproxen (NAPROSYN) 500 MG tablet Take 1 tablet (500 mg total) by mouth 2 (two) times daily with a meal. 20 tablet 0  . polyethylene glycol powder (GLYCOLAX/MIRALAX) powder Take 17 g by mouth 2 (two) times daily as needed. 578 g 1   No current facility-administered medications on file prior to visit.     Allergies  Allergen Reactions  . Norvasc [Amlodipine]      Objective:  BP (!) 164/90   Pulse 60   Temp 98 F (36.7 C) (Oral)   Resp 16   Ht 5\' 7"  (1.702 m)   Wt 258 lb (117 kg)   SpO2 96%   BMI 40.41 kg/m   Physical Exam  Constitutional: He is oriented to person, place, and time.  HENT:  Right Ear: Tympanic membrane normal. Tympanic membrane is not erythematous, not retracted and not bulging. No middle ear effusion.  Left Ear: Tympanic membrane normal. Tympanic membrane is not  erythematous, not retracted and not bulging.  No middle ear effusion.  Cardiovascular: Normal rate, regular rhythm, intact distal pulses and normal pulses.   Musculoskeletal:       Right lower leg: He exhibits edema.       Left lower leg: He exhibits edema.  Neurological: He is alert and oriented to person, place, and time.  Skin: Skin is warm and dry.   Orthostatic vital signs: BP laying: 150/94 BP sitting: 158/98 BP standing: 164/111  Echo 09/2016: Study Conclusions - Left ventricle: The cavity size was normal. There was moderate   concentric hypertrophy. Systolic function was normal. The   estimated ejection fraction was in the range of 60% to 65%. Wall   motion was normal; there were no regional wall motion   abnormalities. The study is not technically sufficient to allow   evaluation of LV diastolic function. - Aortic valve: Trileaflet; mildly thickened, mildly calcified   leaflets. - Right ventricle: The cavity size was normal. Wall thickness was   normal. Systolic function was normal.  24 hour Holter monitor 09/2016: No significant arrhythmias to explain his dizziness.  Assessment and Plan :  1. Essential hypertension 2. Elevated blood pressure reading - atenolol (TENORMIN) 100 MG tablet; Take 1 tablet (100 mg total) by mouth daily.  Dispense: 90 tablet; Refill: 3 - Recheck vitals - Today, his blood pressure is 164/90 - repeat is 151/98. Will increase atenolol to 100mg  qd. RTC in 3-4 weeks for recheck. Asked pt to keep track of blood pressures at home.  - DASH diet discussed and printed out for pt. Encouraged increased exercise and better diet.   3. Edema of both legs 4. Dizziness - Orthostatic vital signs - Pt has recently been evaluated by cardiology - no explanation for dizziness was discovered. Pt has not been wearing compression socks. He has a pair, he does not wear them. Encouraged pt to start wearing them. He has not f/u with neurology for sleep study. Referral has  been made, encouraged pt to call and sched appt. Advised pt to start exercising regularly, stay well hydrated. RTC in 3-4 weeks for recheck. If no improvement, consider referral to neurology or ENT.   5. Decreased hearing of both ears - Ambulatory referral to Audiology   Mercer Pod, PA-C  [facility] 09/27/2016 11:48 AM

## 2016-09-28 NOTE — Telephone Encounter (Signed)
Done. Please let him know. TY!

## 2016-09-30 NOTE — Telephone Encounter (Signed)
Pt has already had it filled.

## 2016-10-08 ENCOUNTER — Ambulatory Visit (INDEPENDENT_AMBULATORY_CARE_PROVIDER_SITE_OTHER): Payer: BC Managed Care – PPO | Admitting: Neurology

## 2016-10-08 DIAGNOSIS — G4733 Obstructive sleep apnea (adult) (pediatric): Secondary | ICD-10-CM

## 2016-10-08 DIAGNOSIS — G4761 Periodic limb movement disorder: Secondary | ICD-10-CM

## 2016-10-18 ENCOUNTER — Ambulatory Visit: Payer: BC Managed Care – PPO | Admitting: Physician Assistant

## 2016-10-20 ENCOUNTER — Telehealth: Payer: Self-pay | Admitting: Neurology

## 2016-10-20 NOTE — Telephone Encounter (Signed)
Patient calling in reference to sleep study results done 10/08/16.  Please call

## 2016-10-21 ENCOUNTER — Telehealth: Payer: Self-pay

## 2016-10-21 NOTE — Addendum Note (Signed)
Addended by: Larey Seat on: 10/21/2016 12:39 PM   Modules accepted: Orders

## 2016-10-21 NOTE — Telephone Encounter (Signed)
I called pt. I advised pt that Dr. Brett Fairy reviewed their sleep study results and found that pt did well with the cpap and his osa was controlled while using cpap. Dr. Brett Fairy recommends that pt start cpap at home. I also recommended to the pt that he needs to consider weight reduction, avoidance of medications with muscle relaxant properties alcohol, and sleeping supine. I advised pt that he should avoid driving when sleepy. I advised him that PLMs were noted in his study but that Dr. Brett Fairy will investigate these further at his next visit I reviewed PAP compliance expectations with the pt. Pt is agreeable to starting a CPAP. I advised pt that an order will be sent to a DME, [facility], and [facility] will call the pt within about one week after they file with the pt's insurance. [facility] will show the pt how to use the machine, fit for masks, and troubleshoot the CPAP if needed. A follow up appt was made for insurance purposes with Dr. Brett Fairy on Wednesday, September 5th, 2018 at 1:30pm. Pt verbalized understanding to arrive 15 minutes early and bring their CPAP. A letter with all of this information in it will be mailed to the pt as a reminder. I verified with the pt that the address we have on file is correct. Pt verbalized understanding of results. Pt had no questions at this time but was encouraged to call back if questions arise.

## 2016-10-21 NOTE — Telephone Encounter (Signed)
This encounter was created in error - please disregard.

## 2016-10-21 NOTE — Telephone Encounter (Signed)
See other phone note from today.

## 2016-10-21 NOTE — Telephone Encounter (Signed)
-----   Message from Larey Seat, MD sent at 10/21/2016 12:39 PM EDT ----- OSA controlled under CPAP at 10 cm water pressure with a nasal interface, an ESON NASAL mask in medium size. Order CPAP at 11 cm water, with 2 cm EPR and heated humidity; use the interface as mentioned above.  RV with MD in 40-90 days.   1. Weight reduction should be aggressively pursued. BMI indicates morbid obesity at 37.1. a. Avoidance of medications with muscle relaxant properties. b. Avoidance of ingestion of alcohol prior to sleeping. c. Avoiding sleeping in the supine position (on one's back). d. Improvement of nasal patency if indicated. e. Avoidance of driving if or when sleepy. 2. PLMS:  Will be further investigated at next visit.

## 2016-10-21 NOTE — Procedures (Signed)
PATIENT'S NAME:  Jasn, Xia DOB:      01-10-1951      MR#:    [id number]     DATE OF RECORDING: 10/08/2016 REFERRING M.D.:  Arlyss Queen, MD/ [facility] Study Performed:   CPAP Titration Study HISTORY:  Mr. Otterness presented with increasing fatigue for the last 4 or 5 months, reported that his wife had confirmed that he snores.  A diagnostic polysomnography study from 08/01/2015 revealed an overall AHI = 54.4/ hr. and REM AHI of 58.8/hr. with an SAO2 Nadir of 82%. Sleep efficiency was 71.7%, WASO = 55.0 minutes. Frequent PLM arousals were noted.  He returned for CPAP titration.   The patient endorsed the Epworth Sleepiness Scale at 2 points and the Fatigue Score at 18 points.   The patient's weight 244 pounds with a height of 68 (inches), resulting in a BMI of 37.1 kg/m2. The patient's neck circumference measured 19.5 inches.  CURRENT MEDICATIONS: Norvasc, Hygroton, Cozaar, Viagra  PROCEDURE:  This is a multichannel digital polysomnogram utilizing the SomnoStar 11.2 system.  Electrodes and sensors were applied and monitored per AASM Specifications.   EEG, EOG, Chin and Limb EMG, were sampled at 200 Hz.  ECG, Snore and Nasal Pressure, Thermal Airflow, Respiratory Effort, CPAP Flow and Pressure, Oximetry was sampled at 50 Hz. Digital video and audio were recorded.      CPAP was initiated at 5 cmH20 with heated humidity per AASM split night standards and pressure was advanced to 10cmH20 because of hypopneas, apneas and desaturations.  At a PAP pressure of 10 cmH20, there was a reduction of the AHI to 2.0 with improvement of obstructive sleep apnea.    Lights Out was at 22:25 and Lights On at 05:00. Total recording time (TRT) was 395.5 minutes, with a total sleep time (TST) of 272 minutes. The patient's sleep latency was 8 minutes with 70.5 minutes of wake time after sleep onset. REM latency was 148 minutes.  The sleep efficiency was still poor at 68.8 %.    SLEEP ARCHITECTURE: WASO (Wake  after sleep onset) was 45 minutes.  There were 18 minutes in Stage N1, 195 minutes Stage N2, 18 minutes Stage N3 and 41 minutes in Stage REM.  The percentage of Stage N1 was 6.6%, Stage N2 was 71.7%, Stage N3 was 6.6% and Stage R (REM sleep) was 15.1%. [] The sleep architecture was notable for sustained REM sleep.  RESPIRATORY ANALYSIS:  There was a total of 45 respiratory events: 4 obstructive apneas, 0 central apneas and 2 mixed apneas with 39 hypopneas. The patient also had 0 respiratory event related arousals (RERAs).    The total APNEA/HYPOPNEA INDEX  (AHI) was 9.9 /hour and the total RESPIRATORY DISTURBANCE INDEX was 9.9 .hour  0 events occurred in REM sleep and 45 events in NREM. The REM AHI was 0 /hour versus a non-REM AHI of 11.7 /hour. The patient spent 74 minutes of total sleep time in the supine position and 198 minutes in non-supine. The supine AHI was 8.1, versus a non-supine AHI of 10.6.  OXYGEN SATURATION & C02:  The baseline 02 saturation was 96%, with the lowest being 91%.   PERIODIC LIMB MOVEMENTS:   The patient had a total of 242 Periodic Limb Movements. The Periodic Limb Movement (PLM) index was 53.4 and the PLM Arousal index was 22.5 /hour. The arousals were noted as: 39 were spontaneous, 102 were associated with PLMs, and only 11 were associated with respiratory events.  Audio and video analysis did  not show any complex movements, behaviors, phonations or vocalizations.   The patient took two bathroom breaks. Snoring was noted. PLMs were severely fragmenting the non REM sleep.  EKG was in keeping with normal sinus rhythm (NSR) with some PVCs. Marland Kitchen Post-study, the patient indicated that sleep was improved ,compared to his usual  experience.   DIAGNOSIS 1. Obstructive Sleep Apnea  2. Periodic Limb Movement Disorder  3. Reduced sleep efficiency.   PLANS/RECOMMENDATIONS:  OSA controlled under CPAP at 10 cm water pressure with a nasal interface, an ESON NASAL mask in medium size.  Order CPAP at 11 cm water, with 2 cm EPR and heated humidity; use the interface as mentioned above.  RV with MD in 40-90 days.   1. Weight reduction should be aggressively pursued. BMI indicates morbid obesity at 37.1. a. Avoidance of medications with muscle relaxant properties. b. Avoidance of ingestion of alcohol prior to sleeping. c. Avoiding sleeping in the supine position (on one's back). d. Improvement of nasal patency if indicated. e. Avoidance of driving if or when sleepy. 2. PLMS:  Exclude secondary factors for periodic limb movements of sleep.  Check ferritin, renal function, electrolytes, magnesium, calcium, B12, folate, Vit D (treat deficiencies if present; serum ferritin values less than 50 micro g/L should be treated with iron replacement, along with appropriate investigation for causes of iron deficiency through PCP, GI ).  Exclude any history or physical examination data supporting peripheral neuropathy.    A follow up appointment will be scheduled in the Sleep Clinic at [facility].   Please call [phone] with any questions.     I certify that I have reviewed the entire raw data recording prior to the issuance of this report in accordance with the Standards of Accreditation of the American Academy of Sleep Medicine (AASM)    Larey Seat, M.D.   10-21-2016  Diplomat, American Board of Psychiatry and Neurology  Diplomat, [place] of Sleep Medicine Medical Director of [place] [facility] at [facility]

## 2016-10-28 ENCOUNTER — Ambulatory Visit: Payer: BC Managed Care – PPO | Admitting: Neurology

## 2016-11-18 ENCOUNTER — Telehealth: Payer: Self-pay | Admitting: Physician Assistant

## 2016-11-18 NOTE — Telephone Encounter (Signed)
patient would like to get his HCTZ sent into [facility] on [address] as a 90 supply, I could not find this medication on his current list so I asked Brionna for help and she stated it looks like we changed him to Atenolol and Lasix. So we may need to call to patient and find out what he is needing to be refilled.  His call back number is [phone]

## 2016-11-19 NOTE — Telephone Encounter (Signed)
L/m per tasha and dr Darliss Ridgel research pts losartan was d/c 09/27/16 and atenolol 150mg  started.  Pt needs to come back and see [place] anyway (f/u 1 month)  Please call for an appt.

## 2016-11-19 NOTE — Telephone Encounter (Signed)
Pt did call back and stated he is taking losartan hctz, but it isn't on his med list and he wants a refill of this. Pls Advise and fill accordingly. Pt states he is completely out.

## 2016-11-19 NOTE — Telephone Encounter (Signed)
lmtcb and clarify

## 2016-12-02 ENCOUNTER — Ambulatory Visit (INDEPENDENT_AMBULATORY_CARE_PROVIDER_SITE_OTHER): Payer: BC Managed Care – PPO | Admitting: Physician Assistant

## 2016-12-02 VITALS — BP 150/102 | HR 61 | Temp 98.8°F | Resp 16 | Ht 67.0 in | Wt 259.0 lb

## 2016-12-02 DIAGNOSIS — G4730 Sleep apnea, unspecified: Secondary | ICD-10-CM | POA: Diagnosis not present

## 2016-12-02 DIAGNOSIS — R030 Elevated blood-pressure reading, without diagnosis of hypertension: Secondary | ICD-10-CM

## 2016-12-02 DIAGNOSIS — I10 Essential (primary) hypertension: Secondary | ICD-10-CM

## 2016-12-02 DIAGNOSIS — Z79899 Other long term (current) drug therapy: Secondary | ICD-10-CM

## 2016-12-02 DIAGNOSIS — K5900 Constipation, unspecified: Secondary | ICD-10-CM | POA: Diagnosis not present

## 2016-12-02 MED ORDER — ATENOLOL 100 MG PO TABS: 100.0000 mg | ORAL_TABLET | Freq: Every day | ORAL | 3 refills | Status: DC

## 2016-12-02 MED ORDER — LOSARTAN POTASSIUM-HCTZ 100-25 MG PO TABS: 1.0000 | ORAL_TABLET | Freq: Every day | ORAL | 3 refills | Status: DC

## 2016-12-02 MED ORDER — BISACODYL 10 MG RE SUPP
10.0000 mg | RECTAL | 0 refills | Status: DC | PRN
Start: 2016-12-02 — End: 2017-01-14

## 2016-12-02 MED ORDER — BISACODYL 5 MG PO TBEC: 5.0000 mg | DELAYED_RELEASE_TABLET | Freq: Every day | ORAL | 0 refills | Status: DC | PRN

## 2016-12-02 NOTE — Telephone Encounter (Signed)
Patient called office in reference to not receiving his CPAP machine yet.  Please call

## 2016-12-02 NOTE — Patient Instructions (Addendum)
Please call Dr. Brett Fairy and ask why you have not received your CPAP machine. This will greatly help bring down your blood pressure.  Keep you follow-up appointment with Dr. Brett Fairy on Wednesday, September 5th, 2018 at 1:30pm Please come back and see me in 3 months.   Dulcolax + Dulcolax suppositories. take 2 Dulcolax tablets now and repeat tonight. If no bowel movement he will take 2 Dulcolax in the morning and 2 tomorrow night. He is to use a Dulcolax suppository now repeat tonight and repeat in the morning and evening tomorrow if he does not get results.   Constipation, Adult Constipation is when a person has fewer bowel movements in a week than normal, has difficulty having a bowel movement, or has stools that are dry, hard, or larger than normal. Constipation may be caused by an underlying condition. It may become worse with age if a person takes certain medicines and does not take in enough fluids. Follow these instructions at home: Eating and drinking   Eat foods that have a lot of fiber, such as fresh fruits and vegetables, whole grains, and beans.  Limit foods that are high in fat, low in fiber, or overly processed, such as french fries, hamburgers, cookies, candies, and soda.  Drink enough fluid to keep your urine clear or pale yellow. General instructions  Exercise regularly or as told by your health care provider.  Go to the restroom when you have the urge to go. Do not hold it in.  Take over-the-counter and prescription medicines only as told by your health care provider. These include any fiber supplements.  Practice pelvic floor retraining exercises, such as deep breathing while relaxing the lower abdomen and pelvic floor relaxation during bowel movements.  Watch your condition for any changes.  Keep all follow-up visits as told by your health care provider. This is important. Contact a health care provider if:  You have pain that gets worse.  You have a  fever.  You do not have a bowel movement after 4 days.  You vomit.  You are not hungry.  You lose weight.  You are bleeding from the anus.  You have thin, pencil-like stools. Get help right away if:  You have a fever and your symptoms suddenly get worse.  You leak stool or have blood in your stool.  Your abdomen is bloated.  You have severe pain in your abdomen.  You feel dizzy or you faint. This information is not intended to replace advice given to you by your health care provider. Make sure you discuss any questions you have with your health care provider. Document Released: 02/27/2004 Document Revised: 12/19/2015 Document Reviewed: 11/19/2015 Elsevier Interactive Patient Education  2017 [employer].   IF you received an x-ray today, you will receive an invoice from [facility]. Please contact [facility] at [phone] with questions or concerns regarding your invoice.   IF you received labwork today, you will receive an invoice from [facility]. Please contact [facility] at [phone] with questions or concerns regarding your invoice.   Our billing staff will not be able to assist you with questions regarding bills from these companies.  You will be contacted with the lab results as soon as they are available. The fastest way to get your results is to activate your My Chart account. Instructions are located on the last page of this paperwork. If you have not heard from [place] regarding the results in 2 weeks, please contact this office.

## 2016-12-02 NOTE — Progress Notes (Signed)
Jeffrey Parks  MRN: [id number] DOB: Mar 14, 1951  PCP: Dorise Hiss, PA-C  Subjective:  Pt is a pleasant 66 year old male PMH HTN, OSA, who presents to clinic for medication management.   HTN - uncontrolled with atenolol 100mg , Hyzaar 100-25 mg. Today's blood pressure is 178/102, recheck is 150/102. Needs refills - would like 90-day supply.  He took his blood pressure medicine this morning.   He is walking about 0.5 miles 3x/week.  Does not check home blood pressures.  Jeffrey Parks had a sleep study done last month. He has not yet received his CPAP machine. He is unclear when he was supposed to receive it.  F/u appt with Dr. Brett Fairy on Sept 5.   C/o constipation x a few days. Last bowel movement was yesterday. He is not drinking much water. He has tried Miralax -not helping.   Review of Systems  Respiratory: Negative for cough, chest tightness, shortness of breath and wheezing.   Cardiovascular: Negative for chest pain and palpitations.  Gastrointestinal: Positive for constipation. Negative for abdominal pain, diarrhea, nausea and vomiting.  Neurological: Negative for dizziness and headaches.    Patient Active Problem List   Diagnosis Date Noted  . Leg swelling 09/08/2016  . Sleep apnea 09/08/2016  . Dizziness 09/08/2016  . Fatigue 05/30/2015  . Hypogonadism male 05/22/2015  . Screening for prostate cancer 05/22/2015  . Snoring 05/22/2015  . Morbid obesity due to excess calories ([place]) 05/22/2015  . 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 Essential hypertension 05/07/2015  . Hyperglycemia 05/07/2015  . Erectile dysfunction 05/07/2015    Current Outpatient Prescriptions on File Prior to Visit  Medication Sig Dispense Refill  . acetaminophen (TYLENOL ARTHRITIS PAIN) 650 MG CR tablet Take 1 tablet (650 mg total) by mouth every 6 (six) hours as needed for pain. 30 tablet 0  . atenolol (TENORMIN) 100 MG tablet Take 1 tablet (100 mg total) by mouth daily. 90  tablet 3  . cetirizine (ZYRTEC) 10 MG tablet Take 1 tablet (10 mg total) by mouth daily. 30 tablet 11  . diclofenac sodium (VOLTAREN) 1 % GEL Apply 4 g topically 4 (four) times daily. 100 g 1  . fluticasone (FLONASE) 50 MCG/ACT nasal spray Place 2 sprays into both nostrils daily. 16 g 6  . furosemide (LASIX) 40 MG tablet Take 2 tablets (80 mg total) by mouth daily. 30 tablet 3  . meloxicam (MOBIC) 15 MG tablet Take 1 tablet (15 mg total) by mouth daily. 30 tablet 1  . naproxen (NAPROSYN) 500 MG tablet Take 1 tablet (500 mg total) by mouth 2 (two) times daily with a meal. 20 tablet 0  . polyethylene glycol powder (GLYCOLAX/MIRALAX) powder Take 17 g by mouth 2 (two) times daily as needed. 578 g 1  . atenolol (TENORMIN) 50 MG tablet Take 1 tablet (50 mg total) by mouth daily. (Patient not taking: Reported on 12/02/2016) 30 tablet 5   No current facility-administered medications on file prior to visit.     Allergies  Allergen Reactions  . Norvasc [Amlodipine]      Objective:  BP (!) 178/102   Pulse 61   Temp 98.8 F (37.1 C) (Oral)   Resp 16   Ht 5\' 7"  (1.702 m)   Wt 259 lb (117.5 kg)   SpO2 98%   BMI 40.57 kg/m   Physical Exam  Constitutional: He is oriented to person, place, and time and well-developed, well-nourished, and in no distress. No distress.  obese  Cardiovascular: Normal  rate, regular rhythm and normal heart sounds.   Neurological: He is alert and oriented to person, place, and time. GCS score is 15.  Skin: Skin is warm and dry.  Psychiatric: Mood, memory, affect and judgment normal.  Vitals reviewed.  Lab Results  Component Value Date   CREATININE 1.10 08/13/2016   BUN 13 08/13/2016   NA 141 08/13/2016   K 3.8 08/13/2016   CL 98 08/13/2016   CO2 26 08/13/2016    Assessment and Plan :  1. Essential hypertension - losartan-hydrochlorothiazide (HYZAAR) 100-25 MG tablet; Take 1 tablet by mouth daily.  Dispense: 90 tablet; Refill: 3 - atenolol (TENORMIN) 100 MG  tablet; Take 1 tablet (100 mg total) by mouth daily.  Dispense: 90 tablet; Refill: 3 - Recheck vitals 2. Elevated blood pressure reading 3. Sleep apnea, unspecified type 4. Morbid obesity due to excess calories ([place]) 5. Encounter for medication management - Jeffrey Parks has had a difficult time getting his blood pressure under control x the better part of a year. Lab work 08/2016 negative for kidney problems. D/c'd Norvasc at his last OV due to b/l edema - this has improved since that time. He was evaluated by cardiology 08/2016 with negative echo and 24-hour holter monitor. He finally had a sleep study last month and recommended to use CPAP. He has not yet received this as of yet. Advised pt to f/u with Dr. Edwena Felty office and inquire about this. Encouraged him to keep f/u appt with Dr. Brett Fairy in Sept. Suspect OSA contributing to Mr. Riggenbach's uncontrolled HTN. He understands and agrees. F/u with me in 3 months.   6. Constipation, unspecified constipation type - bisacodyl (DULCOLAX) 10 MG suppository; Place 1 suppository (10 mg total) rectally as needed for moderate constipation.  Dispense: 12 suppository; Refill: 0 - bisacodyl (DULCOLAX) 5 MG EC tablet; Take 1 tablet (5 mg total) by mouth daily as needed for moderate constipation.  Dispense: 30 tablet; Refill: 0 - Constipation information discussed and printed out for pt. RTC in 3-5 days if no improvement.    Mercer Pod, PA-C  [facility] at [place] 12/02/2016 10:40 AM

## 2016-12-02 NOTE — Telephone Encounter (Signed)
I have reached to [facility] to find out what is going on with pt's cpap.

## 2016-12-06 NOTE — Telephone Encounter (Signed)
Advised patient per previous message to call [facility].

## 2016-12-06 NOTE — Telephone Encounter (Signed)
Received this notice from [facility]:  "I have been trying to contact him to schedule since last month.  I have [phone] as his phone number. Please let me know if you have a different number for him.  "  I called pt to discuss and ask him to call [facility] on his own, number is [phone].  No answer, left a message asking him to call me back.

## 2016-12-21 ENCOUNTER — Encounter: Payer: Self-pay | Admitting: Neurology

## 2016-12-24 ENCOUNTER — Ambulatory Visit: Payer: BC Managed Care – PPO | Admitting: Neurology

## 2016-12-28 ENCOUNTER — Other Ambulatory Visit: Payer: Self-pay | Admitting: Physician Assistant

## 2016-12-28 DIAGNOSIS — K5900 Constipation, unspecified: Secondary | ICD-10-CM

## 2017-01-14 ENCOUNTER — Ambulatory Visit (INDEPENDENT_AMBULATORY_CARE_PROVIDER_SITE_OTHER): Payer: BC Managed Care – PPO | Admitting: Neurology

## 2017-01-14 ENCOUNTER — Encounter: Payer: Self-pay | Admitting: Neurology

## 2017-01-14 VITALS — BP 152/100 | HR 55 | Ht 67.0 in | Wt 258.0 lb

## 2017-01-14 DIAGNOSIS — R292 Abnormal reflex: Secondary | ICD-10-CM | POA: Diagnosis not present

## 2017-01-14 DIAGNOSIS — R2681 Unsteadiness on feet: Secondary | ICD-10-CM | POA: Insufficient documentation

## 2017-01-14 DIAGNOSIS — M542 Cervicalgia: Secondary | ICD-10-CM | POA: Insufficient documentation

## 2017-01-14 NOTE — Progress Notes (Signed)
NEUROLOGY CONSULTATION NOTE  Jeffrey Parks MRN: [id number] DOB: [birth year]  Referring provider: Juanda Crumble, PA-C Primary care provider: Juanda Crumble, PA-C   Reason for consult:  dizziness  Thank you for your kind referral of Jeffrey Parks for consultation of the above symptoms. Although his history is well known to you, please allow me to reiterate it for the purpose of our medical record. Records and images were personally reviewed where available.  HISTORY OF PRESENT ILLNESS: This is a pleasant 66 year old right-handed man with a history of hypertension, presenting for evaluation of gait instability. He started to notice changes gradually since March, he feels unsteady when walking, going to one side or the other, no falls. He denies any associated spinning or lightheadedness. He denies any focal numbness/tingling/weakness, he has occasional neck and back pain. He denies any recent falls or head trauma. No headaches, diplopia, dysarthria/dysphagia, hearing loss, bladder dysfunction. He has occasional right-sided low-pitched tinnitus. He has occasional constipation. No family history of similar symptoms. He drinks alcohol on occasion. He has been having difficulties controlling BP, BP today is 152/100.   Laboratory Data: Lab Results  Component Value Date   TSH 0.938 05/07/2015   Lab Results  Component Value Date   VITAMINB12 >2000 (H) 05/03/2016     PAST MEDICAL HISTORY: Past Medical History:  Diagnosis Date  . Hypertension   . Low testosterone   . Sickle cell trait ([place])     PAST SURGICAL HISTORY: Past Surgical History:  Procedure Laterality Date  . None      MEDICATIONS: Current Outpatient Prescriptions on File Prior to Visit  Medication Sig Dispense Refill  . acetaminophen (TYLENOL ARTHRITIS PAIN) 650 MG CR tablet Take 1 tablet (650 mg total) by mouth every 6 (six) hours as needed for pain. 30 tablet 0  . atenolol (TENORMIN) 100 MG tablet Take 1  tablet (100 mg total) by mouth daily. 90 tablet 3  . bisacodyl (DULCOLAX) 10 MG suppository Place 1 suppository (10 mg total) rectally as needed for moderate constipation. 12 suppository 0  . bisacodyl (DULCOLAX) 5 MG EC tablet Take 1 tablet (5 mg total) by mouth daily as needed for moderate constipation. 30 tablet 0  . cetirizine (ZYRTEC) 10 MG tablet Take 1 tablet (10 mg total) by mouth daily. 30 tablet 11  . diclofenac sodium (VOLTAREN) 1 % GEL Apply 4 g topically 4 (four) times daily. 100 g 1  . fluticasone (FLONASE) 50 MCG/ACT nasal spray Place 2 sprays into both nostrils daily. 16 g 6  . furosemide (LASIX) 40 MG tablet Take 2 tablets (80 mg total) by mouth daily. 30 tablet 3  . losartan-hydrochlorothiazide (HYZAAR) 100-25 MG tablet Take 1 tablet by mouth daily. 90 tablet 3  . meloxicam (MOBIC) 15 MG tablet Take 1 tablet (15 mg total) by mouth daily. 30 tablet 1  . naproxen (NAPROSYN) 500 MG tablet Take 1 tablet (500 mg total) by mouth 2 (two) times daily with a meal. 20 tablet 0  . polyethylene glycol powder (GLYCOLAX/MIRALAX) powder Take 17 g by mouth 2 (two) times daily as needed. 578 g 1   No current facility-administered medications on file prior to visit.     ALLERGIES: Allergies  Allergen Reactions  . Norvasc [Amlodipine]     FAMILY HISTORY: Family History  Problem Relation Age of Onset  . Hypertension Mother   . Diabetes Unknown   . Sickle cell anemia Son   . Sickle cell anemia Son  SOCIAL HISTORY: Social History   Social History  . Marital status: Single    Spouse name: N/A  . Number of children: N/A  . Years of education: N/A   Occupational History  . Not on file.   Social History Main Topics  . Smoking status: Former Smoker    Types: Cigarettes  . Smokeless tobacco: Never Used     Comment: Quit two years ago.  Light smoker prior.   . Alcohol use 1.8 oz/week    3 Standard drinks or equivalent per week  . Drug use: No  . Sexual activity: Not on file     Other Topics Concern  . Not on file   Social History Narrative   Lives with wife.      REVIEW OF SYSTEMS: Constitutional: No fevers, chills, or sweats, no generalized fatigue, change in appetite Eyes: No visual changes, double vision, eye pain Ear, nose and throat: No hearing loss, ear pain, nasal congestion, sore throat Cardiovascular: No chest pain, palpitations Respiratory:  No shortness of breath at rest or with exertion, wheezes GastrointestinaI: No nausea, vomiting, diarrhea, abdominal pain, fecal incontinence Genitourinary:  No dysuria, urinary retention or frequency Musculoskeletal:  + neck pain, back pain Integumentary: No rash, pruritus, skin lesions Neurological: as above Psychiatric: No depression, insomnia, anxiety Endocrine: No palpitations, fatigue, diaphoresis, mood swings, change in appetite, change in weight, increased thirst Hematologic/Lymphatic:  No anemia, purpura, petechiae. Allergic/Immunologic: no itchy/runny eyes, nasal congestion, recent allergic reactions, rashes  PHYSICAL EXAM: Vitals:   01/14/17 1406  BP: (!) 152/100  Pulse: (!) 55   General: No acute distress, obese Head:  Normocephalic/atraumatic Eyes: Fundoscopic exam shows bilateral sharp discs, no vessel changes, exudates, or hemorrhages Neck: supple, no paraspinal tenderness, full range of motion Back: No paraspinal tenderness Heart: regular rate and rhythm Lungs: Clear to auscultation bilaterally. Vascular: No carotid bruits. Skin/Extremities: No rash, no edema Neurological Exam: Mental status: alert and oriented to person, place, and time, no dysarthria or aphasia, Fund of knowledge is appropriate.  Recent and remote memory are intact.  Attention and concentration are normal.    Able to name objects and repeat phrases. Cranial nerves: CN I: not tested CN II: pupils equal, round and reactive to light, visual fields intact, fundi unremarkable. CN III, IV, VI:  full range of motion, no  nystagmus, no ptosis CN V: facial sensation intact CN VII: upper and lower face symmetric CN VIII: hearing intact to finger rub CN IX, X: gag intact, uvula midline CN XI: sternocleidomastoid and trapezius muscles intact CN XII: tongue midline Bulk & Tone: normal, no fasciculations. Motor: 5/5 throughout with no pronator drift. Sensation: intact to light touch, cold, pin, vibration and joint position sense.  No extinction to double simultaneous stimulation.  Romberg test negative Deep Tendon Reflexes: brisk +3 throughout, with +bilateral Hoffman sign, L>R, no ankle clonus Plantar responses: downgoing bilaterally Cerebellar: no incoordination on finger to nose testing Gait: small short steps, difficulty with tandem walk Tremor: none  IMPRESSION: This is a pleasant 65 year old right-handed man with a history of hypertension, presenting for gait instability for the past 5 months. 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 MRI cervical spine with and without contrast will be ordered to assess for underlying structural abnormality. He will be referred to PT for balance therapy. Depending on MRI results, we may refer to Neurosurgery. He will follow-up in 4-5 months and knows to  call for any changes.   Thank you for allowing me to participate in the care of this patient. Please do not hesitate to call for any questions or concerns.   Ellouise Newer, M.D.  CC: Juanda Crumble, PA-C

## 2017-01-14 NOTE — Patient Instructions (Signed)
1. Schedule MRI cervical spine with and without contrast 2. Refer to Physical therapy for balance therapy 3. Follow-up in 4-5 months, call for any changes

## 2017-01-28 ENCOUNTER — Ambulatory Visit
Admission: RE | Admit: 2017-01-28 | Discharge: 2017-01-28 | Disposition: A | Discharge status: Home / Self Care | Payer: BC Managed Care – PPO | Source: Ambulatory Visit | Attending: Neurology | Admitting: Neurology

## 2017-01-28 ENCOUNTER — Other Ambulatory Visit: Payer: Self-pay

## 2017-01-28 ENCOUNTER — Telehealth: Payer: Self-pay | Admitting: Neurology

## 2017-01-28 DIAGNOSIS — G9520 Unspecified cord compression: Secondary | ICD-10-CM

## 2017-01-28 DIAGNOSIS — M542 Cervicalgia: Secondary | ICD-10-CM

## 2017-01-28 DIAGNOSIS — R2681 Unsteadiness on feet: Secondary | ICD-10-CM

## 2017-01-28 DIAGNOSIS — R292 Abnormal reflex: Secondary | ICD-10-CM

## 2017-01-28 MED ORDER — GADOBENATE DIMEGLUMINE 529 MG/ML IV SOLN
20.0000 mL | Freq: Once | INTRAVENOUS | Status: AC | PRN
  Administered 2017-01-28: 20 mL via INTRAVENOUS

## 2017-01-28 NOTE — Telephone Encounter (Signed)
STAT orders placed and faxed to [facility]

## 2017-01-28 NOTE — Telephone Encounter (Signed)
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 He will be referred to Neurosurgery asap, he expressed understanding and knows to go to the ER for any significant worsening before then.  [place], pls send asap referral, thanks

## 2017-01-31 ENCOUNTER — Ambulatory Visit: Payer: BC Managed Care – PPO | Admitting: Physical Therapy

## 2017-01-31 ENCOUNTER — Telehealth: Payer: Self-pay

## 2017-01-31 NOTE — Telephone Encounter (Signed)
Received notice that pt has appointment scheduled with [facility] on 01/31/18 @ 1:30PM with Dr. Annette Stable

## 2017-02-07 ENCOUNTER — Ambulatory Visit: Payer: BC Managed Care – PPO | Attending: Physician Assistant | Admitting: Audiology

## 2017-02-16 ENCOUNTER — Ambulatory Visit (INDEPENDENT_AMBULATORY_CARE_PROVIDER_SITE_OTHER): Payer: BC Managed Care – PPO | Admitting: Neurology

## 2017-02-16 ENCOUNTER — Encounter: Payer: Self-pay | Admitting: Neurology

## 2017-02-16 VITALS — BP 170/92 | HR 54 | Ht 67.0 in | Wt 259.0 lb

## 2017-02-16 DIAGNOSIS — Z9989 Dependence on other enabling machines and devices: Secondary | ICD-10-CM

## 2017-02-16 DIAGNOSIS — G4733 Obstructive sleep apnea (adult) (pediatric): Secondary | ICD-10-CM | POA: Diagnosis not present

## 2017-02-16 NOTE — Progress Notes (Signed)
SLEEP MEDICINE CLINIC   Provider:  Larey Seat, M D  Referring Provider: Desiree Lucy* Primary Care Physician:  Magda Kiel Gelene Mink, PA-C  Chief Complaint  Patient presents with  . Follow-up    pt is here alone, room 11. pt states that he is using the machine and tolerating it well. pt is using [employer]. no complaints    HPI:  I had the pleasure of seeing Mr. Jeffrey Parks today in a revisit on 02/16/2017. Patient had been tested for sleep apnea on 08/01/2015 and was diagnosed with a rather severe degree of obstructive sleep apnea, his AHI was 54.8, in supine sleep 67.3 , and in REM sleep 61.2 per hour.  Lowest oxygen saturation was 82% with a total desat  time of 171 minutes. 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 In the 10 cm water pressure CPAP there was a reduction of his apnea index to 2.0.  I ordered CPAP at 11 cm water for him was 2 cm EPR and he has had an excellent compliance of 100% of the last 30 days and 75% of over 4 hours daily use, averaging 5 hours and 16 minutes at night. His AHI is now 2.3 but he does have occasional and rather large air leaks. He states that he doesn't wake up from air leaks, and that he has felt a significant positive effect on clinical symptoms such as daytime sleepiness and fatigue. He endorsed the Epworth sleepiness score at 3 points, and fatigue severity at 13 points, he did not endorse any depression symptoms on a questionnaire. His air leaks were clustered around the August's ;ast week - may be due to change masks.    Consult note 2016  Jeffrey Parks is a 66 y.o. male , seen here as a referral from Dr. Magda Kiel for  a sleep consultation, Mr. Jeffrey Parks presents today upon referral of his primary care physician Dr. Everlene Farrier, who reported that the patient  had an increasing fatigue for the last 4 or 5 months and that his wife had confirmed that the patient snores. There was also some intermittent lightheadedness. He had never been evaluated in a sleep study.  He has a history of smoking also he only smoked 3 cigarettes a day and quit about a year ago. He drinks alcohol 3 or 4 drinks a week very moderately, he drinks diet coke, but not daily. No coffee and no tea.    When he presented he had also chest pain and was coughing. He does not feel excessively daytime sleepy and actually feels that his sleep quality is good. He does not have problems falling asleep and staying asleep. His wife sometimes feels he sleeps too much but he thinks that this is actually the sleep he needed all his life.   He endorsed the Epworth sleepiness score at 2 and the fatigue severity score at 18 points the geriatric depression score at one point.  Sleep habits are as follows: Usually he goes to bed shortly before Midnight , and he falls asleep very promptly. His bedroom is described as core, quiet and dark. He shares it with his wife.  Sometimes his sleep is restless but not every night usually only when he ate late.  He is not sure if his wife has witnessed any position dependent snoring.  He prefers to sleep on his side, he sleeps on more than one pillow. He will have to to 3 times at night the urge to urinate and go to the bathroom. He usually rises in the morning at about 7 AM, feeling well. He does not report any morning headaches, he usually does not have a dry mouth. He is ready to start his day. He relies on an alarm. His average nocturnal sleep time is between 6 and 7 hours. When he is not at work he will take daytime naps at home. He does not struggle with drowsiness at work. A nap at home will last about 2 hours. The naps is also refreshing and restoring. He usually naps in bed.   Sleep medical history and family sleep history:  He has no family history of sleep  disorders, no personal history of sleep walking.   Social history:   Review of Systems: Out of a complete 14 system review, the patient complains of only the following symptoms, and all other reviewed systems are negative. Snoring. No DM, HTN controlled on medications.  Epworth score 2 , Fatigue severity score 18  , depression score 2.    Social History   Social History  . Marital status: Single    Spouse name: N/A  . Number of children: N/A  . Years of education: N/A   Occupational History  . Not on file.   Social History Main Topics  . Smoking status: Former Smoker    Types: Cigarettes  . Smokeless tobacco: Never Used     Comment: Quit two years ago.  Light smoker prior.   . Alcohol use 1.8 oz/week    3 Standard drinks or equivalent per week  . Drug use: No  . Sexual activity: Not on file   Other Topics Concern  . Not on file   Social History Narrative   Lives with wife, 2 adult children and 3 grandchildren   Has 2 deceased children - 4 children total   Has PhD in literature   Professor at [employer]    Family History  Problem Relation Age of Onset  . Hypertension Mother   . Diabetes Unknown   . Sickle cell anemia Son   . Sickle cell anemia Son     Past Medical History:  Diagnosis Date  . Hypertension   . Low testosterone   . Sickle cell trait [facility])     Past Surgical History:  Procedure Laterality Date  . None      Current Outpatient Prescriptions  Medication Sig Dispense Refill  . acetaminophen (TYLENOL ARTHRITIS PAIN) 650 MG CR tablet Take 1 tablet (650 mg total) by mouth every 6 (six) hours as needed for pain. 30 tablet 0  . atenolol (TENORMIN) 100 MG tablet Take 1 tablet (100 mg total) by mouth daily. 90 tablet 3  . bisacodyl (DULCOLAX) 5 MG EC tablet Take 1 tablet (5 mg total) by mouth daily as needed for moderate constipation. 30 tablet 0  . cetirizine (ZYRTEC) 10 MG tablet Take 1 tablet (10 mg total) by mouth daily. 30 tablet 11  .  diclofenac sodium (VOLTAREN) 1 % GEL Apply 4 g topically 4 (four) times daily. 100 g 1  . fluticasone (FLONASE) 50 MCG/ACT nasal spray Place 2 sprays into both nostrils daily. 16 g 6  . furosemide (LASIX) 40 MG tablet Take 2 tablets (80 mg total) by mouth daily. 30 tablet 3  . losartan-hydrochlorothiazide (HYZAAR) 100-25 MG tablet Take 1 tablet  by mouth daily. 90 tablet 3  . meloxicam (MOBIC) 15 MG tablet Take 1 tablet (15 mg total) by mouth daily. 30 tablet 1  . naproxen (NAPROSYN) 500 MG tablet Take 1 tablet (500 mg total) by mouth 2 (two) times daily with a meal. 20 tablet 0  . polyethylene glycol powder (GLYCOLAX/MIRALAX) powder Take 17 g by mouth 2 (two) times daily as needed. 578 g 1   No current facility-administered medications for this visit.     Allergies as of 02/16/2017 - Review Complete 02/16/2017  Allergen Reaction Noted  . Norvasc [amlodipine]  09/20/2016    Vitals: BP (!) 170/92   Pulse (!) 54   Ht 5\' 7"  (1.702 m)   Wt 259 lb (117.5 kg)   BMI 40.57 kg/m  Last Weight:  Wt Readings from Last 1 Encounters:  02/16/17 259 lb (117.5 kg)   IWP:YKDX mass index is 40.57 kg/m.     Last Height:   Ht Readings from Last 1 Encounters:  02/16/17 5\' 7"  (1.702 m)    Physical exam:  General: The patient is awake, alert and appears not in acute distress. The patient is well groomed. Head: Normocephalic, atraumatic. Neck is supple. Mallampati 4,  neck circumference:19.5. Nasal airflow restricited, congested, TMJ is not  evident. Retrognathia is seen.  Cardiovascular:  Regular rate and rhythm without  murmurs or carotid bruit, and without distended neck veins. Respiratory: Lungs are clear to auscultation. Skin:  Without evidence of edema, or rash Trunk: BMI is elevated . Neurologic exam : The patient is awake and alert, oriented to place and time.   Memory subjective described as intact.  Attention span & concentration ability appears normal.  Speech is fluent, without   dysarthria, dysphonia or aphasia.  Mood and affect are appropriate.  Cranial nerves: Pupils are equal and briskly reactive to light. Hearing to finger rub intact.  Facial sensation intact to fine touch. Facial motor strength is symmetric and tongue and uvula move midline. Shoulder shrug was symmetrical.  Motor exam: Normal tone, muscle bulk and symmetric strength in all extremities. Sensory:  Fine touch, pinprick and vibration were  normal. Coordination: Rapid alternating movements in the fingers/hands was normal. Finger-to-nose maneuver  normal without evidence of ataxia, dysmetria or tremor. Gait and station: Patient walks without assistive device .Turns with 4  Steps.  Deep tendon reflexes: in the  upper and lower extremities are symmetric and intact..  The patient was advised of the nature of the diagnosed sleep disorder , the treatment options and risks for general a health and wellness arising from not treating the condition.  I spent more than 30 minutes of face to face time with the patient. Greater than 50% of time was spent in counseling and coordination of care. We have discussed the diagnosis and differential and I answered the patient's questions.    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 He has noted an improvement in focus , in alertness. He feels good on CPAP and is compliant in using it at 11 cm water.  Residual AHI is 2.3 / hr. Baseline  AHI 54.4 /hr. Order new mask. Eson in Medium.    Asencion Partridge Ogden Handlin MD  02/16/2017   CC: [place],  [address], [place] [address] [place], [place] [address]

## 2017-02-16 NOTE — Patient Instructions (Signed)
Please remember to try to maintain good sleep hygiene, which means: Keep a regular sleep and wake schedule, try not to exercise or have a meal within 2 hours of your bedtime, try to keep your bedroom conducive for sleep, that is, cool and dark, without light distractors such as an illuminated alarm clock, and refrain from watching TV right before sleep or in the middle of the night and do not keep the TV or radio on during the night. Also, try not to use or play on electronic devices at bedtime, such as your cell phone, tablet PC or laptop. If you like to read at bedtime on an electronic device, try to dim the background light as much as possible. Do not eat in the middle of the night.   We will see you next year again. Have a good nights rest !  Larey Seat, MD

## 2017-02-21 ENCOUNTER — Ambulatory Visit: Payer: BC Managed Care – PPO | Admitting: Physician Assistant

## 2017-02-21 ENCOUNTER — Ambulatory Visit: Payer: BC Managed Care – PPO | Attending: Neurology | Admitting: Physical Therapy

## 2017-02-21 DIAGNOSIS — R2689 Other abnormalities of gait and mobility: Secondary | ICD-10-CM | POA: Diagnosis not present

## 2017-02-22 NOTE — Therapy (Signed)
[facility] [address] [place], [place], [address] Phone: [phone]   Fax:  [phone]  Physical Therapy Evaluation  Patient Details  Name: Jeffrey Parks MRN: [id number] Date of Birth: 06-06-1951 Referring Provider: Ellouise Newer  Encounter Date: 02/21/2017      PT End of Session - 02/22/17 2147    Visit Number 1   Number of Visits 9   Date for PT Re-Evaluation 04/22/17   Authorization Type BCBS   PT Start Time 1309   PT Stop Time 1350   PT Time Calculation (min) 41 min   Activity Tolerance Patient tolerated treatment well   Behavior During Therapy [facility] for tasks assessed/performed      Past Medical History:  Diagnosis Date  . Hypertension   . Low testosterone   . Sickle cell trait [facility])     Past Surgical History:  Procedure Laterality Date  . None      There were no vitals filed for this visit.       Subjective Assessment - 02/21/17 1415    Subjective Dr. Trenton Gammon recommended surgery based on MRI results, but I don't want to do that at this time.  I may get another opinion.  My main concern is I don't have pain, but I have stiffness in my legs.  I have had more balance issues 4 months ago, no falls in the past 6 months.  Pt does not use assistive device.   Pertinent History Cervical spine MRI   Patient Stated Goals Pt's goals for physical therapy are to help with stiffness to go away and to be able to walk straight.   Currently in Pain? No/denies            [facility] PT Assessment - 02/21/17 1420      Assessment   Medical Diagnosis gait instability, cervicalgia   Referring Provider Ellouise Newer   Onset Date/Surgical Date 01/14/17  MD viist     Precautions   Precautions Fall;Cervical   Precaution Comments Questionable cervical concerns due to cervical spine MRI/neurosurgeon recommendation (PT unaware of full notes or precautions, but called Dr. Marchelle Folks office after eval)     Balance Screen    Has the patient fallen in the past 6 months No   Has the patient had a decrease in activity level because of a fear of falling?  Yes   Is the patient reluctant to leave their home because of a fear of falling?  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     Observation/Other Assessments   Focus on Therapeutic Outcomes (FOTO)  Funcitonal Status intake measure:40; ABC score 67.5%     Posture/Postural Control   Posture/Postural Control Postural limitations   Postural Limitations Rounded Shoulders     Tone   Assessment Location Right Lower Extremity;Left Lower Extremity     ROM / Strength  AROM / PROM / Strength AROM;Strength     AROM   Overall AROM Comments ankle dorsiflexion R 14 degrees, L 10 degrees     Strength   Overall Strength Comments Grossly tested at least 4/5 throughout lower extremities     Ambulation/Gait   Ambulation/Gait Yes   Ambulation/Gait Assistance 5: Supervision   Ambulation Distance (Feet) 120 Feet   Assistive device None   Gait Pattern Step-through pattern;Decreased step length - right;Decreased step length - left;Decreased stance time - right;Decreased stance time - left;Decreased dorsiflexion - right;Decreased dorsiflexion - left;Poor foot clearance - left;Poor foot clearance - right   Ambulation Surface Level;Indoor   Gait velocity 2.25 ft/sec     Standardized Balance Assessment    Standardized Balance Assessment Timed Up and Go Test;Berg Balance Test     Berg Balance Test   Sit to Stand Able to stand without using hands and stabilize independently   Standing Unsupported Able to stand safely 2 minutes   Sitting with Back Unsupported but Feet Supported on Floor or Stool Able to sit safely and securely 2 minutes   Stand to Sit Sits safely with minimal use of hands   Transfers Able to transfer safely, definite need of hands   Standing Unsupported with Eyes Closed Able to stand 10 seconds with supervision   Standing Ubsupported with Feet Together Able to place feet together independently and stand 1 minute safely   From Standing, Reach Forward with Outstretched Arm Can reach forward >12 cm safely (5")   From Standing Position, Pick up Object from Floor Able to pick up shoe safely and easily   From Standing Position, Turn to Look Behind Over each Shoulder Looks behind one side only/other side shows less weight shift   Turn 360 Degrees Able to turn 360 degrees safely but slowly   Standing Unsupported, Alternately Place Feet on Step/Stool Needs assistance to keep from falling or unable to try   Standing Unsupported, One Foot in Front Able to take small step independently and hold 30 seconds   Standing on One Leg Unable to try or needs assist to prevent fall   Total Score 40   Berg comment: Scores <45/56 indicates increased fall riskl     Timed Up and Go Test   Normal TUG (seconds) 21.69   TUG Comments Scores >13.5 seconds indicates increased fall risk     RLE Tone   RLE Tone Mild     LLE Tone   LLE Tone Mild            Objective measurements completed on examination: See above findings.                  PT Education - 02/22/17 2144    Education provided Yes   Education Details Results of objective testing, possible POC(discussed that PT needs to seek clarification from Dr. Annette Stable since MRI and neurosurgeon visit has been completed since PT referral  was made); disccused pt's concerns of stiffness in lower legs likely related to spinal cord compression and recommends pt follow up to Dr Annette Stable with additional questions   Person(s) Educated Patient   Methods Explanation   Comprehension Verbalized understanding                     Plan - 02/22/17 2148    Clinical Impression Statement Pt is a 66 year old male with diagnosis of gait instability referred by Dr. Delice Lesch.  Pt was subsequently referred to neurosurgery with  MRI showing possible spinal cord compression.  (PT phoned and left message for Dr. Annette Stable directly after PT eval, received word on 02/22/17 to hold further PT with patient awaiting surgery).  Pt presents with increased tone, decreased balance, decreased safety and independence with gait.  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  Pt would benefit from skilled PT to address balance and gait; however, will not proceed with therapy at this time due to recommendation from Dr. Annette Stable, with patient awaiting surgery.   History and Personal Factors relevant to plan of care: HTN, MRI, hyperreflexia, surgery recommended due to possible cervical spinal cord compression   Clinical Presentation Evolving   Clinical Presentation due to: See above   Clinical Decision Making Moderate   Clinical Impairments Affecting Rehab Potential Awaiting surgery per Dr. Annette Stable   PT Next Visit Plan Eval only at this time due to return call from Dr. Melven Sartorius office requesting no further PT as patient is awaiting surgery.   Consulted and Agree with Plan of Care Patient      Patient will benefit from skilled therapeutic intervention in order to improve the following deficits and impairments:  Abnormal gait, Decreased balance, Difficulty walking, Impaired tone  Visit Diagnosis: Other abnormalities of gait and mobility     Problem List Patient Active Problem List   Diagnosis Date Noted  . Gait instability  01/14/2017  . 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 Cervicalgia 01/14/2017  . Leg swelling 09/08/2016  . Sleep apnea 09/08/2016  . Dizziness 09/08/2016  . Fatigue 05/30/2015  . Hypogonadism male 05/22/2015  . Screening for prostate cancer 05/22/2015  . Snoring 05/22/2015  . Morbid obesity due to excess calories ([place]) 05/22/2015  . 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 Essential hypertension 05/07/2015  . Hyperglycemia 05/07/2015  . Erectile dysfunction 05/07/2015    MARRIOTT,AMY W. 02/22/2017, 9:57 PM  Frazier Butt., PT   [facility] [address] [place], [place], [address] Phone: [phone]   Fax:  [phone]  Name: Jeffrey Parks MRN: [id number] Date of Birth: 05-Apr-1951

## 2017-02-23 ENCOUNTER — Encounter: Payer: Self-pay | Admitting: Physical Therapy

## 2017-02-23 ENCOUNTER — Telehealth: Payer: Self-pay | Admitting: Physical Therapy

## 2017-02-23 NOTE — Addendum Note (Signed)
Addended by: Frazier Butt on: 02/23/2017 12:16 PM   Modules accepted: Orders

## 2017-02-23 NOTE — Therapy (Signed)
[facility] [address] [place], [place], [address] Phone: [phone]   Fax:  [phone]  Patient Details  Name: Jeffrey Parks MRN: [id number] Date of Birth: 1950/11/30 Referring Provider:  No ref. provider found  Encounter Date: 02/23/2017  Phoned Dr. Marchelle Folks office on 02/21/17 at 1500 and left message regarding PT eval and any restrictions/precautions at this time.   -Received the following message on 02/22/17:  Wells Guiles from [facility] returned you call. She said you called requesting information about restrictions for the patient. At this time, the doctor has asked to cancel therapy until after surgery. Let me know if I need to call the patient to cx appointments.  -See telephone message regarding PT's phone call to patient on 02/23/17, leaving message that remaining PT appointments cancelled per Dr. Marchelle Folks request.  Frazier Butt. 02/23/2017, 12:10 PM   Frazier Butt., [facility] [address] [place], [place], [address] Phone: [phone]   Fax:  [phone]

## 2017-02-23 NOTE — Telephone Encounter (Signed)
Left message for patient regarding follow-up from Dr. Marchelle Folks office, in which Dr. Annette Stable requests to cancel any further PT visits at this time.  Requested patient call back to this office to verify he received message of cancellation of appointments.  Mady Haagensen, PT 02/23/17 12:10 PM Phone: [phone] Fax: [phone]

## 2017-02-24 ENCOUNTER — Telehealth: Payer: Self-pay | Admitting: Physical Therapy

## 2017-02-24 NOTE — Telephone Encounter (Signed)
Spoke with patient returning his (return) phone call to me yesterday.  PT told patient exact instructions from [facility], that therapy appointments were to be cancelled until after surgery.  Recommended patient contact Dr. Marchelle Folks office as soon as possible with any follow-up questions.  PT let patient know we will be cancelling out remaining PT appointments.  Mady Haagensen, PT 02/24/17 11:25 AM Phone: [phone] Fax: [phone]

## 2017-03-04 ENCOUNTER — Ambulatory Visit: Payer: BC Managed Care – PPO | Admitting: Physical Therapy

## 2017-03-07 ENCOUNTER — Ambulatory Visit: Payer: BC Managed Care – PPO | Admitting: Physical Therapy

## 2017-03-09 ENCOUNTER — Ambulatory Visit (INDEPENDENT_AMBULATORY_CARE_PROVIDER_SITE_OTHER): Payer: BC Managed Care – PPO | Admitting: Physician Assistant

## 2017-03-09 ENCOUNTER — Encounter: Payer: Self-pay | Admitting: Physician Assistant

## 2017-03-09 VITALS — BP 168/98 | HR 72 | Temp 98.7°F | Resp 18 | Ht 68.0 in | Wt 260.0 lb

## 2017-03-09 DIAGNOSIS — I10 Essential (primary) hypertension: Secondary | ICD-10-CM | POA: Diagnosis not present

## 2017-03-09 DIAGNOSIS — M79604 Pain in right leg: Secondary | ICD-10-CM | POA: Diagnosis not present

## 2017-03-09 DIAGNOSIS — R030 Elevated blood-pressure reading, without diagnosis of hypertension: Secondary | ICD-10-CM

## 2017-03-09 DIAGNOSIS — Z13820 Encounter for screening for osteoporosis: Secondary | ICD-10-CM | POA: Diagnosis not present

## 2017-03-09 DIAGNOSIS — Z1329 Encounter for screening for other suspected endocrine disorder: Secondary | ICD-10-CM

## 2017-03-09 DIAGNOSIS — M79605 Pain in left leg: Secondary | ICD-10-CM | POA: Diagnosis not present

## 2017-03-09 DIAGNOSIS — G9520 Unspecified cord compression: Secondary | ICD-10-CM

## 2017-03-09 LAB — POC MICROSCOPIC URINALYSIS (UMFC): Mucus: ABSENT

## 2017-03-09 LAB — POCT URINALYSIS DIP (MANUAL ENTRY)
Bilirubin, UA: NEGATIVE
Blood, UA: NEGATIVE
Glucose, UA: NEGATIVE mg/dL
Ketones, POC UA: NEGATIVE mg/dL
Leukocytes, UA: NEGATIVE
Nitrite, UA: NEGATIVE
Protein Ur, POC: 100 mg/dL — AB
Spec Grav, UA: 1.015 (ref 1.010–1.025)
Urobilinogen, UA: 0.2 E.U./dL
pH, UA: 7 (ref 5.0–8.0)

## 2017-03-09 MED ORDER — TRAMADOL-ACETAMINOPHEN 37.5-325 MG PO TABS: 1.0000 | ORAL_TABLET | Freq: Four times a day (QID) | ORAL | 0 refills | Status: DC | PRN

## 2017-03-09 NOTE — Progress Notes (Signed)
Jeffrey Parks  MRN: [id number] DOB: [birth year]  PCP: Dorise Hiss, PA-C  Subjective:  Pt is a pleasant 66 year old male who presents to clinic for 3 month f/u HTN.   HTN - today's blood pressure is 168/98. Hyzaar 100-73m, atenolol 1027m He is walking about 0.5 miles 3x/week.  Home blood pressures are around 160/96, 140/88.  He was evaluated by cardiology 08/2016 with negative echo and 24-hour holter monitor.  Diagnosed with a rather severe degree of obstructive sleep apnea on 07/2015. He is using CPAP - does not feel major positive effect in the way he feels. His blood pressure has not changed much since CPAP use.   Life stressors [place]. This makes him frustrated and upset, however he fells as though he cannot deny his family.   Recent work-up by neurology, Dr. AqDelice Leschfor worsening b/l leg weakness and feeling off balance. Denies falls. MRI c-spine showing significant cord compression at C3-4 with cord edema or myelomalacia. Dr. AqDelice Leschecommended surgery, however Mr. IkKerstetterould like second opinion. He would prefer a laser operation, rather than conventional surgery. [facility]   He endorses pain of his hips and upper legs. Happens daily. Denies saddle paresthesia, loss of bowel or bladder function.   Review of Systems  Constitutional: Negative for chills and diaphoresis.  Respiratory: Negative for cough, chest tightness, shortness of breath and wheezing.   Cardiovascular: Negative for chest pain, palpitations and leg swelling.  Gastrointestinal: Negative for diarrhea, nausea and vomiting.  Musculoskeletal: Negative for neck pain.  Neurological: Positive for weakness (b/l legs). Negative for dizziness, syncope, light-headedness and headaches.  Psychiatric/Behavioral: Negative for sleep disturbance. The patient is not nervous/anxious.     Patient  Active Problem List   Diagnosis Date Noted  . Gait instability 01/14/2017  . 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 Cervicalgia 01/14/2017  . Leg swelling 09/08/2016  . Sleep apnea 09/08/2016  . Dizziness 09/08/2016  . Fatigue 05/30/2015  . Hypogonadism male 05/22/2015  . Screening for prostate cancer 05/22/2015  . Snoring 05/22/2015  . Morbid obesity due to excess calories ([place]/01/2015  . 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 Essential hypertension 05/07/2015  . Hyperglycemia 05/07/2015  . Erectile dysfunction 05/07/2015    Current Outpatient Prescriptions on File Prior to Visit  Medication Sig Dispense Refill  . acetaminophen (TYLENOL ARTHRITIS PAIN) 650 MG CR tablet Take 1 tablet (650 mg total) by mouth every 6 (six) hours as needed for pain. 30 tablet 0  . atenolol (TENORMIN) 100 MG tablet Take 1 tablet (100 mg total) by mouth daily. 90 tablet 3  . bisacodyl (DULCOLAX) 5 MG EC tablet Take 1 tablet (5 mg total) by mouth daily as needed for moderate constipation. 30 tablet 0  . cetirizine (ZYRTEC) 10 MG tablet Take 1 tablet (10 mg total) by mouth daily. 30 tablet 11  . diclofenac sodium (VOLTAREN) 1 % GEL Apply 4 g topically 4 (four) times daily. 100 g 1  . fluticasone (FLONASE) 50 MCG/ACT nasal spray Place 2 sprays into both nostrils daily. 16 g 6  . furosemide (LASIX) 40 MG tablet Take 2 tablets (80 mg total) by mouth daily. 30 tablet 3  . losartan-hydrochlorothiazide (HYZAAR) 100-25 MG tablet Take 1 tablet by mouth daily. 90 tablet 3  . meloxicam (MOBIC) 15 MG tablet Take 1 tablet (15 mg total) by mouth daily. 30 tablet 1  .  naproxen (NAPROSYN) 500 MG tablet Take 1 tablet (500 mg total) by mouth 2 (two) times daily with a meal. 20 tablet 0  . polyethylene glycol powder (GLYCOLAX/MIRALAX) powder Take 17 g by mouth 2 (two) times daily as needed. 578 g 1   No current facility-administered medications on file prior to visit.     Allergies  Allergen Reactions  . Norvasc [Amlodipine]       Objective:  BP (!) 168/98   Pulse 72   Temp 98.7 F (37.1 C) (Oral)   Resp 18   Ht 5' 8" (1.727 m)   Wt 260 lb (117.9 kg)   SpO2 98%   BMI 39.53 kg/m   Physical Exam  Constitutional: He is oriented to person, place, and time and well-developed, well-nourished, and in no distress. No distress.  Cardiovascular: Normal rate, regular rhythm and normal heart sounds.   Neurological: He is alert and oriented to person, place, and time. He has normal sensation and normal strength. He has an abnormal Tandem Gait Test. Gait (antalgic ) abnormal. GCS score is 15.  Skin: Skin is warm and dry.  Psychiatric: Mood, memory, affect and judgment normal.  Vitals reviewed.   Lab Results  Component Value Date   CHOL 151 03/09/2017   HDL 45 03/09/2017   LDLCALC 88 03/09/2017   TRIG 89 03/09/2017   CHOLHDL 3.4 03/09/2017    01/28/2017 MRI c-spine showing significant spinal and bilateral foraminal stenosis at these 2 levels with mass effect on the cervical spinal cord and cord edema or myelomalacia.  Assessment and Plan :  1. Essential hypertension 2. Elevated blood pressure reading - TSH - POCT urinalysis dipstick - POCT Microscopic Urinalysis ([facility]) - CBC with Differential/Platelet - Lipid panel - Pt has h/o elevated blood pressures. Today's blood pressure is 168/98. Suspect pain possibly contributing to elevated blood pressure. Plan to treat pain, advised pt to check home blood pressures. DASH diet encouraged. Consider adding Diltiazem or Verapamil, as he c/o edema and fatigue while taking Norvasc - improved with d/c norvasc. Consider imaging for renal artery stenosis in the future. Routine labs are pending. RTC in 1 month. 3. Screening for endocrine disorder - CMP14+EGFR - Hemoglobin A1c - Magnesium  4. Screening for osteoporosis - VITAMIN D 25 Hydroxy (Vit-D Deficiency, Fractures)  5. Cervical cord compression with myelopathy ([place]) - Ambulatory referral to Neurosurgery  6. Pain  in both lower extremities - traMADol-acetaminophen (ULTRACET) 37.5-325 MG tablet; Take 1 tablet by mouth every 6 (six) hours as needed.  Dispense: 30 tablet; Refill: 0    Mercer Pod, PA-C  [facility] 03/09/2017 11:55 AM

## 2017-03-09 NOTE — Patient Instructions (Addendum)
You will receive a phone call to schedule an appt with [facility]. If you have not received a call by Tuesday, call here and let me know. See below for DASH diet information. We need to get your blood pressure under control.   Call [facility] [phone] and request a new mask.   Take your blood pressure at home. Take it when you are not on pain medicine, then take it again when you are pain free. Write this down so you remember it and let me know if it is your pain that is raising your blood pressure.  Let me know whether or not you like the pain medication. We can change it if needed.   Come back and see me in 1 month.   Thank you for coming in today. I hope you feel we met your needs.  Feel free to call PCP if you have any questions or further requests.  Please consider signing up for MyChart if you do not already have it, as this is a great way to communicate with me.  Best,  Whitney McVey, PA-C   DASH Eating Plan DASH stands for "Dietary Approaches to Stop Hypertension." The DASH eating plan is a healthy eating plan that has been shown to reduce high blood pressure (hypertension). It may also reduce your risk for type 2 diabetes, heart disease, and stroke. The DASH eating plan may also help with weight loss. What are tips for following this plan? General guidelines  Avoid eating more than 2,300 mg (milligrams) of salt (sodium) a day. If you have hypertension, you may need to reduce your sodium intake to 1,500 mg a day.  Limit alcohol intake to no more than 1 drink a day for nonpregnant women and 2 drinks a day for men. One drink equals 12 oz of beer, 5 oz of wine, or 1 oz of hard liquor.  Work with your health care provider to maintain a healthy body weight or to lose weight. Ask what an ideal weight is for you.  Get at least 30 minutes of exercise that causes your heart to beat faster (aerobic exercise) most days of the week. Activities may include  walking, swimming, or biking.  Work with your health care provider or diet and nutrition specialist (dietitian) to adjust your eating plan to your individual calorie needs. Reading food labels  Check food labels for the amount of sodium per serving. Choose foods with less than 5 percent of the Daily Value of sodium. Generally, foods with less than 300 mg of sodium per serving fit into this eating plan.  To find whole grains, look for the word "whole" as the first word in the ingredient list. Shopping  Buy products labeled as "low-sodium" or "no salt added."  Buy fresh foods. Avoid canned foods and premade or frozen meals. Cooking  Avoid adding salt when cooking. Use salt-free seasonings or herbs instead of table salt or sea salt. Check with your health care provider or pharmacist before using salt substitutes.  Do not fry foods. Cook foods using healthy methods such as baking, boiling, grilling, and broiling instead.  Cook with heart-healthy oils, such as olive, canola, soybean, or sunflower oil. Meal planning   Eat a balanced diet that includes: ? 5 or more servings of fruits and vegetables each day. At each meal, try to fill half of your plate with fruits and vegetables. ? Up to 6-8 servings of whole grains each day. ? Less than 6 oz  of lean meat, poultry, or fish each day. A 3-oz serving of meat is about the same size as a deck of cards. One egg equals 1 oz. ? 2 servings of low-fat dairy each day. ? A serving of nuts, seeds, or beans 5 times each week. ? Heart-healthy fats. Healthy fats called Omega-3 fatty acids are found in foods such as flaxseeds and coldwater fish, like sardines, salmon, and mackerel.  Limit how much you eat of the following: ? Canned or prepackaged foods. ? Food that is high in trans fat, such as fried foods. ? Food that is high in saturated fat, such as fatty meat. ? Sweets, desserts, sugary drinks, and other foods with added sugar. ? Full-fat dairy  products.  Do not salt foods before eating.  Try to eat at least 2 vegetarian meals each week.  Eat more home-cooked food and less restaurant, buffet, and fast food.  When eating at a restaurant, ask that your food be prepared with less salt or no salt, if possible. What foods are recommended? The items listed may not be a complete list. Talk with your dietitian about what dietary choices are best for you. Grains Whole-grain or whole-wheat bread. Whole-grain or whole-wheat pasta. Brown rice. [place]. Bulgur. Whole-grain and low-sodium cereals. Pita bread. Low-fat, low-sodium crackers. Whole-wheat flour tortillas. Vegetables Fresh or frozen vegetables (raw, steamed, roasted, or grilled). Low-sodium or reduced-sodium tomato and vegetable juice. Low-sodium or reduced-sodium tomato sauce and tomato paste. Low-sodium or reduced-sodium canned vegetables. Fruits All fresh, dried, or frozen fruit. Canned fruit in natural juice (without added sugar). Meat and other protein foods Skinless chicken or [place]. Ground chicken or [place]. Pork with fat trimmed off. Fish and seafood. Egg whites. Dried beans, peas, or lentils. Unsalted nuts, nut butters, and seeds. Unsalted canned beans. Lean cuts of beef with fat trimmed off. Low-sodium, lean deli meat. Dairy Low-fat (1%) or fat-free (skim) milk. Fat-free, low-fat, or reduced-fat cheeses. Nonfat, low-sodium ricotta or cottage cheese. Low-fat or nonfat yogurt. Low-fat, low-sodium cheese. Fats and oils Soft margarine without trans fats. Vegetable oil. Low-fat, reduced-fat, or light mayonnaise and salad dressings (reduced-sodium). Canola, safflower, olive, soybean, and sunflower oils. Avocado. Seasoning and other foods Herbs. Spices. Seasoning mixes without salt. Unsalted popcorn and pretzels. Fat-free sweets. What foods are not recommended? The items listed may not be a complete list. Talk with your dietitian about what dietary choices are best for  you. Grains Baked goods made with fat, such as croissants, muffins, or some breads. Dry pasta or rice meal packs. Vegetables Creamed or fried vegetables. Vegetables in a cheese sauce. Regular canned vegetables (not low-sodium or reduced-sodium). Regular canned tomato sauce and paste (not low-sodium or reduced-sodium). Regular tomato and vegetable juice (not low-sodium or reduced-sodium). Angie Fava. Olives. Fruits Canned fruit in a light or heavy syrup. Fried fruit. Fruit in cream or butter sauce. Meat and other protein foods Fatty cuts of meat. Ribs. Fried meat. Berniece Salines. Sausage. Bologna and other processed lunch meats. Salami. Fatback. Hotdogs. Bratwurst. Salted nuts and seeds. Canned beans with added salt. Canned or smoked fish. Whole eggs or egg yolks. Chicken or [place] with skin. Dairy Whole or 2% milk, cream, and half-and-half. Whole or full-fat cream cheese. Whole-fat or sweetened yogurt. Full-fat cheese. Nondairy creamers. Whipped toppings. Processed cheese and cheese spreads. Fats and oils Butter. Stick margarine. Lard. Shortening. Ghee. Bacon fat. Tropical oils, such as coconut, palm kernel, or palm oil. Seasoning and other foods Salted popcorn and pretzels. Onion salt, garlic salt, seasoned salt,  table salt, and sea salt. Worcestershire sauce. Tartar sauce. Barbecue sauce. Teriyaki sauce. Soy sauce, including reduced-sodium. Steak sauce. Canned and packaged gravies. Fish sauce. Oyster sauce. Cocktail sauce. Horseradish that you find on the shelf. Ketchup. Mustard. Meat flavorings and tenderizers. Bouillon cubes. Hot sauce and Tabasco sauce. Premade or packaged marinades. Premade or packaged taco seasonings. Relishes. Regular salad dressings. Where to find more information:  National Heart, Lung, and [place]: https://wilson-eaton.com/  American Heart Association: www.heart.org Summary  The DASH eating plan is a healthy eating plan that has been shown to reduce high blood pressure  (hypertension). It may also reduce your risk for type 2 diabetes, heart disease, and stroke.  With the DASH eating plan, you should limit salt (sodium) intake to 2,300 mg a day. If you have hypertension, you may need to reduce your sodium intake to 1,500 mg a day.  When on the DASH eating plan, aim to eat more fresh fruits and vegetables, whole grains, lean proteins, low-fat dairy, and heart-healthy fats.  Work with your health care provider or diet and nutrition specialist (dietitian) to adjust your eating plan to your individual calorie needs. This information is not intended to replace advice given to you by your health care provider. Make sure you discuss any questions you have with your health care provider. Document Released: 05/20/2011 Document Revised: 05/24/2016 Document Reviewed: 05/24/2016 Elsevier Interactive Patient Education  2017 [employer].  IF you received an x-ray today, you will receive an invoice from [facility]. Please contact [facility] at [phone] with questions or concerns regarding your invoice.   IF you received labwork today, you will receive an invoice from [facility]. Please contact [facility] at [phone] with questions or concerns regarding your invoice.   Our billing staff will not be able to assist you with questions regarding bills from these companies.  You will be contacted with the lab results as soon as they are available. The fastest way to get your results is to activate your My Chart account. Instructions are located on the last page of this paperwork. If you have not heard from [place] regarding the results in 2 weeks, please contact this office.

## 2017-03-10 LAB — CMP14+EGFR
ALT: 15 IU/L (ref 0–44)
AST: 16 IU/L (ref 0–40)
Albumin/Globulin Ratio: 1.6 (ref 1.2–2.2)
Albumin: 4.2 g/dL (ref 3.6–4.8)
Alkaline Phosphatase: 82 IU/L (ref 39–117)
BUN/Creatinine Ratio: 12 (ref 10–24)
BUN: 16 mg/dL (ref 8–27)
Bilirubin Total: 0.6 mg/dL (ref 0.0–1.2)
CO2: 28 mmol/L (ref 20–29)
Calcium: 9.3 mg/dL (ref 8.6–10.2)
Chloride: 99 mmol/L (ref 96–106)
Creatinine, Ser: 1.31 mg/dL — ABNORMAL HIGH (ref 0.76–1.27)
GFR calc Af Amer: 66 mL/min/{1.73_m2} (ref >59)
GFR calc non Af Amer: 57 mL/min/{1.73_m2} — ABNORMAL LOW (ref >59)
Globulin, Total: 2.7 g/dL (ref 1.5–4.5)
Glucose: 174 mg/dL — ABNORMAL HIGH (ref 65–99)
Potassium: 4.6 mmol/L (ref 3.5–5.2)
Sodium: 140 mmol/L (ref 134–144)
Total Protein: 6.9 g/dL (ref 6.0–8.5)

## 2017-03-10 LAB — CBC WITH DIFFERENTIAL/PLATELET
Basophils Absolute: 0 10*3/uL (ref 0.0–0.2)
Basos: 0 %
EOS (ABSOLUTE): 0.2 10*3/uL (ref 0.0–0.4)
Eos: 3 %
Hematocrit: 41.3 % (ref 37.5–51.0)
Hemoglobin: 13.7 g/dL (ref 13.0–17.7)
Immature Grans (Abs): 0 10*3/uL (ref 0.0–0.1)
Immature Granulocytes: 0 %
Lymphocytes Absolute: 3.1 10*3/uL (ref 0.7–3.1)
Lymphs: 40 %
MCH: 30.9 pg (ref 26.6–33.0)
MCHC: 33.2 g/dL (ref 31.5–35.7)
MCV: 93 fL (ref 79–97)
Monocytes Absolute: 0.6 10*3/uL (ref 0.1–0.9)
Monocytes: 7 %
Neutrophils Absolute: 3.8 10*3/uL (ref 1.4–7.0)
Neutrophils: 50 %
Platelets: 164 10*3/uL (ref 150–379)
RBC: 4.44 x10E6/uL (ref 4.14–5.80)
RDW: 14 % (ref 12.3–15.4)
WBC: 7.7 10*3/uL (ref 3.4–10.8)

## 2017-03-10 LAB — LIPID PANEL
Chol/HDL Ratio: 3.4 ratio (ref 0.0–5.0)
Cholesterol, Total: 151 mg/dL (ref 100–199)
HDL: 45 mg/dL (ref >39)
LDL Calculated: 88 mg/dL (ref 0–99)
Triglycerides: 89 mg/dL (ref 0–149)
VLDL Cholesterol Cal: 18 mg/dL (ref 5–40)

## 2017-03-10 LAB — HEMOGLOBIN A1C
Est. average glucose Bld gHb Est-mCnc: 146 mg/dL
Hgb A1c MFr Bld: 6.7 % — ABNORMAL HIGH (ref 4.8–5.6)

## 2017-03-10 LAB — VITAMIN D 25 HYDROXY (VIT D DEFICIENCY, FRACTURES): Vit D, 25-Hydroxy: 35.6 ng/mL (ref 30.0–100.0)

## 2017-03-10 LAB — TSH: TSH: 2.91 u[IU]/mL (ref 0.450–4.500)

## 2017-03-10 LAB — MAGNESIUM: Magnesium: 2.2 mg/dL (ref 1.6–2.3)

## 2017-03-11 ENCOUNTER — Ambulatory Visit: Payer: BC Managed Care – PPO | Admitting: Physical Therapy

## 2017-03-14 ENCOUNTER — Ambulatory Visit: Payer: BC Managed Care – PPO | Admitting: Physical Therapy

## 2017-03-14 ENCOUNTER — Telehealth: Payer: Self-pay | Admitting: Physician Assistant

## 2017-03-14 NOTE — Telephone Encounter (Signed)
See note below

## 2017-03-14 NOTE — Telephone Encounter (Signed)
PATIENT WOULD LIKE Jeffrey Parks TO KNOW THAT IT WILL BE 2 MONTHS BEFORE HE CAN GET INTO [facility] FOR HIS BACK AND NECK PAIN. HE WOULD LIKE HER TO REFER HIM TO [facility]. HE SAID IT IS URGENT BECAUSE HE REALLY HURTS. HE HAS PROVIDED THE FAX NUMBER TO THE NEURO SURGERY DEPT. AT [place] [phone]. BEST PHONE [phone] (CELL) MBC

## 2017-03-16 NOTE — Progress Notes (Signed)
Advised improved diet. He is unable to exercise due to b/l leg pain - plans to be evaluated at [facility] neurosurgery. RTC in 6 months for recheck CMP and A1C.

## 2017-03-16 NOTE — Telephone Encounter (Signed)
Dara from [facility] called stating she had received medical records for pt but did not see anything pertaining to his back pain in it. I told her I would send a referral to her with the last ov as well as the diagnosis codes. She said since the pt is also being followed by neurology, she would need those records and latest MRI Report from them. I sent the referral to them and called the pt and left a message to let him know he will need to get records and MRI report sent to them as well before they can see him. Thanks!

## 2017-03-16 NOTE — Telephone Encounter (Signed)
Perfect!  Thank you so much.

## 2017-03-21 ENCOUNTER — Ambulatory Visit: Payer: BC Managed Care – PPO | Admitting: Physical Therapy

## 2017-03-24 ENCOUNTER — Encounter: Payer: Self-pay | Admitting: Physical Therapy

## 2017-03-24 NOTE — Therapy (Signed)
[facility] [address] [place], [place], [address] Phone: [phone]   Fax:  [phone]  Patient Details  Name: Jeffrey Parks MRN: [id number] Date of Birth: Jan 13, 1951 Referring Provider:  No ref. provider found  Encounter Date: 03/24/2017   PHYSICAL THERAPY DISCHARGE SUMMARY  Visits from Start of Care: 1  Current functional level related to goals / functional outcomes: See eval-Did not proceed beyond eval, due to MD recommendation to hold PT due to recommended surgery.   Remaining deficits: See eval   Education / Equipment: Unable to complete.  Plan: Patient agrees to discharge.  Patient goals were not met. Patient is being discharged due to the physician's request.  ?????      Yeng Perz W. 03/24/2017, 1:36 PM  Frazier Butt., PT   [facility] [address] [place], [place], [address] Phone: [phone]   Fax:  [phone]

## 2017-04-20 ENCOUNTER — Ambulatory Visit: Payer: BC Managed Care – PPO | Admitting: Physician Assistant

## 2017-05-31 DIAGNOSIS — E119 Type 2 diabetes mellitus without complications: Secondary | ICD-10-CM | POA: Insufficient documentation

## 2017-06-02 MED ORDER — SENNOSIDES-DOCUSATE SODIUM 8.6-50 MG PO TABS
2.00 | ORAL_TABLET | ORAL | Status: DC
Start: 2017-06-02 — End: 2017-06-02

## 2017-06-02 MED ORDER — VITAMIN E 400 UNITS PO CAPS
400.00 | ORAL_CAPSULE | ORAL | Status: DC
Start: 2017-06-03 — End: 2017-06-02

## 2017-06-02 MED ORDER — SIMETHICONE 80 MG PO CHEW
80.00 | CHEWABLE_TABLET | ORAL | Status: DC
Start: ? — End: 2017-06-02

## 2017-06-02 MED ORDER — VITAMIN A 10000 UNITS PO CAPS
10000.00 | ORAL_CAPSULE | ORAL | Status: DC
Start: 2017-06-03 — End: 2017-06-02

## 2017-06-02 MED ORDER — POLYVINYL ALCOHOL 1.4 % OP SOLN
1.00 | OPHTHALMIC | Status: DC
Start: ? — End: 2017-06-02

## 2017-06-02 MED ORDER — PREGABALIN 150 MG PO CAPS
150.00 | ORAL_CAPSULE | ORAL | Status: DC
Start: 2017-06-02 — End: 2017-06-02

## 2017-06-02 MED ORDER — CALCIUM CARBONATE-VITAMIN D 600-400 MG-UNIT PO TABS
1.00 | ORAL_TABLET | ORAL | Status: DC
Start: 2017-06-02 — End: 2017-06-02

## 2017-06-02 MED ORDER — BISACODYL 10 MG RE SUPP
10.00 | RECTAL | Status: DC
Start: ? — End: 2017-06-02

## 2017-06-02 MED ORDER — THERA PO TABS
1.00 | ORAL_TABLET | ORAL | Status: DC
Start: 2017-06-03 — End: 2017-06-02

## 2017-06-02 MED ORDER — LIDOCAINE HCL 1 % IJ SOLN
.50 | INTRAMUSCULAR | Status: DC
Start: ? — End: 2017-06-02

## 2017-06-02 MED ORDER — LIDOCAINE 5 % EX PTCH
2.00 | MEDICATED_PATCH | CUTANEOUS | Status: DC
Start: 2017-06-03 — End: 2017-06-02

## 2017-06-02 MED ORDER — CLONIDINE HCL 0.1 MG PO TABS
.10 | ORAL_TABLET | ORAL | Status: DC
Start: ? — End: 2017-06-02

## 2017-06-02 MED ORDER — HYDRALAZINE HCL 25 MG PO TABS
25.00 | ORAL_TABLET | ORAL | Status: DC
Start: ? — End: 2017-06-02

## 2017-06-02 MED ORDER — GENERIC EXTERNAL MEDICATION
Status: DC
Start: ? — End: 2017-06-02

## 2017-06-02 MED ORDER — ACETAMINOPHEN 325 MG PO TABS
975.00 | ORAL_TABLET | ORAL | Status: DC
Start: 2017-06-02 — End: 2017-06-02

## 2017-06-02 MED ORDER — POLYETHYLENE GLYCOL 3350 17 G PO PACK
17.00 | PACK | ORAL | Status: DC
Start: 2017-06-02 — End: 2017-06-02

## 2017-06-02 MED ORDER — SODIUM CHLORIDE 0.65 % NA SOLN
1.00 | NASAL | Status: DC
Start: ? — End: 2017-06-02

## 2017-06-02 MED ORDER — VITAMIN C 500 MG PO TABS
500.00 | ORAL_TABLET | ORAL | Status: DC
Start: 2017-06-03 — End: 2017-06-02

## 2017-06-02 MED ORDER — CARVEDILOL 25 MG PO TABS
25.00 | ORAL_TABLET | ORAL | Status: DC
Start: 2017-06-02 — End: 2017-06-02

## 2017-06-02 MED ORDER — METHOCARBAMOL 500 MG PO TABS
750.00 | ORAL_TABLET | ORAL | Status: DC
Start: ? — End: 2017-06-02

## 2017-06-02 MED ORDER — OXYCODONE HCL 5 MG PO TABS
2.50 | ORAL_TABLET | ORAL | Status: DC
Start: ? — End: 2017-06-02

## 2017-06-02 MED ORDER — ALUM & MAG HYDROXIDE-SIMETH 400-400-40 MG/5ML PO SUSP
30.00 | ORAL | Status: DC
Start: ? — End: 2017-06-02

## 2017-06-02 MED ORDER — ENOXAPARIN SODIUM 40 MG/0.4ML ~~LOC~~ SOLN
40.00 | SUBCUTANEOUS | Status: DC
Start: 2017-06-03 — End: 2017-06-02

## 2017-06-02 MED ORDER — RANITIDINE HCL 150 MG PO TABS
150.00 | ORAL_TABLET | ORAL | Status: DC
Start: ? — End: 2017-06-02

## 2017-06-02 MED ORDER — HYDRALAZINE HCL 20 MG/ML IJ SOLN
10.00 | INTRAMUSCULAR | Status: DC
Start: ? — End: 2017-06-02

## 2017-06-02 MED ORDER — GUAIFENESIN 100 MG/5ML PO SYRP
200.00 | ORAL_SOLUTION | ORAL | Status: DC
Start: ? — End: 2017-06-02

## 2017-06-02 MED ORDER — HYDROCHLOROTHIAZIDE 25 MG PO TABS
25.00 | ORAL_TABLET | ORAL | Status: DC
Start: 2017-06-03 — End: 2017-06-02

## 2017-06-02 MED ORDER — ZINC SULFATE 220 (50 ZN) MG PO CAPS
220.00 | ORAL_CAPSULE | ORAL | Status: DC
Start: 2017-06-03 — End: 2017-06-02

## 2017-06-02 MED ORDER — LOSARTAN POTASSIUM 50 MG PO TABS
100.00 | ORAL_TABLET | ORAL | Status: DC
Start: 2017-06-03 — End: 2017-06-02

## 2017-06-06 ENCOUNTER — Ambulatory Visit: Payer: BC Managed Care – PPO | Admitting: Physician Assistant

## 2017-06-22 ENCOUNTER — Ambulatory Visit: Payer: BC Managed Care – PPO | Admitting: Physician Assistant

## 2017-06-22 ENCOUNTER — Other Ambulatory Visit: Payer: Self-pay

## 2017-06-22 ENCOUNTER — Encounter: Payer: Self-pay | Admitting: Physician Assistant

## 2017-06-22 VITALS — BP 118/90 | HR 85 | Temp 98.6°F | Resp 16 | Ht 69.0 in | Wt 246.8 lb

## 2017-06-22 DIAGNOSIS — M4802 Spinal stenosis, cervical region: Secondary | ICD-10-CM

## 2017-06-22 DIAGNOSIS — R2681 Unsteadiness on feet: Secondary | ICD-10-CM

## 2017-06-22 DIAGNOSIS — E119 Type 2 diabetes mellitus without complications: Secondary | ICD-10-CM

## 2017-06-22 DIAGNOSIS — Z09 Encounter for follow-up examination after completed treatment for conditions other than malignant neoplasm: Secondary | ICD-10-CM | POA: Diagnosis not present

## 2017-06-22 DIAGNOSIS — G992 Myelopathy in diseases classified elsewhere: Secondary | ICD-10-CM

## 2017-06-22 DIAGNOSIS — M4712 Other spondylosis with myelopathy, cervical region: Secondary | ICD-10-CM | POA: Diagnosis not present

## 2017-06-22 DIAGNOSIS — R1312 Dysphagia, oropharyngeal phase: Secondary | ICD-10-CM | POA: Insufficient documentation

## 2017-06-22 DIAGNOSIS — E118 Type 2 diabetes mellitus with unspecified complications: Secondary | ICD-10-CM | POA: Insufficient documentation

## 2017-06-22 NOTE — Patient Instructions (Addendum)
Come back and see me in 3 months.     IF you received an x-ray today, you will receive an invoice from [facility]. Please contact [facility] at [phone] with questions or concerns regarding your invoice.   IF you received labwork today, you will receive an invoice from [facility]. Please contact [facility] at [phone] with questions or concerns regarding your invoice.   Our billing staff will not be able to assist you with questions regarding bills from these companies.  You will be contacted with the lab results as soon as they are available. The fastest way to get your results is to activate your My Chart account. Instructions are located on the last page of this paperwork. If you have not heard from [place] regarding the results in 2 weeks, please contact this office.

## 2017-06-22 NOTE — Progress Notes (Signed)
Jeffrey Parks  MRN: [id number] DOB: [birth year]  PCP: Dorise Hiss, PA-C  Subjective:  Pt is a pleasant 67 year old male who presents to clinic for hospital follow-up. He is here today with his wife.  He recently underwent surgery of C3-5 vertebral body resection with decompression of spinal cord at [facility] on 05/27/2016 for Stenosis of cervical spine with myelopathy and Oropharyngeal dysphagia. Procedure was through anterior cervical incision. He experienced HTN urgency during his hospital stay.   Since his surgery he reports improvement of his balance. He has progressed from using a walker to using a cane.   PT is coming to his home twice weekly He has not yet received pain medication from his pharmacy due to unknown problem. Endorses neck pain. Denies fever, chills, cough, fatigue, urinary symptoms, pain/swelling/drainage of surgical site.   Ortho f/u appt on 1/28 at [facility] with Louie Bun, PA    HTN - controlled on carvedilol 25mg , Hyzaar 100-25mg  hydralazine 25.Blood pressure is 118/90 today.  Readings at home are 150/110 before medication in the morning. Blood pressure is around 120/80 while taking medication. He endorses medication compliance.   Review of Systems  Constitutional: Negative for chills, diaphoresis and fever.  Respiratory: Negative for cough, chest tightness, shortness of breath and wheezing.   Cardiovascular: Positive for leg swelling (b/l). Negative for chest pain and palpitations.  Gastrointestinal: Negative for constipation.  Musculoskeletal: Positive for neck pain. Negative for neck stiffness.  Neurological: Negative for dizziness, syncope, weakness, light-headedness, numbness and headaches.    Patient Active Problem List   Diagnosis Date Noted  . Gait instability 01/14/2017  . 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 Cervicalgia 01/14/2017  . Leg swelling 09/08/2016  . Sleep apnea 09/08/2016  . Dizziness 09/08/2016  . Fatigue 05/30/2015  .  Hypogonadism male 05/22/2015  . Screening for prostate cancer 05/22/2015  . Snoring 05/22/2015  . Morbid obesity due to excess calories ([place]) 05/22/2015  . 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 Essential hypertension 05/07/2015  . Hyperglycemia 05/07/2015  . Erectile dysfunction 05/07/2015    Current Outpatient Medications on File Prior to Visit  Medication Sig Dispense Refill  . acetaminophen (TYLENOL ARTHRITIS PAIN) 650 MG CR tablet Take 1 tablet (650 mg total) by mouth every 6 (six) hours as needed for pain. 30 tablet 0  . bisacodyl (DULCOLAX) 5 MG EC tablet Take 1 tablet (5 mg total) by mouth daily as needed for moderate constipation. 30 tablet 0  . carvedilol (COREG) 25 MG tablet Take 25 mg by mouth 2 (two) times daily with a meal.    . cetirizine (ZYRTEC) 10 MG tablet Take 1 tablet (10 mg total) by mouth daily. 30 tablet 11  . furosemide (LASIX) 40 MG tablet Take 2 tablets (80 mg total) by mouth daily. 30 tablet 3  . hydrALAZINE (APRESOLINE) 25 MG tablet Take 25 mg by mouth 3 (three) times daily.    Marland Kitchen losartan-hydrochlorothiazide (HYZAAR) 100-25 MG tablet Take 1 tablet by mouth daily. 90 tablet 3  . oxyCODONE-acetaminophen (PERCOCET/ROXICET) 5-325 MG tablet Take by mouth every 4 (four) hours as needed for severe pain.    . polyethylene glycol powder (GLYCOLAX/MIRALAX) powder Take 17 g by mouth 2 (two) times daily as needed. 578 g 1  . pregabalin (LYRICA) 150 MG capsule Take 150 mg by mouth 2 (two) times daily.    Orlie Dakin Sodium (SENNA-DOCUSATE SODIUM PO) Take by mouth.    Marland Kitchen atenolol (TENORMIN) 100 MG tablet Take 1 tablet (100 mg total) by  mouth daily. (Patient not taking: Reported on 06/22/2017) 90 tablet 3  . diclofenac sodium (VOLTAREN) 1 % GEL Apply 4 g topically 4 (four) times daily. (Patient not taking: Reported on 06/22/2017) 100 g 1  . fluticasone (FLONASE) 50 MCG/ACT nasal spray Place 2 sprays into both nostrils daily. (Patient not taking: Reported on 06/22/2017) 16  g 6  . meloxicam (MOBIC) 15 MG tablet Take 1 tablet (15 mg total) by mouth daily. (Patient not taking: Reported on 06/22/2017) 30 tablet 1  . naproxen (NAPROSYN) 500 MG tablet Take 1 tablet (500 mg total) by mouth 2 (two) times daily with a meal. (Patient not taking: Reported on 06/22/2017) 20 tablet 0  . traMADol-acetaminophen (ULTRACET) 37.5-325 MG tablet Take 1 tablet by mouth every 6 (six) hours as needed. (Patient not taking: Reported on 06/22/2017) 30 tablet 0   No current facility-administered medications on file prior to visit.     Allergies  Allergen Reactions  . Norvasc [Amlodipine]      Objective:  BP 118/90   Pulse 85   Temp 98.6 F (37 C) (Oral)   Resp 16   Ht 5\' 9"  (1.753 m)   Wt 246 lb 12.8 oz (111.9 kg)   SpO2 96%   BMI 36.45 kg/m   Physical Exam  Constitutional: He is oriented to person, place, and time and well-developed, well-nourished, and in no distress. No distress.  Cardiovascular: Normal rate, regular rhythm and normal heart sounds.  Musculoskeletal:       Right ankle: He exhibits swelling.       Left ankle: He exhibits swelling.       Right lower leg: He exhibits edema.       Left lower leg: He exhibits edema.  Neurological: He is alert and oriented to person, place, and time. GCS score is 15.  Skin: Skin is warm and dry.     Psychiatric: Mood, memory, affect and judgment normal.  Vitals reviewed.   Assessment and Plan :  1. Encounter for examination following treatment at hospital 2. Gait instability 3. Stenosis of cervical spine with myelopathy 4. Oropharyngeal dysphagia - Pt presents for hospital f/u s/p C3-5 vertebral body resection with decompression of spinal cord at [facility] on 05/27/2016 for Stenosis of cervical spine with myelopathy and Oropharyngeal dysphagia. He is feeling great today, endorses improved balance. Spoke with his pharmacy re: pain medications. They cannot refill >7 days of opioids and are awaiting approval from [facility] Verbal  approval for 7 days for oxycodone 5mg  q 4 hr. Advised pt. He has PT coming to his house twice weekly. F/u appt at [facility] later this month for ortho. He has plenty of blood pressure medications at this time. RTC in 3 months.    Mercer Pod, PA-C  Primary [facility] at [place] 06/22/2017 2:34 PM

## 2017-07-05 ENCOUNTER — Ambulatory Visit: Payer: BC Managed Care – PPO | Admitting: Neurology

## 2017-07-08 ENCOUNTER — Ambulatory Visit: Payer: BC Managed Care – PPO | Admitting: Neurology

## 2017-07-19 ENCOUNTER — Telehealth: Payer: Self-pay | Admitting: Physician Assistant

## 2017-07-19 DIAGNOSIS — I160 Hypertensive urgency: Secondary | ICD-10-CM

## 2017-07-19 DIAGNOSIS — M792 Neuralgia and neuritis, unspecified: Secondary | ICD-10-CM

## 2017-07-19 MED ORDER — FUROSEMIDE 40 MG PO TABS: 80.0000 mg | ORAL_TABLET | Freq: Every day | ORAL | 2 refills | Status: DC

## 2017-07-19 NOTE — Telephone Encounter (Signed)
Copied from [place]. Topic: Quick Communication - Rx Refill/Question >> Jul 19, 2017  4:11 PM Corie Chiquito, [place] wrote: Medication: Lyrica and Lasix   Has the patient contacted their pharmacy? No  Patient calling because he needs a refill on his Lyrica and Lasix. Did advise patient to contact his pharmacy as well for his refills  Preferred Pharmacy (with phone number or street name): [facility] [place] [phone]   Agent: Please be advised that RX refills may take up to 3 business days. We ask that you follow-up with your pharmacy.

## 2017-07-19 NOTE — Telephone Encounter (Signed)
Lyrica refill.  Last OV: 06/22/17 with Mercer Pod, PA Last Refill: Not previously filled by [place]: [place] [address], [place]: 4/10 with Whitney McVey,PA

## 2017-07-20 NOTE — Telephone Encounter (Signed)
Please advise 

## 2017-07-26 MED ORDER — PREGABALIN 150 MG PO CAPS: 150.0000 mg | ORAL_CAPSULE | Freq: Two times a day (BID) | ORAL | 2 refills | Status: DC

## 2017-07-26 NOTE — Addendum Note (Signed)
Addended by: Dorise Hiss on: 07/26/2017 09:54 AM   Modules accepted: Orders

## 2017-09-09 ENCOUNTER — Other Ambulatory Visit: Payer: Self-pay | Admitting: Physician Assistant

## 2017-09-09 NOTE — Telephone Encounter (Signed)
Copied from [place] [id number]. Topic: Quick Communication - Rx Refill/Question >> Sep 09, 2017  5:03 PM Waylan Rocher, [place] wrote: Medication: carvedilol (COREG) 25 MG tablet, hydrALAZINE (APRESOLINE) 25 MG tablet  Has the patient contacted their pharmacy? Yes.   (Agent: If no, request that the patient contact the pharmacy for the refill.) Preferred Pharmacy (with phone number or street name): [facility] [id number], [address] [place] [address] Phone: [phone] Fax: [phone] Agent: Please be advised that RX refills may take up to 3 business days. We ask that you follow-up with your pharmacy.  Patient has been contacting his pharmacy for 2 weeks for the refill and is out of both medications.

## 2017-09-09 NOTE — Telephone Encounter (Signed)
Last OV: 06/22/17  PCP: [place]: [facility] [place], [place] [facility] [address] [phone] (Phone) [phone] (Fax)   This medication was prescribed at discharge from the hospital and filled by a historical provider.

## 2017-09-12 NOTE — Telephone Encounter (Signed)
Patient said he has been trying to get this for 2 weeks and is requesting to speak to [place] only.

## 2017-09-16 ENCOUNTER — Other Ambulatory Visit: Payer: Self-pay | Admitting: Physician Assistant

## 2017-09-16 DIAGNOSIS — M792 Neuralgia and neuritis, unspecified: Secondary | ICD-10-CM

## 2017-09-16 DIAGNOSIS — I160 Hypertensive urgency: Secondary | ICD-10-CM

## 2017-09-16 MED ORDER — PREGABALIN 150 MG PO CAPS: 150.0000 mg | ORAL_CAPSULE | Freq: Two times a day (BID) | ORAL | 2 refills | Status: DC

## 2017-09-16 MED ORDER — FUROSEMIDE 40 MG PO TABS: 80.0000 mg | ORAL_TABLET | Freq: Every day | ORAL | 2 refills | Status: DC

## 2017-09-19 ENCOUNTER — Telehealth: Payer: Self-pay | Admitting: *Deleted

## 2017-09-19 NOTE — Telephone Encounter (Signed)
Jeffrey Parks, Patient came in today for refills he was informed that they were called in.   He states he wanted carvedilol 25 mg tablet by mouth every 12 hours.  I do not see this in the computer that we have filled.   So I did not refill this.  Patient states he has been out for two weeks, and got it from [place].  He states he has an appointment Wednesday with you. Advise him to talk with you then.  Patient stated he has been trying to get it filled for 2 weeks, he seemed a bit confused on what he needed.

## 2017-09-20 ENCOUNTER — Other Ambulatory Visit: Payer: Self-pay | Admitting: Physician Assistant

## 2017-09-20 DIAGNOSIS — I10 Essential (primary) hypertension: Secondary | ICD-10-CM

## 2017-09-20 MED ORDER — CARVEDILOL 25 MG PO TABS: 25.0000 mg | ORAL_TABLET | Freq: Two times a day (BID) | ORAL | 3 refills | Status: DC

## 2017-09-20 NOTE — Telephone Encounter (Signed)
Earlier message asking for refill of lyrica and lasix - no mention of carvedilol. I just put in carvedilol refill which he can pick up at pharmacy.

## 2017-09-21 ENCOUNTER — Ambulatory Visit: Payer: BC Managed Care – PPO | Admitting: Physician Assistant

## 2017-09-21 NOTE — Telephone Encounter (Signed)
Phone call to [facility] on [address], he has not picked this medication up yet.   Phone call to patient, unable to reach.  If patient calls back, please let him know carvedilol is at [facility] on [address] and [address] for pick up. He also needs three month follow up with McVey, PA-C for his blood pressure. Please schedule him an appointment with McVey at his earliest convenience.

## 2017-11-21 ENCOUNTER — Encounter: Payer: Self-pay | Admitting: Physician Assistant

## 2017-11-21 ENCOUNTER — Other Ambulatory Visit: Payer: Self-pay

## 2017-11-21 ENCOUNTER — Ambulatory Visit (INDEPENDENT_AMBULATORY_CARE_PROVIDER_SITE_OTHER): Payer: BC Managed Care – PPO | Admitting: Physician Assistant

## 2017-11-21 VITALS — BP 112/86 | HR 76 | Temp 98.1°F | Resp 16 | Ht 69.0 in | Wt 271.0 lb

## 2017-11-21 DIAGNOSIS — E118 Type 2 diabetes mellitus with unspecified complications: Secondary | ICD-10-CM | POA: Diagnosis not present

## 2017-11-21 DIAGNOSIS — Z6841 Body Mass Index (BMI) 40.0 and over, adult: Secondary | ICD-10-CM | POA: Diagnosis not present

## 2017-11-21 DIAGNOSIS — Z0000 Encounter for general adult medical examination without abnormal findings: Secondary | ICD-10-CM | POA: Diagnosis not present

## 2017-11-21 DIAGNOSIS — Z13228 Encounter for screening for other metabolic disorders: Secondary | ICD-10-CM

## 2017-11-21 DIAGNOSIS — Z13220 Encounter for screening for lipoid disorders: Secondary | ICD-10-CM | POA: Diagnosis not present

## 2017-11-21 DIAGNOSIS — Z130 Encounter for screening for diseases of the blood and blood-forming organs and certain disorders involving the immune mechanism: Secondary | ICD-10-CM | POA: Diagnosis not present

## 2017-11-21 DIAGNOSIS — Z125 Encounter for screening for malignant neoplasm of prostate: Secondary | ICD-10-CM | POA: Diagnosis not present

## 2017-11-21 DIAGNOSIS — Z1329 Encounter for screening for other suspected endocrine disorder: Secondary | ICD-10-CM

## 2017-11-21 LAB — POCT URINALYSIS DIP (MANUAL ENTRY)
Bilirubin, UA: NEGATIVE
Blood, UA: NEGATIVE
Glucose, UA: NEGATIVE mg/dL
Ketones, POC UA: NEGATIVE mg/dL
Leukocytes, UA: NEGATIVE
Nitrite, UA: NEGATIVE
Protein Ur, POC: NEGATIVE mg/dL
Spec Grav, UA: 1.015 (ref 1.010–1.025)
Urobilinogen, UA: 0.2 E.U./dL
pH, UA: 5.5 (ref 5.0–8.0)

## 2017-11-21 LAB — POCT GLYCOSYLATED HEMOGLOBIN (HGB A1C): Hemoglobin A1C: 7.4 % — AB (ref 4.0–5.6)

## 2017-11-21 MED ORDER — METFORMIN HCL 500 MG PO TABS: 500.0000 mg | ORAL_TABLET | Freq: Two times a day (BID) | ORAL | 3 refills | Status: DC

## 2017-11-21 NOTE — Progress Notes (Signed)
forPrimary Care at [place], [place] [id number] [phone]  Date:  11/21/2017   Name:  Jeffrey Parks   DOB:  1950/10/08   MRN:  [id number]  PCP:  Dorise Hiss, PA-C    Chief Complaint: Annual Exam   History of Present Illness:  This is a pleasant 67 y.o. male who  has a past medical history of Hypertension, Low testosterone, and Sickle cell trait ([place]).  who is presenting for CPE. He is a professor at [employer]. He is here today with his wife.  He is fasting today.   HTN - controlled. today's blood pressure is 112/86. Hyzaar 100-60m, Coreg 25 mg, hydralazine 250m He is walking about 0.5 miles 3x/week. Home blood pressures are around 117-80's. he does not take coreg is blood pressure is in the low 100's. Denies dizziness, HA, syncope, near syncope.   OSA - Diagnosed with a rather severe degree of obstructive sleep apnea on 07/2015. He is using CPAP occasionally "I don't like it"- does not feel major positive effect in the way he feels.   H/o Stenosis of cervical spine - C3-5 vertebral body resection with decompression of spinal cord at [facility] 05/27/2016 for Stenosis of cervical spine with myelopathy and Oropharyngeal dysphagia. He is doing home exercises sometimes. His balance is steadily improving over time.   Complaints:  none Immunizations: needs Pnumovax and tdap. Declines this today Eye: 20/25 Diet: cooks at home. Fish, chicken, occasional red meat. Diet soda.  Exercise: none Fam hx: family history includes Diabetes in his unknown relative; Hypertension in his mother; Sickle cell anemia in his son and son.  Sexual hx: married.  Urinary hesitancy/frequency or nocturia: none Tobacco/alcohol/substance use: former smoker, Quit two years ago. Light smoker prior  Colonoscopy: utd (per pt)   Review of Systems:  Review of Systems  Constitutional: Negative for activity change, appetite change, chills, diaphoresis and fatigue.  HENT: Negative for  congestion, dental problem, sneezing and tinnitus.   Eyes: Negative for visual disturbance.  Respiratory: Negative for cough, chest tightness, shortness of breath and wheezing.   Cardiovascular: Negative for chest pain, palpitations and leg swelling.  Gastrointestinal: Negative for abdominal pain, blood in stool, constipation, diarrhea, nausea and vomiting.  Endocrine: Negative for polydipsia, polyphagia and polyuria.  Genitourinary: Negative for decreased urine volume, difficulty urinating, discharge, frequency, hematuria, scrotal swelling and testicular pain.  Musculoskeletal: Negative for arthralgias, back pain, neck pain and neck stiffness.  Allergic/Immunologic: Negative for environmental allergies and food allergies.  Neurological: Negative for dizziness, syncope, weakness, light-headedness and headaches.  Psychiatric/Behavioral: Negative for sleep disturbance. The patient is not nervous/anxious.     Patient Active Problem List   Diagnosis Date Noted  . Stenosis of cervical spine with myelopathy (HCElizabeth01/02/2018  . Oropharyngeal dysphagia 06/22/2017  . Type 2 diabetes mellitus without complication, without long-term current use of insulin (HCDolores01/02/2018  . Gait instability 01/14/2017  . 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 Cervicalgia 01/14/2017  . Leg swelling 09/08/2016  . Sleep apnea 09/08/2016  . Dizziness 09/08/2016  . Fatigue 05/30/2015  . Hypogonadism male 05/22/2015  . Morbid obesity due to excess calories (HCBreckenridge12/01/2015  . 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 Essential hypertension 05/07/2015  . Erectile dysfunction 05/07/2015    Prior to Admission medications   Medication Sig Start Date End Date Taking? Authorizing Provider  acetaminophen (TYLENOL ARTHRITIS PAIN) 650 MG CR tablet Take 1 tablet (650 mg total) by mouth every 6 (six) hours as needed for pain. 06/09/16  Yes Blanchie Dessert, MD  bisacodyl (DULCOLAX) 5 MG EC tablet Take 1 tablet (5 mg total) by mouth  daily as needed for moderate constipation. 12/02/16  Yes Charbel Los, Gelene Mink, PA-C  carvedilol (COREG) 25 MG tablet Take 1 tablet (25 mg total) by mouth 2 (two) times daily with a meal. 09/20/17  Yes Zeph Riebel, Gelene Mink, PA-C  furosemide (LASIX) 40 MG tablet Take 2 tablets (80 mg total) by mouth daily. 09/16/17  Yes Persephonie Hegwood, Gelene Mink, PA-C  hydrALAZINE (APRESOLINE) 25 MG tablet Take 25 mg by mouth 3 (three) times daily.   Yes [provider]  losartan-hydrochlorothiazide (HYZAAR) 100-25 MG tablet Take 1 tablet by mouth daily. 12/02/16  Yes Tristy Udovich, Gelene Mink, PA-C  oxyCODONE-acetaminophen (PERCOCET/ROXICET) 5-325 MG tablet Take by mouth every 4 (four) hours as needed for severe pain.   Yes [provider]  polyethylene glycol powder (GLYCOLAX/MIRALAX) powder Take 17 g by mouth 2 (two) times daily as needed. 05/03/16  Yes Eleyna Brugh, Gelene Mink, PA-C  pregabalin (LYRICA) 150 MG capsule Take 1 capsule (150 mg total) by mouth 2 (two) times daily. 09/16/17  Yes Fionna Merriott, Gelene Mink, PA-C  Sennosides-Docusate Sodium (SENNA-DOCUSATE SODIUM PO) Take by mouth.   Yes [provider]  cetirizine (ZYRTEC) 10 MG tablet Take 1 tablet (10 mg total) by mouth daily. Patient not taking: Reported on 11/21/2017 08/20/16   Latoi Giraldo, Gelene Mink, PA-C    Allergies  Allergen Reactions  . Norvasc [Amlodipine]     Past Surgical History:  Procedure Laterality Date  . neck surgery, Dec. 14,2018    . None      Social History   Tobacco Use  . Smoking status: Former Smoker    Types: Cigarettes  . Smokeless tobacco: Never Used  . Tobacco comment: Quit two years ago.  Light smoker prior.   Substance Use Topics  . Alcohol use: Yes    Alcohol/week: 1.8 oz    Types: 3 Standard drinks or equivalent per week  . Drug use: No    Family History  Problem Relation Age of Onset  . Hypertension Mother   . Diabetes Unknown   . Sickle cell anemia Son   . Sickle cell  anemia Son     Medication list has been reviewed and updated.  Physical Examination:  Blood pressure 112/86, pulse 76, temperature 98.1 F (36.7 C), temperature source Oral, resp. rate 16, height 5' 9" (1.753 m), weight 271 lb (122.9 kg), SpO2 97 %.  Physical Exam  Constitutional: He is oriented to person, place, and time. No distress.  HENT:  Head: Normocephalic and atraumatic.  Right Ear: External ear normal.  Left Ear: External ear normal.  Nose: Nose normal.  Mouth/Throat: Oropharynx is clear and moist. No oropharyngeal exudate.  Eyes: Pupils are equal, round, and reactive to light. Conjunctivae and EOM are normal. Right eye exhibits no discharge. No scleral icterus.  Neck: Normal range of motion. Neck supple. No tracheal deviation present. No thyromegaly present.  Cardiovascular: Normal rate, regular rhythm and intact distal pulses.  No murmur heard. Pulmonary/Chest: Effort normal and breath sounds normal. No respiratory distress. He has no wheezes.  Abdominal: Soft. Bowel sounds are normal. He exhibits no distension and no mass. There is no tenderness.  Genitourinary: Penis normal. No discharge found.  Musculoskeletal: Normal range of motion.       Right ankle: He exhibits swelling.       Left ankle: He exhibits swelling.       Right lower leg: He  exhibits edema (2+).       Left lower leg: He exhibits edema (2+).       Right foot: There is swelling.       Left foot: There is swelling.  Lymphadenopathy:    He has no cervical adenopathy.  Neurological: He is alert and oriented to person, place, and time. He has normal reflexes.  Skin: Skin is warm and dry. He is not diaphoretic.  Psychiatric: Judgment normal.    There were no vitals taken for this visit.  Results for orders placed or performed in visit on 11/21/17  POCT glycosylated hemoglobin (Hb A1C)  Result Value Ref Range   Hemoglobin A1C 7.4 (A) 4.0 - 5.6 %   HbA1c, POC (prediabetic range)  5.7 - 6.4 %   HbA1c,  POC (controlled diabetic range)  0.0 - 7.0 %  POCT urinalysis dipstick  Result Value Ref Range   Color, UA yellow yellow   Clarity, UA clear clear   Glucose, UA negative negative mg/dL   Bilirubin, UA negative negative   Ketones, POC UA negative negative mg/dL   Spec Grav, UA 1.015 1.010 - 1.025   Blood, UA negative negative   pH, UA 5.5 5.0 - 8.0   Protein Ur, POC negative negative mg/dL   Urobilinogen, UA 0.2 0.2 or 1.0 E.U./dL   Nitrite, UA Negative Negative   Leukocytes, UA Negative Negative    Assessment and Plan: 1. Annual physical exam - Pt is a pleasant 67 year old male who presents for annual exam. UTD colonoscopy. Declines routine immunizations today. He has gained about 40 lbs in 2 years. His A1C is 7.4% today. Impressed upon pt, especially considering DM, need to start exercising regularly and improve diet. He will start checking blood sugars twice daily. Start Metformin titrate up to 568m bid. Routine labs are pending. Will contact with results. RTC in 4-6 weeks for recheck.   2. Type 2 diabetes mellitus with complication, unspecified whether long term insulin use (HCC) - metFORMIN (GLUCOPHAGE) 500 MG tablet; Take 1 tablet (500 mg total) by mouth 2 (two) times daily with a meal.  Dispense: 180 tablet; Refill: 3  3. Class 3 severe obesity with body mass index (BMI) of 40.0 to 44.9 in adult, unspecified obesity type, unspecified whether serious comorbidity present (HSalung[facility]  4. Screening for endocrine, metabolic and immunity disorder - POCT glycosylated hemoglobin (Hb A1C) - CMP14+EGFR - CBC with Differential/Platelet - POCT urinalysis dipstick  5. Screening, lipid - Lipid panel  6. Screening PSA (prostate specific antigen) - PSA   WMercer Pod PA-C  Primary Care at [facility]6/03/2018 11:17 AM

## 2017-11-21 NOTE — Patient Instructions (Addendum)
Metformin Dosing (to be taken with food) Week 1: take 1/2 tablet twice a day. Week 2: take 1 tablet in the morning, 1/2 tablet at night. Week 3: take 1 tablets twice a day.  Check your blood sugar twice daily. Bring this record with you to your next appointment. Go to diabetes.org to learn more about diabetes.    Work on losing weight! Consider the app Sworkit. Fitness trackers such as smart-watches, smart-phones or Fitbits can help as well. Start wearing your compression socks. Elevate your feet 1-2 times daily.   Come back and see me in 4-6 weeks.   Diabetes Mellitus and Nutrition When you have diabetes (diabetes mellitus), it is very important to have healthy eating habits because your blood sugar (glucose) levels are greatly affected by what you eat and drink. Eating healthy foods in the appropriate amounts, at about the same times every day, can help you:  Control your blood glucose.  Lower your risk of heart disease.  Improve your blood pressure.  Reach or maintain a healthy weight.  Every person with diabetes is different, and each person has different needs for a meal plan. Your health care provider may recommend that you work with a diet and nutrition specialist (dietitian) to make a meal plan that is best for you. Your meal plan may vary depending on factors such as:  The calories you need.  The medicines you take.  Your weight.  Your blood glucose, blood pressure, and cholesterol levels.  Your activity level.  Other health conditions you have, such as heart or kidney disease.  How do carbohydrates affect me? Carbohydrates affect your blood glucose level more than any other type of food. Eating carbohydrates naturally increases the amount of glucose in your blood. Carbohydrate counting is a method for keeping track of how many carbohydrates you eat. Counting carbohydrates is important to keep your blood glucose at a healthy level, especially if you use insulin or  take certain oral diabetes medicines. It is important to know how many carbohydrates you can safely have in each meal. This is different for every person. Your dietitian can help you calculate how many carbohydrates you should have at each meal and for snack. Foods that contain carbohydrates include:  Bread, cereal, rice, pasta, and crackers.  Potatoes and corn.  Peas, beans, and lentils.  Milk and yogurt.  Fruit and juice.  Desserts, such as cakes, cookies, ice cream, and candy.  How does alcohol affect me? Alcohol can cause a sudden decrease in blood glucose (hypoglycemia), especially if you use insulin or take certain oral diabetes medicines. Hypoglycemia can be a life-threatening condition. Symptoms of hypoglycemia (sleepiness, dizziness, and confusion) are similar to symptoms of having too much alcohol. If your health care provider says that alcohol is safe for you, follow these guidelines:  Limit alcohol intake to no more than 1 drink per day for nonpregnant women and 2 drinks per day for men. One drink equals 12 oz of beer, 5 oz of wine, or 1 oz of hard liquor.  Do not drink on an empty stomach.  Keep yourself hydrated with water, diet soda, or unsweetened iced tea.  Keep in mind that regular soda, juice, and other mixers may contain a lot of sugar and must be counted as carbohydrates.  What are tips for following this plan? Reading food labels  Start by checking the serving size on the label. The amount of calories, carbohydrates, fats, and other nutrients listed on the label are based  on one serving of the food. Many foods contain more than one serving per package.  Check the total grams (g) of carbohydrates in one serving. You can calculate the number of servings of carbohydrates in one serving by dividing the total carbohydrates by 15. For example, if a food has 30 g of total carbohydrates, it would be equal to 2 servings of carbohydrates.  Check the number of grams (g)  of saturated and trans fats in one serving. Choose foods that have low or no amount of these fats.  Check the number of milligrams (mg) of sodium in one serving. Most people should limit total sodium intake to less than 2,300 mg per day.  Always check the nutrition information of foods labeled as "low-fat" or "nonfat". These foods may be higher in added sugar or refined carbohydrates and should be avoided.  Talk to your dietitian to identify your daily goals for nutrients listed on the label. Shopping  Avoid buying canned, premade, or processed foods. These foods tend to be high in fat, sodium, and added sugar.  Shop around the outside edge of the grocery store. This includes fresh fruits and vegetables, bulk grains, fresh meats, and fresh dairy. Cooking  Use low-heat cooking methods, such as baking, instead of high-heat cooking methods like deep frying.  Cook using healthy oils, such as olive, canola, or sunflower oil.  Avoid cooking with butter, cream, or high-fat meats. Meal planning  Eat meals and snacks regularly, preferably at the same times every day. Avoid going long periods of time without eating.  Eat foods high in fiber, such as fresh fruits, vegetables, beans, and whole grains. Talk to your dietitian about how many servings of carbohydrates you can eat at each meal.  Eat 4-6 ounces of lean protein each day, such as lean meat, chicken, fish, eggs, or tofu. 1 ounce is equal to 1 ounce of meat, chicken, or fish, 1 egg, or 1/4 cup of tofu.  Eat some foods each day that contain healthy fats, such as avocado, nuts, seeds, and fish. Lifestyle   Check your blood glucose regularly.  Exercise at least 30 minutes 5 or more days each week, or as told by your health care provider.  Take medicines as told by your health care provider.  Do not use any products that contain nicotine or tobacco, such as cigarettes and e-cigarettes. If you need help quitting, ask your health care  provider.  Work with a Social worker or diabetes educator to identify strategies to manage stress and any emotional and social challenges. What are some questions to ask my health care provider?  Do I need to meet with a diabetes educator?  Do I need to meet with a dietitian?  What number can I call if I have questions?  When are the best times to check my blood glucose? Where to find more information:  American Diabetes Association: diabetes.org/food-and-fitness/food  Academy of Nutrition and Dietetics: PokerClues.dk  [employer] of Diabetes and Digestive and Kidney Diseases (NIH): ContactWire.be Summary  A healthy meal plan will help you control your blood glucose and maintain a healthy lifestyle.  Working with a diet and nutrition specialist (dietitian) can help you make a meal plan that is best for you.  Keep in mind that carbohydrates and alcohol have immediate effects on your blood glucose levels. It is important to count carbohydrates and to use alcohol carefully. This information is not intended to replace advice given to you by your health care provider. Make sure  you discuss any questions you have with your health care provider. Document Released: 02/25/2005 Document Revised: 07/05/2016 Document Reviewed: 07/05/2016 Elsevier Interactive Patient Education  2018 [employer].  Metformin tablets What is this medicine? METFORMIN (met FOR min) is used to treat type 2 diabetes. It helps to control blood sugar. Treatment is combined with diet and exercise. This medicine can be used alone or with other medicines for diabetes. This medicine may be used for other purposes; ask your health care provider or pharmacist if you have questions. COMMON BRAND NAME(S): Glucophage  How should I use this medicine? Take this medicine by mouth with a glass of water.  Follow the directions on the prescription label. Take this medicine with food. Take your medicine at regular intervals. Do not take your medicine more often than directed. Do not stop taking except on your doctor's advice. Talk to your pediatrician regarding the use of this medicine in children. While this drug may be prescribed for children as young as 47 years of age for selected conditions, precautions do apply. Overdosage: If you think you have taken too much of this medicine contact a poison control center or emergency room at once. NOTE: This medicine is only for you. Do not share this medicine with others. What if I miss a dose? If you miss a dose, take it as soon as you can. If it is almost time for your next dose, take only that dose. Do not take double or extra doses. What should I watch for while using this medicine? Visit your doctor or health care professional for regular checks on your progress. A test called the HbA1C (A1C) will be monitored. This is a simple blood test. It measures your blood sugar control over the last 2 to 3 months. You will receive this test every 3 to 6 months. Learn how to check your blood sugar. Learn the symptoms of low and high blood sugar and how to manage them. Always carry a quick-source of sugar with you in case you have symptoms of low blood sugar. Examples include hard sugar candy or glucose tablets. Make sure others know that you can choke if you eat or drink when you develop serious symptoms of low blood sugar, such as seizures or unconsciousness. They must get medical help at once. Tell your doctor or health care professional if you have high blood sugar. You might need to change the dose of your medicine. If you are sick or exercising more than usual, you might need to change the dose of your medicine. Do not skip meals. Ask your doctor or health care professional if you should avoid alcohol. Many nonprescription cough and cold products contain sugar or  alcohol. These can affect blood sugar. This medicine may cause ovulation in premenopausal women who do not have regular monthly periods. This may increase your chances of becoming pregnant. You should not take this medicine if you become pregnant or think you may be pregnant. Talk with your doctor or health care professional about your birth control options while taking this medicine. Contact your doctor or health care professional right away if think you are pregnant. If you are going to need surgery, a MRI, CT scan, or other procedure, tell your doctor that you are taking this medicine. You may need to stop taking this medicine before the procedure. Wear a medical ID bracelet or chain, and carry a card that describes your disease and details of your medicine and dosage times. What side effects may  I notice from receiving this medicine? Side effects that you should report to your doctor or health care professional as soon as possible: -allergic reactions like skin rash, itching or hives, swelling of the face, lips, or tongue -breathing problems -feeling faint or lightheaded, falls -muscle aches or pains -signs and symptoms of low blood sugar such as feeling anxious, confusion, dizziness, increased hunger, unusually weak or tired, sweating, shakiness, cold, irritable, headache, blurred vision, fast heartbeat, loss of consciousness -slow or irregular heartbeat -unusual stomach pain or discomfort -unusually tired or weak Side effects that usually do not require medical attention (report to your doctor or health care professional if they continue or are bothersome): -diarrhea -headache -heartburn -metallic taste in mouth -nausea -stomach gas, upset This list may not describe all possible side effects. Call your doctor for medical advice about side effects. You may report side effects to FDA at 1-800-FDA-1088. Where should I keep my medicine? Keep out of the reach of children. Store at room  temperature between 15 and 30 degrees C (59 and 86 degrees F). Protect from moisture and light. Throw away any unused medicine after the expiration date. NOTE: This sheet is a summary. It may not cover all possible information. If you have questions about this medicine, talk to your doctor, pharmacist, or health care provider.  2018 Elsevier/Gold Standard (2015-12-10 15:34:19)    IF you received an x-ray today, you will receive an invoice from [facility]. Please contact [facility] at [phone] with questions or concerns regarding your invoice.   IF you received labwork today, you will receive an invoice from [place]. Please contact [facility] at [phone] with questions or concerns regarding your invoice.   Our billing staff will not be able to assist you with questions regarding bills from these companies.  You will be contacted with the lab results as soon as they are available. The fastest way to get your results is to activate your My Chart account. Instructions are located on the last page of this paperwork. If you have not heard from [place] regarding the results in 2 weeks, please contact this office.

## 2017-11-22 LAB — CMP14+EGFR
ALT: 13 IU/L (ref 0–44)
AST: 15 IU/L (ref 0–40)
Albumin/Globulin Ratio: 1.5 (ref 1.2–2.2)
Albumin: 3.9 g/dL (ref 3.6–4.8)
Alkaline Phosphatase: 75 IU/L (ref 39–117)
BUN/Creatinine Ratio: 13 (ref 10–24)
BUN: 27 mg/dL (ref 8–27)
Bilirubin Total: 0.4 mg/dL (ref 0.0–1.2)
CO2: 26 mmol/L (ref 20–29)
Calcium: 9.5 mg/dL (ref 8.6–10.2)
Chloride: 99 mmol/L (ref 96–106)
Creatinine, Ser: 2.11 mg/dL — ABNORMAL HIGH (ref 0.76–1.27)
GFR calc Af Amer: 37 mL/min/{1.73_m2} — ABNORMAL LOW (ref >59)
GFR calc non Af Amer: 32 mL/min/{1.73_m2} — ABNORMAL LOW (ref >59)
Globulin, Total: 2.6 g/dL (ref 1.5–4.5)
Glucose: 113 mg/dL — ABNORMAL HIGH (ref 65–99)
Potassium: 3.9 mmol/L (ref 3.5–5.2)
Sodium: 142 mmol/L (ref 134–144)
Total Protein: 6.5 g/dL (ref 6.0–8.5)

## 2017-11-22 LAB — CBC WITH DIFFERENTIAL/PLATELET
Basophils Absolute: 0 10*3/uL (ref 0.0–0.2)
Basos: 0 %
EOS (ABSOLUTE): 0.2 10*3/uL (ref 0.0–0.4)
Eos: 2 %
Hematocrit: 37.8 % (ref 37.5–51.0)
Hemoglobin: 13 g/dL (ref 13.0–17.7)
Immature Grans (Abs): 0 10*3/uL (ref 0.0–0.1)
Immature Granulocytes: 0 %
Lymphocytes Absolute: 3.4 10*3/uL — ABNORMAL HIGH (ref 0.7–3.1)
Lymphs: 51 %
MCH: 31.2 pg (ref 26.6–33.0)
MCHC: 34.4 g/dL (ref 31.5–35.7)
MCV: 91 fL (ref 79–97)
Monocytes Absolute: 0.4 10*3/uL (ref 0.1–0.9)
Monocytes: 6 %
Neutrophils Absolute: 2.8 10*3/uL (ref 1.4–7.0)
Neutrophils: 41 %
Platelets: 184 10*3/uL (ref 150–450)
RBC: 4.17 x10E6/uL (ref 4.14–5.80)
RDW: 14.2 % (ref 12.3–15.4)
WBC: 6.7 10*3/uL (ref 3.4–10.8)

## 2017-11-22 LAB — LIPID PANEL
Chol/HDL Ratio: 4.1 ratio (ref 0.0–5.0)
Cholesterol, Total: 147 mg/dL (ref 100–199)
HDL: 36 mg/dL — ABNORMAL LOW (ref >39)
LDL Calculated: 87 mg/dL (ref 0–99)
Triglycerides: 118 mg/dL (ref 0–149)
VLDL Cholesterol Cal: 24 mg/dL (ref 5–40)

## 2017-11-22 LAB — PSA: Prostate Specific Ag, Serum: 0.6 ng/mL (ref 0.0–4.0)

## 2017-12-01 ENCOUNTER — Other Ambulatory Visit: Payer: Self-pay | Admitting: Physician Assistant

## 2017-12-01 DIAGNOSIS — R944 Abnormal results of kidney function studies: Secondary | ICD-10-CM

## 2017-12-01 NOTE — Progress Notes (Signed)
Please call pt and have him come in for a lab only visit at his earliest convenience to recheck kidney function. I will call him with results.

## 2017-12-12 ENCOUNTER — Ambulatory Visit (INDEPENDENT_AMBULATORY_CARE_PROVIDER_SITE_OTHER): Payer: BC Managed Care – PPO | Admitting: Physician Assistant

## 2017-12-12 DIAGNOSIS — R944 Abnormal results of kidney function studies: Secondary | ICD-10-CM

## 2017-12-12 NOTE — Progress Notes (Signed)
Labs only visit

## 2017-12-13 ENCOUNTER — Telehealth: Payer: Self-pay | Admitting: Physician Assistant

## 2017-12-13 LAB — BASIC METABOLIC PANEL WITH GFR
BUN/Creatinine Ratio: 14 (ref 10–24)
BUN: 29 mg/dL — ABNORMAL HIGH (ref 8–27)
CO2: 25 mmol/L (ref 20–29)
Calcium: 9.4 mg/dL (ref 8.6–10.2)
Chloride: 104 mmol/L (ref 96–106)

## 2017-12-13 LAB — BASIC METABOLIC PANEL
Creatinine, Ser: 2.08 mg/dL — ABNORMAL HIGH (ref 0.76–1.27)
GFR calc Af Amer: 37 mL/min/{1.73_m2} — ABNORMAL LOW (ref >59)
GFR calc non Af Amer: 32 mL/min/{1.73_m2} — ABNORMAL LOW (ref >59)
Glucose: 163 mg/dL — ABNORMAL HIGH (ref 65–99)
Potassium: 3.7 mmol/L (ref 3.5–5.2)
Sodium: 147 mmol/L — ABNORMAL HIGH (ref 134–144)

## 2017-12-13 NOTE — Telephone Encounter (Signed)
Copied from [place] [id number]. Topic: General - Other >> Dec 13, 2017  3:54 PM Burchel, Abbi R wrote: Reason for CRM:  Pt is insisting he needs to speak to [facility].  He did not disclose details to me.  Please call pt to advise.  Pt: [phone]   JE:[id number]/YOV work

## 2017-12-14 NOTE — Telephone Encounter (Signed)
Release labs  

## 2017-12-24 ENCOUNTER — Other Ambulatory Visit: Payer: Self-pay | Admitting: Physician Assistant

## 2017-12-24 DIAGNOSIS — I10 Essential (primary) hypertension: Secondary | ICD-10-CM

## 2018-01-12 ENCOUNTER — Other Ambulatory Visit: Payer: Self-pay | Admitting: Nephrology

## 2018-01-12 DIAGNOSIS — N183 Chronic kidney disease, stage 3 unspecified: Secondary | ICD-10-CM

## 2018-01-13 ENCOUNTER — Ambulatory Visit: Payer: BC Managed Care – PPO | Admitting: Physician Assistant

## 2018-01-17 ENCOUNTER — Other Ambulatory Visit: Payer: Self-pay | Admitting: Nephrology

## 2018-01-17 DIAGNOSIS — D472 Monoclonal gammopathy: Secondary | ICD-10-CM

## 2018-01-20 ENCOUNTER — Ambulatory Visit
Admission: RE | Admit: 2018-01-20 | Discharge: 2018-01-20 | Disposition: A | Discharge status: Home / Self Care | Payer: BC Managed Care – PPO | Source: Ambulatory Visit | Attending: Nephrology | Admitting: Nephrology

## 2018-01-20 ENCOUNTER — Encounter: Payer: Self-pay | Admitting: Hematology

## 2018-01-20 DIAGNOSIS — N183 Chronic kidney disease, stage 3 unspecified: Secondary | ICD-10-CM

## 2018-01-20 DIAGNOSIS — D472 Monoclonal gammopathy: Secondary | ICD-10-CM

## 2018-01-27 ENCOUNTER — Other Ambulatory Visit: Payer: Self-pay | Admitting: Physician Assistant

## 2018-01-27 ENCOUNTER — Encounter: Payer: Self-pay | Admitting: Physician Assistant

## 2018-01-27 DIAGNOSIS — R944 Abnormal results of kidney function studies: Secondary | ICD-10-CM

## 2018-01-27 NOTE — Progress Notes (Signed)
Kidney function fails to improve. Urgent referral to nephrologist.

## 2018-02-08 ENCOUNTER — Encounter: Payer: Self-pay | Admitting: Physician Assistant

## 2018-02-08 DIAGNOSIS — N183 Chronic kidney disease, stage 3 unspecified: Secondary | ICD-10-CM | POA: Insufficient documentation

## 2018-02-14 NOTE — Progress Notes (Signed)
HEMATOLOGY/ONCOLOGY CONSULTATION NOTE  Date of Service: 02/15/2018  Patient Care Team: McVey, Gelene Mink, PA-C as PCP - General (Physician Assistant)  CHIEF COMPLAINTS/PURPOSE OF CONSULTATION:  Monoclonal Paraproteinemia   HISTORY OF PRESENTING ILLNESS:   Jeffrey Parks is a wonderful 67 y.o. male who has been referred to [place] by Dr. Pearson Grippe at [facility]  for evaluation and management of Monoclonal Paraproteinemia. He is accompanied today by his wife. The pt reports that he is doing well overall.   The pt reports that his creatinine had recently increased, which is why he was referred to Dr. Adin Hector care, and that his creatinine decreased after he stopped taking Aleve. He notes that his creatinine had been normal prior to this.   The pt notes that he has not had any concern for recent infections. He also denies any constitutional symptoms and has not noticed any new lumps or bumps. He notes that he has some fatigue but is not sure how new this is.   The pt notes that he had a cervical spine surgery in December 2018 after having significant neck pain that was associated with leg weakness. He notes that he associates some dizziness with his neck pain as well but also endorses that he takes BP medication and is not always very hydrated.   Of note prior to the patient's visit today, pt has had Bone Survey completed on 01/20/18 with results revealing No suspicious osseous lesion within the axial or appendicular skeleton.  Most recent lab results (01/09/18) of CBC w/diff and CMP is as follows: all values are WNL except for Glucose at 118, Creatinine at 1.91, GFR at 42, CO2 at 30. 01/09/18 SPEP showed an M Spike at 0.4 with immunofixation of IgM kappa. 01/09/18 SFLC showed K:L ratio at 2.25  On review of systems, pt reports occasional dizziness, some fatigue, stable ankle swelling, and denies fevers, chills, night sweats, unexpected weight loss, new bone pains, noticing any new  lumps or bumps, and any other symptoms.   On PMHx the pt reports CKD, HTN, sickle cell trait, ED, hypogonadism, DM with renal complications. 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  MEDICAL HISTORY:  Past Medical History:  Diagnosis Date  . Hypertension   . Low testosterone   . Sickle cell trait ([place])     SURGICAL HISTORY: Past Surgical History:  Procedure Laterality Date  . neck surgery, Dec. 14,2018    . None      SOCIAL HISTORY: Social History   Socioeconomic History  . Marital status: Single    Spouse name: Not on file  . Number of children: Not on file  . Years of education: Not on file  . Highest education level: Not on file  Occupational History  . Not on file  Social Needs  . Financial resource strain: Not on file  . Food insecurity:    Worry: Not on file    Inability: Not on file  . Transportation needs:    Medical: Not on file    Non-medical: Not on file  Tobacco Use  . Smoking status: Former Smoker    Types: Cigarettes  . Smokeless tobacco: Never Used  . Tobacco comment: Quit two years ago.  Light smoker prior.   Substance and Sexual Activity  . Alcohol use: Yes    Alcohol/week: 3.0 standard drinks    Types: 3 Standard drinks or equivalent per week  . Drug use: No  . Sexual  activity: Not on file  Lifestyle  . Physical activity:    Days per week: Not on file    Minutes per session: Not on file  . Stress: Not on file  Relationships  . Social connections:    Talks on phone: Not on file    Gets together: Not on file    Attends religious service: Not on file    Active member of club or organization: Not on file    Attends meetings of clubs or organizations: Not on file    Relationship status: Not on file  . Intimate partner violence:    Fear of current or ex partner: Not on file    Emotionally abused: Not on file    Physically abused: Not on file    Forced sexual activity: Not on file  Other Topics Concern  . Not on file   Social History Narrative   Lives with wife, 2 adult children and 3 grandchildren   Has 2 deceased children - 4 children total   Has PhD in literature   Professor at [employer]    FAMILY HISTORY: Family History  Problem Relation Age of Onset  . Hypertension Mother   . Diabetes Unknown   . Sickle cell anemia Son   . Sickle cell anemia Son     ALLERGIES:  is allergic to norvasc [amlodipine].  MEDICATIONS:  Current Outpatient Medications  Medication Sig Dispense Refill  . acetaminophen (TYLENOL ARTHRITIS PAIN) 650 MG CR tablet Take 1 tablet (650 mg total) by mouth every 6 (six) hours as needed for pain. 30 tablet 0  . bisacodyl (DULCOLAX) 5 MG EC tablet Take 1 tablet (5 mg total) by mouth daily as needed for moderate constipation. 30 tablet 0  . carvedilol (COREG) 25 MG tablet Take 1 tablet (25 mg total) by mouth 2 (two) times daily with a meal. 60 tablet 3  . furosemide (LASIX) 40 MG tablet Take 2 tablets (80 mg total) by mouth daily. 30 tablet 2  . hydrALAZINE (APRESOLINE) 25 MG tablet Take 25 mg by mouth 3 (three) times daily.    Marland Kitchen losartan-hydrochlorothiazide (HYZAAR) 100-25 MG tablet TAKE 1 TABLET BY MOUTH DAILY 90 tablet 1  . metFORMIN (GLUCOPHAGE) 500 MG tablet Take 1 tablet (500 mg total) by mouth 2 (two) times daily with a meal. 180 tablet 3  . oxyCODONE-acetaminophen (PERCOCET/ROXICET) 5-325 MG tablet Take by mouth every 4 (four) hours as needed for severe pain.    . polyethylene glycol powder (GLYCOLAX/MIRALAX) powder Take 17 g by mouth 2 (two) times daily as needed. 578 g 1  . pregabalin (LYRICA) 150 MG capsule Take 1 capsule (150 mg total) by mouth 2 (two) times daily. 60 capsule 2  . Sennosides-Docusate Sodium (SENNA-DOCUSATE SODIUM PO) Take by mouth.     No current facility-administered medications for this visit.     REVIEW OF SYSTEMS:    10 Point review of Systems was done is negative except as noted above.  PHYSICAL EXAMINATION: ECOG PERFORMANCE STATUS: 1  - Symptomatic but completely ambulatory  . Vitals:   02/15/18 1405  BP: (!) 136/91  Pulse: 63  Resp: 18  Temp: 98.1 F (36.7 C)  SpO2: 100%   Filed Weights   02/15/18 1405  Weight: 258 lb 9.6 oz (117.3 kg)   .Body mass index is 38.19 kg/m.  GENERAL:alert, in no acute distress and comfortable SKIN: no acute rashes, no significant lesions EYES: conjunctiva are pink and non-injected, sclera anicteric OROPHARYNX: MMM, no exudates,  no oropharyngeal erythema or ulceration NECK: supple, no JVD LYMPH:  no palpable lymphadenopathy in the cervical, axillary or inguinal regions LUNGS: clear to auscultation b/l with normal respiratory effort HEART: regular rate & rhythm ABDOMEN:  normoactive bowel sounds , non tender, not distended. Extremity: no pedal edema PSYCH: alert & oriented x 3 with fluent speech NEURO: no focal motor/sensory deficits  LABORATORY DATA:  I have reviewed the data as listed  . CBC Latest Ref Rng & Units 02/15/2018 11/21/2017 03/09/2017  WBC 4.0 - 10.3 K/uL 6.0 6.7 7.7  Hemoglobin 13.0 - 17.1 g/dL 13.4 13.0 13.7  Hematocrit 38.4 - 49.9 % 40.2 37.8 41.3  Platelets 140 - 400 K/uL 164 184 164    . CMP Latest Ref Rng & Units 02/15/2018 12/12/2017 11/21/2017  Glucose 70 - 99 mg/dL 150(H) 163(H) 113(H)  BUN 8 - 23 mg/dL 30(H) 29(H) 27  Creatinine 0.61 - 1.24 mg/dL 1.80(H) 2.08(H) 2.11(H)  Sodium 135 - 145 mmol/L 143 147(H) 142  Potassium 3.5 - 5.1 mmol/L 3.9 3.7 3.9  Chloride 98 - 111 mmol/L 103 104 99  CO2 22 - 32 mmol/L _0 Calcium 8.9 - 10.3 mg/dL 9.6 9.4 9.5  Total Protein 6.5 - 8.1 g/dL 7.1 - 6.5  Total Bilirubin 0.3 - 1.2 mg/dL 0.5 - 0.4  Alkaline Phos 38 - 126 U/L 78 - 75  AST 15 - 41 U/L 19 - 15  ALT 0 - 44 U/L 16 - 13   01/09/18 SPEP and IFE:   01/09/18 CBC w/diff and CMP     RADIOGRAPHIC STUDIES: I have personally reviewed the radiological images as listed and agreed with the findings in the report. US Renal  Result Date:  01/20/2018 CLINICAL DATA:  Chronic renal disease stage III. EXAM: RENAL / URINARY TRACT ULTRASOUND COMPLETE COMPARISON:  None. FINDINGS: Right Kidney: Length: 10.2 cm. Echogenicity within normal limits. No hydronephrosis visualized. Several simple cysts are identified in the right kidney, largest measures 1.1 cm. Left Kidney: Length: 11.6 cm. Echogenicity within normal limits. No hydronephrosis visualized. Several simple cysts are identified within the left kidney, largest in the lower pole left kidney measuring 3.3 cm. Bladder: Appears normal for degree of bladder distention. IMPRESSION: Simple cysts in both kidneys. The kidneys are otherwise unremarkable. Electronically Signed   By: Abelardo Diesel M.D.   On: 01/20/2018 18:55   Dg Bone Survey Met  Result Date: 01/20/2018 CLINICAL DATA:  Monoclonal paraproteinemia. EXAM: METASTATIC BONE SURVEY COMPARISON:  Chest x-ray dated May 07, 2015. Cervical and lumbar spine x-rays dated June 23, 2004. FINDINGS: No lytic or sclerotic lesion within the axial or appendicular skeleton. No acute osseous abnormality. Prior C3-C5 ACDF and C4 corpectomy. Advanced left greater than right facet uncovertebral hypertrophy throughout the cervical spine. Large, flowing anterior vertebral body endplate osteophytes in the thoracic spine, consistent with DISH. Mild degenerative disc disease at L1-L2 and L4-L5. Trace anterolisthesis at L4-L5. Lower lumbar facet arthropathy. Mild bilateral acromioclavicular, hip, and knee medial compartment osteoarthritis. The heart size and mediastinal contours are within normal limits. Normal pulmonary vascularity. No focal consolidation, pleural effusion, or pneumothorax. Aortoiliac atherosclerosis.  Vascular calcifications. IMPRESSION: No suspicious osseous lesion within the axial or appendicular skeleton. Electronically Signed   By: Titus Dubin M.D.   On: 01/20/2018 15:37    ASSESSMENT & PLAN:  67 y.o. male with  1. 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 Plan -Discussed patient's most recent labs from 01/09/18, no hypercalcemia at 9.8, Creatinine at 1.91,  no anemia with HGB at 13.5. M Protein at 0.4 with IgM Kappa specificity.  -Discussed the 01/20/18 Bone survey which revealed No suspicious osseous lesion within the axial or appendicular skeleton  -Discussed the recommendation to collect blood tests today, a 24 hour urine study and CT imaging which the pt agrees with -Discussed that a BM Bx would be considered if the above work up indicates its necessity, or if the pt develops new constitutional symptoms -Discussed the recommendation to pursue work up and evaluation of sleep apnea -Will see the pt back in 4 months, sooner if any new concerns    Labs today CT chest/abd/pelvis RTC with Dr Irene Limbo in 4 months with labs  All of the patients questions were answered with apparent satisfaction. The patient knows to call the clinic with any problems, questions or concerns.  The total time spent in the appt was 30 minutes and more than 50% was on counseling and direct patient cares.     Sullivan Lone MD MS AAHIVMS [facility] Hematology/Oncology Physician [facility]  (Office):       [phone] (Work cell):  [phone] (Fax):           [phone]  02/15/2018 2:38 PM  I, Baldwin Jamaica, am acting as a scribe for Dr. Irene Limbo  .I have reviewed the above documentation for accuracy and completeness, and I agree with the above. Brunetta Genera MD

## 2018-02-15 ENCOUNTER — Inpatient Hospital Stay: Payer: BC Managed Care – PPO | Attending: Hematology | Admitting: Hematology

## 2018-02-15 ENCOUNTER — Inpatient Hospital Stay: Payer: BC Managed Care – PPO

## 2018-02-15 ENCOUNTER — Telehealth: Payer: Self-pay | Admitting: Hematology

## 2018-02-15 VITALS — BP 136/91 | HR 63 | Temp 98.1°F | Resp 18 | Ht 69.0 in | Wt 258.6 lb

## 2018-02-15 DIAGNOSIS — I10 Essential (primary) hypertension: Secondary | ICD-10-CM | POA: Diagnosis not present

## 2018-02-15 DIAGNOSIS — D472 Monoclonal gammopathy: Secondary | ICD-10-CM

## 2018-02-15 LAB — CMP (CANCER CENTER ONLY)
ALBUMIN: 3.9 g/dL (ref 3.5–5.0)
ALT: 16 U/L (ref 0–44)
ANION GAP: 12 (ref 5–15)
AST: 19 U/L (ref 15–41)
Alkaline Phosphatase: 78 U/L (ref 38–126)
BUN: 30 mg/dL — ABNORMAL HIGH (ref 8–23)
CHLORIDE: 103 mmol/L (ref 98–111)
CO2: 28 mmol/L (ref 22–32)
Calcium: 9.6 mg/dL (ref 8.9–10.3)
Creatinine: 1.8 mg/dL — ABNORMAL HIGH (ref 0.61–1.24)
GFR, EST AFRICAN AMERICAN: 44 mL/min — AB (ref >60)
GFR, Estimated: 38 mL/min — ABNORMAL LOW (ref >60)
GLUCOSE: 150 mg/dL — AB (ref 70–99)
Potassium: 3.9 mmol/L (ref 3.5–5.1)
Sodium: 143 mmol/L (ref 135–145)
Total Bilirubin: 0.5 mg/dL (ref 0.3–1.2)
Total Protein: 7.1 g/dL (ref 6.5–8.1)

## 2018-02-15 LAB — CBC WITH DIFFERENTIAL/PLATELET
BASOS ABS: 0 10*3/uL (ref 0.0–0.1)
Basophils Relative: 1 %
Eosinophils Absolute: 0.2 10*3/uL (ref 0.0–0.5)
Eosinophils Relative: 3 %
HCT: 40.2 % (ref 38.4–49.9)
Hemoglobin: 13.4 g/dL (ref 13.0–17.1)
Lymphocytes Relative: 51 %
Lymphs Abs: 3 10*3/uL (ref 0.9–3.3)
MCH: 30.6 pg (ref 27.2–33.4)
MCHC: 33.4 g/dL (ref 32.0–36.0)
MCV: 91.5 fL (ref 79.3–98.0)
MONO ABS: 0.4 10*3/uL (ref 0.1–0.9)
MONOS PCT: 7 %
NEUTROS ABS: 2.3 10*3/uL (ref 1.5–6.5)
Neutrophils Relative %: 38 %
PLATELETS: 164 10*3/uL (ref 140–400)
RBC: 4.39 MIL/uL (ref 4.20–5.82)
RDW: 14 % (ref 11.0–14.6)
WBC: 6 10*3/uL (ref 4.0–10.3)

## 2018-02-15 LAB — LACTATE DEHYDROGENASE: LDH: 198 U/L — ABNORMAL HIGH (ref 98–192)

## 2018-02-15 NOTE — Telephone Encounter (Signed)
Appts scheduled AVS/Calendar printed per 9/4 los °

## 2018-02-16 LAB — MULTIPLE MYELOMA PANEL, SERUM
ALBUMIN SERPL ELPH-MCNC: 3.7 g/dL (ref 2.9–4.4)
ALPHA 1: 0.2 g/dL (ref 0.0–0.4)
Albumin/Glob SerPl: 1.4 (ref 0.7–1.7)
Alpha2 Glob SerPl Elph-Mcnc: 0.7 g/dL (ref 0.4–1.0)
B-GLOBULIN SERPL ELPH-MCNC: 0.9 g/dL (ref 0.7–1.3)
GAMMA GLOB SERPL ELPH-MCNC: 1 g/dL (ref 0.4–1.8)
GLOBULIN, TOTAL: 2.8 g/dL (ref 2.2–3.9)
IgA: 205 mg/dL (ref 61–437)
IgG (Immunoglobin G), Serum: 968 mg/dL (ref 700–1600)
IgM (Immunoglobulin M), Srm: 175 mg/dL — ABNORMAL HIGH (ref 20–172)
M PROTEIN SERPL ELPH-MCNC: 0.3 g/dL — AB
Total Protein ELP: 6.5 g/dL (ref 6.0–8.5)

## 2018-02-16 LAB — KAPPA/LAMBDA LIGHT CHAINS
Kappa free light chain: 32.5 mg/L — ABNORMAL HIGH (ref 3.3–19.4)
Kappa, lambda light chain ratio: 2.02 — ABNORMAL HIGH (ref 0.26–1.65)
Lambda free light chains: 16.1 mg/L (ref 5.7–26.3)

## 2018-02-20 ENCOUNTER — Ambulatory Visit: Payer: BC Managed Care – PPO | Admitting: Adult Health

## 2018-02-27 ENCOUNTER — Ambulatory Visit (HOSPITAL_COMMUNITY): Payer: BC Managed Care – PPO

## 2018-03-03 ENCOUNTER — Ambulatory Visit (HOSPITAL_COMMUNITY)
Admission: RE | Admit: 2018-03-03 | Discharge: 2018-03-03 | Disposition: A | Discharge status: Home / Self Care | Payer: BC Managed Care – PPO | Source: Ambulatory Visit | Attending: Hematology | Admitting: Hematology

## 2018-03-03 DIAGNOSIS — K429 Umbilical hernia without obstruction or gangrene: Secondary | ICD-10-CM | POA: Insufficient documentation

## 2018-03-03 DIAGNOSIS — D472 Monoclonal gammopathy: Secondary | ICD-10-CM | POA: Diagnosis not present

## 2018-03-03 DIAGNOSIS — I2510 Atherosclerotic heart disease of native coronary artery without angina pectoris: Secondary | ICD-10-CM | POA: Diagnosis not present

## 2018-03-03 DIAGNOSIS — I700 Atherosclerosis of aorta: Secondary | ICD-10-CM | POA: Insufficient documentation

## 2018-03-03 DIAGNOSIS — N200 Calculus of kidney: Secondary | ICD-10-CM | POA: Diagnosis not present

## 2018-03-03 DIAGNOSIS — N2889 Other specified disorders of kidney and ureter: Secondary | ICD-10-CM | POA: Diagnosis not present

## 2018-03-06 LAB — UPEP/TP, 24-HR URINE
Albumin, U: 100 %
Alpha 1, Urine: 0 %
Alpha 2, Urine: 0 %
Beta, Urine: 0 %
GAMMA UR: 0 %
TOTAL PROTEIN, URINE-UPE24: 12.8 mg/dL
TOTAL PROTEIN, URINE-UR/DAY: 128 mg/(24.h) (ref 30–150)
Total Volume: 1000

## 2018-03-18 ENCOUNTER — Other Ambulatory Visit: Payer: Self-pay | Admitting: Physician Assistant

## 2018-03-18 DIAGNOSIS — I160 Hypertensive urgency: Secondary | ICD-10-CM

## 2018-03-20 NOTE — Telephone Encounter (Signed)
Requested medication (s) are due for refill today: Yes  Requested medication (s) are on the active medication list: Yes  Last refill:  09/16/17  Future visit scheduled: Not with PCP  Notes to clinic:  Unable to refill per protocol, labs abnormal     Requested Prescriptions  Pending Prescriptions Disp Refills   furosemide (LASIX) 40 MG tablet [Pharmacy Med Name: FUROSEMIDE 40MG  TABLETS] 30 tablet 0    Sig: TAKE 2 TABLETS(80 MG) BY MOUTH DAILY     Cardiovascular:  Diuretics - Loop Failed - 03/18/2018  4:37 PM      Failed - Cr in normal range and within 360 days    Creatinine  Date Value Ref Range Status  02/15/2018 1.80 (H) 0.61 - 1.24 mg/dL Final   Creat  Date Value Ref Range Status  05/03/2016 1.11 0.70 - 1.25 mg/dL Final    Comment:      For patients > or = 66 years of age: The upper reference limit for Creatinine is approximately 13% higher for people identified as African-American.            Failed - Last BP in normal range    BP Readings from Last 1 Encounters:  02/15/18 (!) 136/91         Passed - K in normal range and within 360 days    Potassium  Date Value Ref Range Status  02/15/2018 3.9 3.5 - 5.1 mmol/L Final         Passed - Ca in normal range and within 360 days    Calcium  Date Value Ref Range Status  02/15/2018 9.6 8.9 - 10.3 mg/dL Final         Passed - Na in normal range and within 360 days    Sodium  Date Value Ref Range Status  02/15/2018 143 135 - 145 mmol/L Final  12/12/2017 147 (H) 134 - 144 mmol/L Final         Passed - Valid encounter within last 6 months    Recent Outpatient Visits          3 months ago Decreased GFR   Primary Care at [facility], [place] D, PA-C   3 months ago Annual physical exam   Primary Care at [facility], Gelene Mink, PA-C   9 months ago Encounter for examination following treatment at hospital   Primary Care at [facility], Gelene Mink, PA-C   1 year ago Essential hypertension   Primary Care at [facility], Gelene Mink, PA-C   1 year ago Essential hypertension   Primary Care at [facility], [place], [place]

## 2018-04-19 ENCOUNTER — Other Ambulatory Visit: Payer: Self-pay | Admitting: Physician Assistant

## 2018-04-19 DIAGNOSIS — I160 Hypertensive urgency: Secondary | ICD-10-CM

## 2018-04-19 NOTE — Telephone Encounter (Signed)
Requested Prescriptions  Pending Prescriptions Disp Refills  . furosemide (LASIX) 40 MG tablet [Pharmacy Med Name: FUROSEMIDE 40MG  TABLETS] 90 tablet 0    Sig: TAKE 2 TABLETS(80 MG) BY MOUTH DAILY     Cardiovascular:  Diuretics - Loop Failed - 04/19/2018  2:57 PM      Failed - Cr in normal range and within 360 days    Creatinine  Date Value Ref Range Status  02/15/2018 1.80 (H) 0.61 - 1.24 mg/dL Final   Creat  Date Value Ref Range Status  05/03/2016 1.11 0.70 - 1.25 mg/dL Final    Comment:      For patients > or = 66 years of age: The upper reference limit for Creatinine is approximately 13% higher for people identified as African-American.            Failed - Last BP in normal range    BP Readings from Last 1 Encounters:  02/15/18 (!) 136/91         Passed - K in normal range and within 360 days    Potassium  Date Value Ref Range Status  02/15/2018 3.9 3.5 - 5.1 mmol/L Final         Passed - Ca in normal range and within 360 days    Calcium  Date Value Ref Range Status  02/15/2018 9.6 8.9 - 10.3 mg/dL Final         Passed - Na in normal range and within 360 days    Sodium  Date Value Ref Range Status  02/15/2018 143 135 - 145 mmol/L Final  12/12/2017 147 (H) 134 - 144 mmol/L Final         Passed - Valid encounter within last 6 months    Recent Outpatient Visits          4 months ago Decreased GFR   Primary Care at [facility], [place] D, PA-C   4 months ago Annual physical exam   Primary Care at [facility], Gelene Mink, PA-C   10 months ago Encounter for examination following treatment at hospital   Primary Care at [facility], Gelene Mink, PA-C   1 year ago Essential hypertension   Primary Care at [facility], Gelene Mink, PA-C   1 year ago Essential hypertension   Primary Care at [facility], Gelene Mink, [place]            In 2 days [place], Gelene Mink, PA-C Primary Care at [place], [facility]

## 2018-04-21 ENCOUNTER — Other Ambulatory Visit: Payer: Self-pay

## 2018-04-21 ENCOUNTER — Encounter: Payer: Self-pay | Admitting: Physician Assistant

## 2018-04-21 ENCOUNTER — Ambulatory Visit: Payer: BC Managed Care – PPO | Admitting: Physician Assistant

## 2018-04-21 VITALS — BP 122/76 | HR 72 | Temp 98.3°F | Resp 16 | Ht 69.0 in | Wt 250.0 lb

## 2018-04-21 DIAGNOSIS — E118 Type 2 diabetes mellitus with unspecified complications: Secondary | ICD-10-CM | POA: Diagnosis not present

## 2018-04-21 DIAGNOSIS — I10 Essential (primary) hypertension: Secondary | ICD-10-CM | POA: Diagnosis not present

## 2018-04-21 DIAGNOSIS — M542 Cervicalgia: Secondary | ICD-10-CM

## 2018-04-21 DIAGNOSIS — Z13220 Encounter for screening for lipoid disorders: Secondary | ICD-10-CM

## 2018-04-21 DIAGNOSIS — G4733 Obstructive sleep apnea (adult) (pediatric): Secondary | ICD-10-CM

## 2018-04-21 LAB — POCT GLYCOSYLATED HEMOGLOBIN (HGB A1C): Hemoglobin A1C: 6.5 % — AB (ref 4.0–5.6)

## 2018-04-21 MED ORDER — HYDRALAZINE HCL 25 MG PO TABS: 25.0000 mg | ORAL_TABLET | Freq: Three times a day (TID) | ORAL | 5 refills | Status: DC

## 2018-04-21 MED ORDER — OXYCODONE HCL ER 10 MG PO T12A: 10.0000 mg | EXTENDED_RELEASE_TABLET | Freq: Two times a day (BID) | ORAL | 0 refills | Status: DC

## 2018-04-21 NOTE — Progress Notes (Signed)
Jeffrey Parks  MRN: [id number] DOB: [birth year]  PCP: Dorise Hiss, PA-C  Subjective:  Pt is a pleasant 67 year old male who presents to clinic for several complaints.  Most recently been seen by oncology for consultation regarding Monoclonal Paraproteinemia - referred by his nephrologist. Has f/u appt in 3 months.   HTN - controlled. today's blood pressure is 112/86. Hyzaar 100-25mg , Coreg 25 mg, hydralazine 25mg  Denies lightheadedness, dizziness, chronic headache, double vision, chest pain, shortness of breath, heart racing, palpitations, nausea, vomiting, abdominal pain, hematuria.  OSA - not using his CPAP. He does not like the way it feels. Has not been using this x several months.   B/l feet and ankle swelling- this has been a problem for him x several years. Is unchanged. Is worse as the day goes on. No swelling in the morning. He is not wearing compression socks.   Left knee pain and neck pain - chronic   DM -  Metformin 500 qd. Last A1C 7.4. He walks about 3x/week  Medication refills. Oxycodone.  Pt  has a past medical history of Hypertension, Low testosterone, and Sickle cell trait ([place]).  01/20/18 Bone survey which revealed No suspicious osseous lesion within the axial or appendicular skeleton   Review of Systems  Cardiovascular: Positive for leg swelling. Negative for chest pain and palpitations.  Musculoskeletal: Positive for arthralgias, gait problem and neck pain.    Patient Active Problem List   Diagnosis Date Noted  . Chronic kidney disease, stage III (moderate) ([place]) 02/08/2018  . Stenosis of cervical spine with myelopathy ([place]) 06/22/2017  . Oropharyngeal dysphagia 06/22/2017  . Type 2 diabetes mellitus with complication ([place]) 31/49/7026  . Gait instability 01/14/2017  . 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 Cervicalgia 01/14/2017  . Leg swelling 09/08/2016  . Sleep apnea 09/08/2016  . Dizziness 09/08/2016  . Fatigue 05/30/2015  . Hypogonadism  male 05/22/2015  . Morbid obesity due to excess calories ([place]) 05/22/2015  . 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 Essential hypertension 05/07/2015  . Erectile dysfunction 05/07/2015    Current Outpatient Medications on File Prior to Visit  Medication Sig Dispense Refill  . acetaminophen (TYLENOL ARTHRITIS PAIN) 650 MG CR tablet Take 1 tablet (650 mg total) by mouth every 6 (six) hours as needed for pain. 30 tablet 0  . bisacodyl (DULCOLAX) 5 MG EC tablet Take 1 tablet (5 mg total) by mouth daily as needed for moderate constipation. 30 tablet 0  . carvedilol (COREG) 25 MG tablet Take 1 tablet (25 mg total) by mouth 2 (two) times daily with a meal. 60 tablet 3  . furosemide (LASIX) 40 MG tablet TAKE 2 TABLETS(80 MG) BY MOUTH DAILY 90 tablet 0  . hydrALAZINE (APRESOLINE) 25 MG tablet Take 25 mg by mouth 3 (three) times daily.    Marland Kitchen losartan-hydrochlorothiazide (HYZAAR) 100-25 MG tablet TAKE 1 TABLET BY MOUTH DAILY 90 tablet 1  . metFORMIN (GLUCOPHAGE) 500 MG tablet Take 1 tablet (500 mg total) by mouth 2 (two) times daily with a meal. 180 tablet 3  . oxyCODONE (OXY IR/ROXICODONE) 5 MG immediate release tablet Take 5 mg by mouth every 4 (four) hours as needed for severe pain.    . polyethylene glycol powder (GLYCOLAX/MIRALAX) powder Take 17 g by mouth 2 (two) times daily as needed. 578 g 1  . pregabalin (LYRICA) 150 MG capsule Take 1 capsule (150 mg total) by mouth 2 (two) times daily. 60 capsule 2  . Sennosides-Docusate Sodium (SENNA-DOCUSATE SODIUM PO) Take by  mouth.    . oxyCODONE-acetaminophen (PERCOCET/ROXICET) 5-325 MG tablet Take by mouth every 4 (four) hours as needed for severe pain.     No current facility-administered medications on file prior to visit.     Allergies  Allergen Reactions  . Norvasc [Amlodipine]      Objective:   Blood pressure 122/76, pulse 72, temperature 98.3 F (36.8 C), temperature source Oral, resp. rate 16, height 5\' 9"  (1.753 m), weight 250 lb (113.4  kg), SpO2 97 %.  Physical Exam  Constitutional: He appears well-developed and well-nourished.  Cardiovascular: Normal rate and normal heart sounds.  Musculoskeletal:       Right ankle: He exhibits swelling.       Left ankle: He exhibits swelling.       Right lower leg: He exhibits edema.       Left lower leg: He exhibits edema.  Vitals reviewed.   Assessment and Plan :  1. Essential hypertension - Controlled. Con't medications. Discussed with pt need to wear his CPAP. He understands however con't to be reluctant due to mask not fitting right. He is amicable to sleep study referral to discuss this.  - hydrALAZINE (APRESOLINE) 25 MG tablet; Take 1 tablet (25 mg total) by mouth 3 (three) times daily.  Dispense: 180 tablet; Refill: 5  2. Type 2 diabetes mellitus with complication (HCC) - POCT glycosylated hemoglobin (Hb A1C)  3. Screening, lipid  4. Neck pain  5. OSA (obstructive sleep apnea) - Ambulatory referral to Sleep Studies   Mercer Pod, PA-C  Primary Care at [place] 04/21/2018 2:25 PM  Please note: Portions of this report may have been transcribed using dragon voice recognition software. Every effort was made to ensure accuracy; however, inadvertent computerized transcription errors may be present.

## 2018-04-21 NOTE — Patient Instructions (Addendum)
  It has been my pleasure getting to know you and your beautiful wife. I wish you the best as this is what you deserve.   Dr. Agustina Caroli is a wonderful man. Make your next appointment with him.   Be sure to wear your compression socks every day!  Try your best to wear your CPAP machine every night. This will help your heart and kidneys.   Your blood sugar looks great! Continue taking Metformin 500mg  daily. Keep it up with walking and eating healthy!   I'll miss you!!!!!!!!!!!!!!!!!  IF you received an x-ray today, you will receive an invoice from [facility]. Please contact [facility] at [phone] with questions or concerns regarding your invoice.   IF you received labwork today, you will receive an invoice from [facility]. Please contact [facility] at [phone] with questions or concerns regarding your invoice.   Our billing staff will not be able to assist you with questions regarding bills from these companies.  You will be contacted with the lab results as soon as they are available. The fastest way to get your results is to activate your My Chart account. Instructions are located on the last page of this paperwork. If you have not heard from [place] regarding the results in 2 weeks, please contact this office.

## 2018-04-26 ENCOUNTER — Telehealth: Payer: Self-pay

## 2018-04-26 NOTE — Telephone Encounter (Signed)
Pt states he is not able to obtain rx for Oxycodone.  Was told the medication requires Prior Auth.  No indication that Rx was denied.  Please reach out to [place] [phone]) re PA for Rx.  Thank you.

## 2018-04-26 NOTE — Telephone Encounter (Signed)
PA has been submitted. Waiting for a reply

## 2018-04-27 NOTE — Telephone Encounter (Signed)
Medication was approved. Called the pharmacy and they states that they have ordered the medication on November 8th and it should be at the pharmacy fairly soon and they will let the patient know when it is ready for pick up.

## 2018-05-05 ENCOUNTER — Telehealth: Payer: Self-pay | Admitting: Physician Assistant

## 2018-05-05 NOTE — Telephone Encounter (Signed)
Copied from [place] [phone]. Topic: General - Other >> May 05, 2018  9:38 AM Yvette Rack wrote: Reason for CRM: pt calling wanting to thank [facility] for the RX oxyCODONE ([place])

## 2018-05-23 ENCOUNTER — Other Ambulatory Visit: Payer: Self-pay | Admitting: Physician Assistant

## 2018-05-23 DIAGNOSIS — M792 Neuralgia and neuritis, unspecified: Secondary | ICD-10-CM

## 2018-05-23 DIAGNOSIS — M542 Cervicalgia: Secondary | ICD-10-CM

## 2018-05-23 DIAGNOSIS — I160 Hypertensive urgency: Secondary | ICD-10-CM

## 2018-05-23 DIAGNOSIS — E118 Type 2 diabetes mellitus with unspecified complications: Secondary | ICD-10-CM

## 2018-05-23 DIAGNOSIS — I10 Essential (primary) hypertension: Secondary | ICD-10-CM

## 2018-05-23 NOTE — Telephone Encounter (Signed)
Copied from [place] [id number]. Topic: Quick Communication - Rx Refill/Question >> May 23, 2018  3:51 PM Carolyn Stare wrote: Pt has a new pharmacy and is asking that he refills be sent there   carvedilol (COREG) 25 MG tablet   furosemide (LASIX) 40 MG tablet   hydrALAZINE (APRESOLINE) 25 MG tablet   losartan-hydrochlorothiazide (HYZAAR) 100-25 MG tablet   metFORMIN (GLUCOPHAGE) 500 MG tablet   oxyCODONE (OXYCONTIN) 10 mg 12 hr tablet   pregabalin (LYRICA) 150 MG capsule   [facility] [address]           Agent: Please be advised that RX refills may take up to 3 business days. We ask that you follow-up with your pharmacy.

## 2018-05-23 NOTE — Telephone Encounter (Signed)
Copied from [place] [id number]. Topic: Quick Communication - Rx Refill/Question >> May 23, 2018  3:47 PM Carolyn Stare wrote: Medication  carvedilol (COREG) 25 MG tablet       Pt has new pharmacy will need new RX sent to the new pharmacy   furosemide (LASIX) 40 MG tablet   hydrALAZINE (APRESOLINE) 25 MG tablet   losartan-hydrochlorothiazide (HYZAAR) 100-25 MG tablet   metFORMIN (GLUCOPHAGE) 500 MG tablet   oxyCODONE (OXYCONTIN) 10 mg 12 hr tablet    pregabalin (LYRICA) 150 MG capsule   [facility] [address]          Preferred Pharmacy (with phone number or street name): ***  Agent: Please be advised that RX refills may take up to 3 business days. We ask that you follow-up with your pharmacy.

## 2018-05-25 NOTE — Telephone Encounter (Signed)
Requested medication (s) are due for refill today: Yes  Requested medication (s) are on the active medication list: Yes  Last refilled: by [facility]  Future visit scheduled: No  Notes to clinic:  Patient switching pharmacies, [facility], see notes for more explanation.     Requested Prescriptions  Pending Prescriptions Disp Refills   carvedilol (COREG) 25 MG tablet 60 tablet 3    Sig: Take 1 tablet (25 mg total) by mouth 2 (two) times daily with a meal.     Cardiovascular:  Beta Blockers Passed - 05/25/2018 12:45 PM      Passed - Last BP in normal range    BP Readings from Last 1 Encounters:  04/21/18 122/76         Passed - Last Heart Rate in normal range    Pulse Readings from Last 1 Encounters:  04/21/18 72         Passed - Valid encounter within last 6 months    Recent Outpatient Visits          1 month ago Essential hypertension   Primary Care at [facility], [place], PA-C   5 months ago Decreased GFR   [facility] at [facility], [place], PA-C   6 months ago Annual physical exam   Primary Care at [facility], Gel[place] Mink, PA-C   11 months ago Encounter for examination following treatment at hospital   Primary Care at [facility], Gelene Mink, PA-C   1 year ago Essential hypertension   Primary Care at [facility], Gelene Mink, PA-C            furosemide (LASIX) 40 MG tablet 90 tablet 0     Cardiovascular:  Diuretics - Loop Failed - 05/25/2018 12:45 PM      Failed - Cr in normal range and within 360 days    Creatinine  Date Value Ref Range Status  02/15/2018 1.80 (H) 0.61 - 1.24 mg/dL Final   Creat  Date Value Ref Range Status  05/03/2016 1.11 0.70 - 1.25 mg/dL Final    Comment:      For patients > or = 66 years of age: The upper reference limit for Creatinine is approximately 13% higher for people identified as African-American.            Passed - K in normal range and  within 360 days    Potassium  Date Value Ref Range Status  02/15/2018 3.9 3.5 - 5.1 mmol/L Final         Passed - Ca in normal range and within 360 days    Calcium  Date Value Ref Range Status  02/15/2018 9.6 8.9 - 10.3 mg/dL Final         Passed - Na in normal range and within 360 days    Sodium  Date Value Ref Range Status  02/15/2018 143 135 - 145 mmol/L Final  12/12/2017 147 (H) 134 - 144 mmol/L Final         Passed - Last BP in normal range    BP Readings from Last 1 Encounters:  04/21/18 122/76         Passed - Valid encounter within last 6 months    Recent Outpatient Visits          1 month ago Essential hypertension   Primary Care at [facility], [place], PA-C   5 months ago Decreased GFR   [facility] at  [facility], [place], PA-C   6 months ago Annual physical exam   Primary Care at [facility], [place], [place]   11 months ago Encounter for examination following treatment at hospital   Primary Care at [facility], Gelene Mink, PA-C   1 year ago Essential hypertension   Primary Care at [facility], Gelene Mink, PA-C            hydrALAZINE (APRESOLINE) 25 MG tablet 180 tablet 5    Sig: Take 1 tablet (25 mg total) by mouth 3 (three) times daily.     Cardiovascular:  Vasodilators Passed - 05/25/2018 12:45 PM      Passed - HCT in normal range and within 360 days    HCT  Date Value Ref Range Status  02/15/2018 40.2 38.4 - 49.9 % Final   Hematocrit  Date Value Ref Range Status  11/21/2017 37.8 37.5 - 51.0 % Final         Passed - HGB in normal range and within 360 days    Hemoglobin  Date Value Ref Range Status  02/15/2018 13.4 13.0 - 17.1 g/dL Final  11/21/2017 13.0 13.0 - 17.7 g/dL Final         Passed - RBC in normal range and within 360 days    RBC  Date Value Ref Range Status  02/15/2018 4.39 4.20 - 5.82 MIL/uL Final         Passed - WBC in normal range and within 360 days    WBC  Date Value Ref  Range Status  02/15/2018 6.0 4.0 - 10.3 K/uL Final         Passed - PLT in normal range and within 360 days    Platelets  Date Value Ref Range Status  02/15/2018 164 140 - 400 K/uL Final  11/21/2017 184 150 - 450 x10E3/uL Final   Platelet Count, POC  Date Value Ref Range Status  05/03/2016 188 142 - 424 K/uL Final         Passed - Last BP in normal range    BP Readings from Last 1 Encounters:  04/21/18 122/76         Passed - Valid encounter within last 12 months    Recent Outpatient Visits          1 month ago Essential hypertension   Primary Care at [facility], [place], PA-C   5 months ago Decreased GFR   [facility] at [facility], [place], PA-C   6 months ago Annual physical exam   Primary Care at [facility], Gelene Mink, PA-C   11 months ago Encounter for examination following treatment at hospital   Primary Care at [facility], Gelene Mink, PA-C   1 year ago Essential hypertension   Primary Care at [facility], [place], PA-C            losartan-hydrochlorothiazide (HYZAAR) 100-25 MG tablet 90 tablet 1    Sig: Take 1 tablet by mouth daily.     Cardiovascular: ARB + Diuretic Combos Failed - 05/25/2018 12:45 PM      Failed - Cr in normal range and within 180 days    Creatinine  Date Value Ref Range Status  02/15/2018 1.80 (H) 0.61 - 1.24 mg/dL Final   Creat  Date Value Ref Range Status  05/03/2016 1.11 0.70 - 1.25 mg/dL Final    Comment:      For patients > or = 66 years of age: The upper  reference limit for Creatinine is approximately 13% higher for people identified as African-American.            Passed - K in normal range and within 180 days    Potassium  Date Value Ref Range Status  02/15/2018 3.9 3.5 - 5.1 mmol/L Final         Passed - Na in normal range and within 180 days    Sodium  Date Value Ref Range Status  02/15/2018 143 135 - 145 mmol/L Final  12/12/2017 147 (H) 134 - 144 mmol/L Final          Passed - Ca in normal range and within 180 days    Calcium  Date Value Ref Range Status  02/15/2018 9.6 8.9 - 10.3 mg/dL Final         Passed - Patient is not pregnant      Passed - Last BP in normal range    BP Readings from Last 1 Encounters:  04/21/18 122/76         Passed - Valid encounter within last 6 months    Recent Outpatient Visits          1 month ago Essential hypertension   Primary Care at [facility], [place], PA-C   5 months ago Decreased GFR   Primary Care at [place], [place], PA-C   6 months ago Annual physical exam   Primary Care at [facility], Gelene Mink, PA-C   11 months ago Encounter for examination following treatment at hospital   Primary Care at [facility], Gelene Mink, PA-C   1 year ago Essential hypertension   Primary Care at [facility], Gelene Mink, PA-C            metFORMIN (GLUCOPHAGE) 500 MG tablet 180 tablet 3    Sig: Take 1 tablet (500 mg total) by mouth 2 (two) times daily with a meal.     Endocrinology:  Diabetes - Biguanides Failed - 05/25/2018 12:45 PM      Failed - Cr in normal range and within 360 days    Creatinine  Date Value Ref Range Status  02/15/2018 1.80 (H) 0.61 - 1.24 mg/dL Final   Creat  Date Value Ref Range Status  05/03/2016 1.11 0.70 - 1.25 mg/dL Final    Comment:      For patients > or = 66 years of age: The upper reference limit for Creatinine is approximately 13% higher for people identified as African-American.            Failed - eGFR in normal range and within 360 days    GFR, Est African American  Date Value Ref Range Status  05/03/2016 81 >=60 mL/min Final   GFR, Est AFR Am  Date Value Ref Range Status  02/15/2018 44 (L) >60 mL/min Final    Comment:    (NOTE) The eGFR has been calculated using the CKD EPI equation. This calculation has not been validated in all clinical situations. eGFR's persistently <60 mL/min signify possible Chronic  Kidney Disease.    GFR, Est Non African American  Date Value Ref Range Status  05/03/2016 70 >=60 mL/min Final   GFR, Est Non Af Am  Date Value Ref Range Status  02/15/2018 38 (L) >60 mL/min Final         Passed - HBA1C is between 0 and 7.9 and within 180 days    Hemoglobin A1C  Date Value Ref Range Status  04/21/2018 6.5 (A)  4.0 - 5.6 % Final   Hgb A1c MFr Bld  Date Value Ref Range Status  03/09/2017 6.7 (H) 4.8 - 5.6 % Final    Comment:             Prediabetes: 5.7 - 6.4          Diabetes: >6.4          Glycemic control for adults with diabetes: <7.0          Passed - Valid encounter within last 6 months    Recent Outpatient Visits          1 month ago Essential hypertension   Primary Care at [facility], [place], PA-C   5 months ago Decreased GFR   Primary Care at [place], [place], PA-C   6 months ago Annual physical exam   Primary Care at [facility], Gelene Mink, PA-C   11 months ago Encounter for examination following treatment at hospital   Primary Care at [facility], Gelene Mink, PA-C   1 year ago Essential hypertension   Primary Care at [facility], [place], [place]            oxyCODONE (OXYCONTIN) 10 mg 12 hr tablet 28 tablet 0    Sig: Take 1 tablet (10 mg total) by mouth every 12 (twelve) hours.     Not Delegated - Analgesics:  Opioid Agonists Failed - 05/25/2018 12:45 PM      Failed - This refill cannot be delegated      Failed - Urine Drug Screen completed in last 360 days.      Passed - Valid encounter within last 6 months    Recent Outpatient Visits          1 month ago Essential hypertension   Primary Care at [facility], [place], PA-C   5 months ago Decreased GFR   Primary Care at [facility], [place], PA-C   6 months ago Annual physical exam   Primary Care at [facility], [place], PA-C   11 months ago Encounter for examination following treatment at hospital    Primary Care at [facility], Gelene Mink, PA-C   1 year ago Essential hypertension   Primary Care at [facility], Gelene Mink, PA-C            pregabalin (LYRICA) 150 MG capsule 60 capsule 2    Sig: Take 1 capsule (150 mg total) by mouth 2 (two) times daily.     Not Delegated - Neurology:  Anticonvulsants - Controlled Failed - 05/25/2018 12:45 PM      Failed - This refill cannot be delegated      Passed - Valid encounter within last 12 months    Recent Outpatient Visits          1 month ago Essential hypertension   Primary Care at [facility], [place], PA-C   5 months ago Decreased GFR   [facility] at [facility], [place], PA-C   6 months ago Annual physical exam   Primary Care at [facility], Gelene Mink, PA-C   11 months ago Encounter for examination following treatment at hospital   Primary Care at [facility], Gelene Mink, PA-C   1 year ago Essential hypertension   Primary Care at [facility], Gelene Mink, [place]

## 2018-05-25 NOTE — Telephone Encounter (Signed)
[facility] and spoke to [place] about the [facility]. I advised he has 2 medications with refills and asked if he could pull those medications over from [facility]. He says the patient was advised [facility], because the [facility] with went out of business and all of his medication are tied up and will not be refilled by [facility]. I advised I will send this request to the the office.

## 2018-05-25 NOTE — Telephone Encounter (Signed)
Patient called, left VM to call back and schedule an establish care appointment with Dr. Mitchel Honour for the next 3 months in order to receive refills on medications.

## 2018-05-29 NOTE — Telephone Encounter (Unsigned)
Copied from [place] [phone]. Topic: Quick Communication - See Telephone Encounter >> May 29, 2018  9:13 AM Marja Kays F wrote: Pt is needing get a refill for oxycontin and pregabalin   [facility] [address]  Best number [phone]

## 2018-06-01 ENCOUNTER — Other Ambulatory Visit: Payer: Self-pay | Admitting: Physician Assistant

## 2018-06-01 DIAGNOSIS — M792 Neuralgia and neuritis, unspecified: Secondary | ICD-10-CM

## 2018-06-01 DIAGNOSIS — M542 Cervicalgia: Secondary | ICD-10-CM

## 2018-06-01 NOTE — Telephone Encounter (Signed)
Requested medication (s) are due for refill today: yes to both oxycodone and pregabalin  Requested medication (s) are on the active medication list: yes to both  Last refill:  Oxycodone: 04/21/18        Pregabalin: 09/16/17  Future visit scheduled: no  Notes to clinic:  Not delegated for NT to fill   Requested Prescriptions  Pending Prescriptions Disp Refills   oxyCODONE (OXYCONTIN) 10 mg 12 hr tablet 28 tablet 0    Sig: Take 1 tablet (10 mg total) by mouth every 12 (twelve) hours.     Not Delegated - Analgesics:  Opioid Agonists Failed - 06/01/2018  2:43 PM      Failed - This refill cannot be delegated      Failed - Urine Drug Screen completed in last 360 days.      Passed - Valid encounter within last 6 months    Recent Outpatient Visits          1 month ago Essential hypertension   Primary Care at [facility], [place], PA-C   5 months ago Decreased GFR   Primary Care at [facility], [place], PA-C   6 months ago Annual physical exam   Primary Care at [facility], [place], PA-C   11 months ago Encounter for examination following treatment at hospital   Primary Care at [facility], Gelene Mink, PA-C   1 year ago Essential hypertension   Primary Care at [facility], Gelene Mink, PA-C            pregabalin (LYRICA) 150 MG capsule 60 capsule 2    Sig: Take 1 capsule (150 mg total) by mouth 2 (two) times daily.     Not Delegated - Neurology:  Anticonvulsants - Controlled Failed - 06/01/2018  2:43 PM      Failed - This refill cannot be delegated      Passed - Valid encounter within last 12 months    Recent Outpatient Visits          1 month ago Essential hypertension   Primary Care at [facility], [place], PA-C   5 months ago Decreased GFR   [facility] at [facility], [place], PA-C   6 months ago Annual physical exam   Primary Care at [facility], Gelene Mink, PA-C   11 months ago Encounter for  examination following treatment at hospital   Primary Care at [facility], Gelene Mink, PA-C   1 year ago Essential hypertension   Primary Care at [facility], Gelene Mink, [place]

## 2018-06-01 NOTE — Telephone Encounter (Signed)
Copied from [place] [id number]. Topic: Quick Communication - See Telephone Encounter >> May 29, 2018  9:13 AM Marja Kays F wrote: Pt is needing get a refill for oxycontin and pregabalin   [facility] [address]  Best number [phone]

## 2018-06-03 ENCOUNTER — Other Ambulatory Visit: Payer: Self-pay | Admitting: Physician Assistant

## 2018-06-03 DIAGNOSIS — I160 Hypertensive urgency: Secondary | ICD-10-CM

## 2018-06-09 ENCOUNTER — Other Ambulatory Visit: Payer: Self-pay | Admitting: Physician Assistant

## 2018-06-09 DIAGNOSIS — M542 Cervicalgia: Secondary | ICD-10-CM

## 2018-06-09 DIAGNOSIS — M792 Neuralgia and neuritis, unspecified: Secondary | ICD-10-CM

## 2018-06-09 NOTE — Telephone Encounter (Signed)
Pt requesting refill of Oxycodone and Lyrica.  Most likely needs Coreg as well.  Has f/u 07/07/2018 to re-establish.    Oxycodone: LOV  04/21/2018 LRF 04/21/2018 LRF Amount #28 Recommended f/u : Not stated  Lyrica: Refilled April 2019.  Not referenced in OV.   #60 with two refills

## 2018-06-09 NOTE — Telephone Encounter (Signed)
Copied from [place] [phone]. Topic: Quick Communication - Rx Refill/Question >> Jun 09, 2018  3:11 PM Jackey Loge, Lenna Sciara wrote: Medication: oxyCODONE (OXYCONTIN) 10 mg 12 hr tablet, pregabalin (LYRICA) 150 MG capsule  Has the patient contacted their pharmacy? Yes.   (Agent: If no, request that the patient contact the pharmacy for the refill.) (Agent: If yes, when and what did the pharmacy advise?) Call [facility] for refill [id number] [address], [place] [phone] (Phone) [phone] (Fax)   Preferred Pharmacy (with phone number or street name):   Agent: Please be advised that RX refills may take up to 3 business days. We ask that you follow-up with your pharmacy.

## 2018-06-10 ENCOUNTER — Encounter: Payer: Self-pay | Admitting: Family Medicine

## 2018-06-10 MED ORDER — OXYCODONE HCL ER 10 MG PO T12A: 10.0000 mg | EXTENDED_RELEASE_TABLET | Freq: Two times a day (BID) | ORAL | 0 refills | Status: DC

## 2018-06-10 MED ORDER — PREGABALIN 150 MG PO CAPS: 150.0000 mg | ORAL_CAPSULE | Freq: Two times a day (BID) | ORAL | 2 refills | Status: DC

## 2018-06-12 ENCOUNTER — Other Ambulatory Visit: Payer: Self-pay | Admitting: Physician Assistant

## 2018-06-12 DIAGNOSIS — I10 Essential (primary) hypertension: Secondary | ICD-10-CM

## 2018-06-18 ENCOUNTER — Telehealth: Payer: Self-pay | Admitting: Hematology

## 2018-06-18 NOTE — Telephone Encounter (Signed)
rescheduled 1/6 appointments at patient request per 1/3 schedule message. Left message. Schedule mailed.

## 2018-06-19 ENCOUNTER — Inpatient Hospital Stay: Payer: BC Managed Care – PPO

## 2018-06-19 ENCOUNTER — Inpatient Hospital Stay: Payer: BC Managed Care – PPO | Admitting: Hematology

## 2018-07-05 ENCOUNTER — Other Ambulatory Visit: Payer: Self-pay | Admitting: Physician Assistant

## 2018-07-05 DIAGNOSIS — I10 Essential (primary) hypertension: Secondary | ICD-10-CM

## 2018-07-06 ENCOUNTER — Inpatient Hospital Stay: Payer: BC Managed Care – PPO | Admitting: Hematology

## 2018-07-06 ENCOUNTER — Inpatient Hospital Stay: Payer: BC Managed Care – PPO | Attending: Hematology

## 2018-07-07 ENCOUNTER — Ambulatory Visit: Payer: BC Managed Care – PPO | Admitting: Emergency Medicine

## 2018-07-07 ENCOUNTER — Other Ambulatory Visit: Payer: Self-pay

## 2018-07-07 ENCOUNTER — Encounter: Payer: Self-pay | Admitting: Emergency Medicine

## 2018-07-07 VITALS — BP 108/74 | HR 71 | Temp 98.9°F | Ht 67.0 in | Wt 248.0 lb

## 2018-07-07 DIAGNOSIS — M792 Neuralgia and neuritis, unspecified: Secondary | ICD-10-CM

## 2018-07-07 DIAGNOSIS — G894 Chronic pain syndrome: Secondary | ICD-10-CM

## 2018-07-07 DIAGNOSIS — I10 Essential (primary) hypertension: Secondary | ICD-10-CM | POA: Diagnosis not present

## 2018-07-07 DIAGNOSIS — R05 Cough: Secondary | ICD-10-CM

## 2018-07-07 DIAGNOSIS — R059 Cough, unspecified: Secondary | ICD-10-CM

## 2018-07-07 DIAGNOSIS — Z7689 Persons encountering health services in other specified circumstances: Secondary | ICD-10-CM

## 2018-07-07 DIAGNOSIS — E119 Type 2 diabetes mellitus without complications: Secondary | ICD-10-CM

## 2018-07-07 MED ORDER — BENZONATATE 200 MG PO CAPS: 200.0000 mg | ORAL_CAPSULE | Freq: Two times a day (BID) | ORAL | 0 refills | Status: DC | PRN

## 2018-07-07 MED ORDER — PROMETHAZINE-DM 6.25-15 MG/5ML PO SYRP: 5.0000 mL | ORAL_SOLUTION | Freq: Four times a day (QID) | ORAL | 0 refills | Status: DC | PRN

## 2018-07-07 NOTE — Patient Instructions (Addendum)
   If you have lab work done today you will be contacted with your lab results within the next 2 weeks.  If you have not heard from us then please contact us. The fastest way to get your results is to register for My Chart.   IF you received an x-ray today, you will receive an invoice from [facility]. Please contact [facility] at [phone] with questions or concerns regarding your invoice.   IF you received labwork today, you will receive an invoice from [facility]. Please contact [facility] at [phone] with questions or concerns regarding your invoice.   Our billing staff will not be able to assist you with questions regarding bills from these companies.  You will be contacted with the lab results as soon as they are available. The fastest way to get your results is to activate your My Chart account. Instructions are located on the last page of this paperwork. If you have not heard from us regarding the results in 2 weeks, please contact this office.       Hypertension Hypertension, commonly called high blood pressure, is when the force of blood pumping through the arteries is too strong. The arteries are the blood vessels that carry blood from the heart throughout the body. Hypertension forces the heart to work harder to pump blood and may cause arteries to become narrow or stiff. Having untreated or uncontrolled hypertension can cause heart attacks, strokes, kidney disease, and other problems. A blood pressure reading consists of a higher number over a lower number. Ideally, your blood pressure should be below 120/80. The first ("top") number is called the systolic pressure. It is a measure of the pressure in your arteries as your heart beats. The second ("bottom") number is called the diastolic pressure. It is a measure of the pressure in your arteries as the heart relaxes. What are the causes? The cause of this condition is not known. What increases the  risk? Some risk factors for high blood pressure are under your control. Others are not. Factors you can change  Smoking.  Having type 2 diabetes mellitus, high cholesterol, or both.  Not getting enough exercise or physical activity.  Being overweight.  Having too much fat, sugar, calories, or salt (sodium) in your diet.  Drinking too much alcohol. Factors that are difficult or impossible to change  Having chronic kidney disease.  Having a family history of high blood pressure.  Age. Risk increases with age.  Race. You may be at higher risk if you are African-American.  Gender. Men are at higher risk than women before age 45. After age 65, women are at higher risk than men.  Having obstructive sleep apnea.  Stress. What are the signs or symptoms? Extremely high blood pressure (hypertensive crisis) may cause:  Headache.  Anxiety.  Shortness of breath.  Nosebleed.  Nausea and vomiting.  Severe chest pain.  Jerky movements you cannot control (seizures). How is this diagnosed? This condition is diagnosed by measuring your blood pressure while you are seated, with your arm resting on a surface. The cuff of the blood pressure monitor will be placed directly against the skin of your upper arm at the level of your heart. It should be measured at least twice using the same arm. Certain conditions can cause a difference in blood pressure between your right and left arms. Certain factors can cause blood pressure readings to be lower or higher than normal (elevated) for a short period of time:    When your blood pressure is higher when you are in a health care provider's office than when you are at home, this is called white coat hypertension. Most people with this condition do not need medicines.  When your blood pressure is higher at home than when you are in a health care provider's office, this is called masked hypertension. Most people with this condition may need medicines  to control blood pressure. If you have a high blood pressure reading during one visit or you have normal blood pressure with other risk factors:  You may be asked to return on a different day to have your blood pressure checked again.  You may be asked to monitor your blood pressure at home for 1 week or longer. If you are diagnosed with hypertension, you may have other blood or imaging tests to help your health care provider understand your overall risk for other conditions. How is this treated? This condition is treated by making healthy lifestyle changes, such as eating healthy foods, exercising more, and reducing your alcohol intake. Your health care provider may prescribe medicine if lifestyle changes are not enough to get your blood pressure under control, and if:  Your systolic blood pressure is above 130.  Your diastolic blood pressure is above 80. Your personal target blood pressure may vary depending on your medical conditions, your age, and other factors. Follow these instructions at home: Eating and drinking   Eat a diet that is high in fiber and potassium, and low in sodium, added sugar, and fat. An example eating plan is called the DASH (Dietary Approaches to Stop Hypertension) diet. To eat this way: ? Eat plenty of fresh fruits and vegetables. Try to fill half of your plate at each meal with fruits and vegetables. ? Eat whole grains, such as whole wheat pasta, brown rice, or whole grain bread. Fill about one quarter of your plate with whole grains. ? Eat or drink low-fat dairy products, such as skim milk or low-fat yogurt. ? Avoid fatty cuts of meat, processed or cured meats, and poultry with skin. Fill about one quarter of your plate with lean proteins, such as fish, chicken without skin, beans, eggs, and tofu. ? Avoid premade and processed foods. These tend to be higher in sodium, added sugar, and fat.  Reduce your daily sodium intake. Most people with hypertension should  eat less than 1,500 mg of sodium a day.  Limit alcohol intake to no more than 1 drink a day for nonpregnant women and 2 drinks a day for men. One drink equals 12 oz of beer, 5 oz of wine, or 1 oz of hard liquor. Lifestyle   Work with your health care provider to maintain a healthy body weight or to lose weight. Ask what an ideal weight is for you.  Get at least 30 minutes of exercise that causes your heart to beat faster (aerobic exercise) most days of the week. Activities may include walking, swimming, or biking.  Include exercise to strengthen your muscles (resistance exercise), such as pilates or lifting weights, as part of your weekly exercise routine. Try to do these types of exercises for 30 minutes at least 3 days a week.  Do not use any products that contain nicotine or tobacco, such as cigarettes and e-cigarettes. If you need help quitting, ask your health care provider.  Monitor your blood pressure at home as told by your health care provider.  Keep all follow-up visits as told by your health care provider.   This is important. Medicines  Take over-the-counter and prescription medicines only as told by your health care provider. Follow directions carefully. Blood pressure medicines must be taken as prescribed.  Do not skip doses of blood pressure medicine. Doing this puts you at risk for problems and can make the medicine less effective.  Ask your health care provider about side effects or reactions to medicines that you should watch for. Contact a health care provider if:  You think you are having a reaction to a medicine you are taking.  You have headaches that keep coming back (recurring).  You feel dizzy.  You have swelling in your ankles.  You have trouble with your vision. Get help right away if:  You develop a severe headache or confusion.  You have unusual weakness or numbness.  You feel faint.  You have severe pain in your chest or abdomen.  You vomit  repeatedly.  You have trouble breathing. Summary  Hypertension is when the force of blood pumping through your arteries is too strong. If this condition is not controlled, it may put you at risk for serious complications.  Your personal target blood pressure may vary depending on your medical conditions, your age, and other factors. For most people, a normal blood pressure is less than 120/80.  Hypertension is treated with lifestyle changes, medicines, or a combination of both. Lifestyle changes include weight loss, eating a healthy, low-sodium diet, exercising more, and limiting alcohol. This information is not intended to replace advice given to you by your health care provider. Make sure you discuss any questions you have with your health care provider. Document Released: 05/31/2005 Document Revised: 04/28/2016 Document Reviewed: 04/28/2016 [employer] Interactive Patient Education  2019 [employer]  

## 2018-07-07 NOTE — Addendum Note (Signed)
Addended by: Davina Poke on: 07/07/2018 03:29 PM   Modules accepted: Orders

## 2018-07-07 NOTE — Progress Notes (Addendum)
Jeffrey Parks 68 y.o.   Chief Complaint  Patient presents with  . Transitions Of Care    from [facility]   . Cough    HISTORY OF PRESENT ILLNESS: This is a 68 y.o. male here to establish care with me.  Patient has a history of hypertension and chronic pain syndrome.  Takes OxyContin several times a day.  Has had some flulike symptoms and cough for the past getting better.  No other complaints or medical concerns.  HPI   Prior to Admission medications   Medication Sig Start Date End Date Taking? Authorizing Provider  acetaminophen (TYLENOL ARTHRITIS PAIN) 650 MG CR tablet Take 1 tablet (650 mg total) by mouth every 6 (six) hours as needed for pain. 06/09/16  Yes Plunkett, Loree Fee, MD  bisacodyl (DULCOLAX) 5 MG EC tablet Take 1 tablet (5 mg total) by mouth daily as needed for moderate constipation. 12/02/16  Yes McVey, Gelene Mink, PA-C  carvedilol (COREG) 25 MG tablet TAKE 1 TABLET(25 MG) BY MOUTH TWICE DAILY WITH A MEAL 06/13/18  Yes Marithza Malachi, Ines Bloomer, MD  furosemide (LASIX) 40 MG tablet TAKE 2 TABLETS(80 MG) BY MOUTH DAILY 06/05/18  Yes McVey, Gelene Mink, PA-C  hydrALAZINE (APRESOLINE) 25 MG tablet Take 1 tablet (25 mg total) by mouth 3 (three) times daily. 04/21/18  Yes McVey, Gelene Mink, PA-C  losartan-hydrochlorothiazide (HYZAAR) 100-25 MG tablet TAKE 1 TABLET BY MOUTH DAILY 07/05/18  Yes McVey, Gelene Mink, PA-C  metFORMIN (GLUCOPHAGE) 500 MG tablet Take 1 tablet (500 mg total) by mouth 2 (two) times daily with a meal. 11/21/17  Yes McVey, Gelene Mink, PA-C  oxyCODONE (OXYCONTIN) 10 mg 12 hr tablet Take 1 tablet (10 mg total) by mouth every 12 (twelve) hours. 06/10/18  Yes Stallings, Zoe A, MD  polyethylene glycol powder (GLYCOLAX/MIRALAX) powder Take 17 g by mouth 2 (two) times daily as needed. 05/03/16  Yes McVey, Gelene Mink, PA-C  pregabalin (LYRICA) 150 MG capsule Take 1 capsule (150 mg total) by mouth 2 (two) times daily. 06/10/18  Yes  Forrest Moron, MD  Sennosides-Docusate Sodium (SENNA-DOCUSATE SODIUM PO) Take by mouth.   Yes [provider]  oxyCODONE-acetaminophen (PERCOCET/ROXICET) 5-325 MG tablet Take by mouth every 4 (four) hours as needed for severe pain.    [provider]    Allergies  Allergen Reactions  . Norvasc [Amlodipine]     Patient Active Problem List   Diagnosis Date Noted  . Chronic kidney disease, stage III (moderate) ([place]) 02/08/2018  . 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 Oropharyngeal dysphagia 06/22/2017  . Type 2 diabetes mellitus with complication ([place]) 85/46/2703  . Gait instability 01/14/2017  . 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 Cervicalgia 01/14/2017  . Leg swelling 09/08/2016  . Sleep apnea 09/08/2016  . Dizziness 09/08/2016  . Fatigue 05/30/2015  . Hypogonadism male 05/22/2015  . Morbid obesity due to excess calories ([place]) 05/22/2015  . 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 Essential hypertension 05/07/2015  . Erectile dysfunction 05/07/2015    Past Medical History:  Diagnosis Date  . Hypertension   . Low testosterone   . Sickle cell trait [facility])     Past Surgical History:  Procedure Laterality Date  . neck surgery, Dec. 14,2018    . None      Social History   Socioeconomic History  . Marital status: Single    Spouse name: Not on file  . Number of children: Not on file  . Years of education: Not on file  .  Highest education level: Not on file  Occupational History  . Not on file  Social Needs  . Financial resource strain: Not on file  . Food insecurity:    Worry: Not on file    Inability: Not on file  . Transportation needs:    Medical: Not on file    Non-medical: Not on file  Tobacco Use  . Smoking status: Former Smoker    Types: Cigarettes  . Smokeless tobacco: Never Used  . Tobacco comment: Quit two years ago.  Light smoker prior.   Substance and Sexual Activity  . Alcohol use: Yes    Alcohol/week: 3.0  standard drinks    Types: 3 Standard drinks or equivalent per week  . Drug use: No  . Sexual activity: Not on file  Lifestyle  . Physical activity:    Days per week: Not on file    Minutes per session: Not on file  . Stress: Not on file  Relationships  . Social connections:    Talks on phone: Not on file    Gets together: Not on file    Attends religious service: Not on file    Active member of club or organization: Not on file    Attends meetings of clubs or organizations: Not on file    Relationship status: Not on file  . Intimate partner violence:    Fear of current or ex partner: Not on file    Emotionally abused: Not on file    Physically abused: Not on file    Forced sexual activity: Not on file  Other Topics Concern  . Not on file  Social History Narrative   Lives with wife, 2 adult children and 3 grandchildren   Has 2 deceased children - 4 children total   Has PhD in literature   Professor at [employer]    Family History  Problem Relation Age of Onset  . Hypertension Mother   . Diabetes Unknown   . Sickle cell anemia Son   . Sickle cell anemia Son      Review of Systems  Constitutional: Negative.  Negative for chills and fever.  Eyes: Negative for blurred vision and double vision.  Respiratory: Negative for shortness of breath.   Cardiovascular: Negative for chest pain and palpitations.  Gastrointestinal: Negative for nausea and vomiting.  Musculoskeletal: Positive for back pain.       Chronic extremity pain  Neurological: Negative for dizziness and headaches.  All other systems reviewed and are negative.   Vitals:   07/07/18 1508  BP: 108/74  Pulse: 71  Temp: 98.9 F (37.2 C)  SpO2: 93%    Physical Exam Vitals signs reviewed.  Constitutional:      Appearance: He is obese.  HENT:     Head: Normocephalic.  Eyes:     Extraocular Movements: Extraocular movements intact.  Neck:     Musculoskeletal: Normal range of motion.  Cardiovascular:      Rate and Rhythm: Normal rate.  Pulmonary:     Effort: Pulmonary effort is normal.  Skin:    General: Skin is warm and dry.  Neurological:     General: No focal deficit present.     Mental Status: He is alert and oriented to person, place, and time.  Psychiatric:        Mood and Affect: Mood normal.        Behavior: Behavior normal.      ASSESSMENT & PLAN: Jeffrey Parks was seen today for transitions  of care and cough.  Diagnoses and all orders for this visit:  Essential hypertension  Neuropathic pain  Type 2 diabetes mellitus without complication, without long-term current use of insulin (HCC)  Chronic pain syndrome -     Ambulatory referral to Pain Clinic  Encounter to establish care  Cough Comments: URI Orders: -     benzonatate (TESSALON) 200 MG capsule; Take 1 capsule (200 mg total) by mouth 2 (two) times daily as needed for cough. -     promethazine-dextromethorphan (PROMETHAZINE-DM) 6.25-15 MG/5ML syrup; Take 5 mLs by mouth 4 (four) times daily as needed for cough.     Patient Instructions       If you have lab work done today you will be contacted with your lab results within the next 2 weeks.  If you have not heard from [place] then please contact us. The fastest way to get your results is to register for My Chart.   IF you received an x-ray today, you will receive an invoice from [facility] Rad[facility]. Please contact [facility] Radiology at [phone] with questions or concerns regarding your invoice.   IF you received labwork today, you will receive an invoice from [place]. Please contact [facility] at [phone] with questions or concerns regarding your invoice.   Our billing staff will not be able to assist you with questions regarding bills from these companies.  You will be contacted with the lab results as soon as they are available. The fastest way to get your results is to activate your My Chart account. Instructions are located on the last page  of this paperwork. If you have not heard from [place] regarding the results in 2 weeks, please contact this office.      Hypertension Hypertension, commonly called high blood pressure, is when the force of blood pumping through the arteries is too strong. The arteries are the blood vessels that carry blood from the heart throughout the body. Hypertension forces the heart to work harder to pump blood and may cause arteries to become narrow or stiff. Having untreated or uncontrolled hypertension can cause heart attacks, strokes, kidney disease, and other problems. A blood pressure reading consists of a higher number over a lower number. Ideally, your blood pressure should be below 120/80. The first ("top") number is called the systolic pressure. It is a measure of the pressure in your arteries as your heart beats. The second ("bottom") number is called the diastolic pressure. It is a measure of the pressure in your arteries as the heart relaxes. What are the causes? The cause of this condition is not known. What increases the risk? Some risk factors for high blood pressure are under your control. Others are not. Factors you can change  Smoking.  Having type 2 diabetes mellitus, high cholesterol, or both.  Not getting enough exercise or physical activity.  Being overweight.  Having too much fat, sugar, calories, or salt (sodium) in your diet.  Drinking too much alcohol. Factors that are difficult or impossible to change  Having chronic kidney disease.  Having a family history of high blood pressure.  Age. Risk increases with age.  Race. You may be at higher risk if you are African-American.  Gender. Men are at higher risk than women before age 53. After age 67, women are at higher risk than men.  Having obstructive sleep apnea.  Stress. What are the signs or symptoms? Extremely high blood pressure (hypertensive crisis) may cause:  Headache.  Anxiety.  Shortness of  breath.  Nosebleed.  Nausea and vomiting.  Severe chest pain.  Jerky movements you cannot control (seizures). How is this diagnosed? This condition is diagnosed by measuring your blood pressure while you are seated, with your arm resting on a surface. The cuff of the blood pressure monitor will be placed directly against the skin of your upper arm at the level of your heart. It should be measured at least twice using the same arm. Certain conditions can cause a difference in blood pressure between your right and left arms. Certain factors can cause blood pressure readings to be lower or higher than normal (elevated) for a short period of time:  When your blood pressure is higher when you are in a health care provider's office than when you are at home, this is called white coat hypertension. Most people with this condition do not need medicines.  When your blood pressure is higher at home than when you are in a health care provider's office, this is called masked hypertension. Most people with this condition may need medicines to control blood pressure. If you have a high blood pressure reading during one visit or you have normal blood pressure with other risk factors:  You may be asked to return on a different day to have your blood pressure checked again.  You may be asked to monitor your blood pressure at home for 1 week or longer. If you are diagnosed with hypertension, you may have other blood or imaging tests to help your health care provider understand your overall risk for other conditions. How is this treated? This condition is treated by making healthy lifestyle changes, such as eating healthy foods, exercising more, and reducing your alcohol intake. Your health care provider may prescribe medicine if lifestyle changes are not enough to get your blood pressure under control, and if:  Your systolic blood pressure is above 130.  Your diastolic blood pressure is above 80. Your  personal target blood pressure may vary depending on your medical conditions, your age, and other factors. Follow these instructions at home: Eating and drinking   Eat a diet that is high in fiber and potassium, and low in sodium, added sugar, and fat. An example eating plan is called the DASH (Dietary Approaches to Stop Hypertension) diet. To eat this way: ? Eat plenty of fresh fruits and vegetables. Try to fill half of your plate at each meal with fruits and vegetables. ? Eat whole grains, such as whole wheat pasta, brown rice, or whole grain bread. Fill about one quarter of your plate with whole grains. ? Eat or drink low-fat dairy products, such as skim milk or low-fat yogurt. ? Avoid fatty cuts of meat, processed or cured meats, and poultry with skin. Fill about one quarter of your plate with lean proteins, such as fish, chicken without skin, beans, eggs, and tofu. ? Avoid premade and processed foods. These tend to be higher in sodium, added sugar, and fat.  Reduce your daily sodium intake. Most people with hypertension should eat less than 1,500 mg of sodium a day.  Limit alcohol intake to no more than 1 drink a day for nonpregnant women and 2 drinks a day for men. One drink equals 12 oz of beer, 5 oz of wine, or 1 oz of hard liquor. Lifestyle   Work with your health care provider to maintain a healthy body weight or to lose weight. Ask what an ideal weight is for you.  Get at least 30 minutes  of exercise that causes your heart to beat faster (aerobic exercise) most days of the week. Activities may include walking, swimming, or biking.  Include exercise to strengthen your muscles (resistance exercise), such as pilates or lifting weights, as part of your weekly exercise routine. Try to do these types of exercises for 30 minutes at least 3 days a week.  Do not use any products that contain nicotine or tobacco, such as cigarettes and e-cigarettes. If you need help quitting, ask your  health care provider.  Monitor your blood pressure at home as told by your health care provider.  Keep all follow-up visits as told by your health care provider. This is important. Medicines  Take over-the-counter and prescription medicines only as told by your health care provider. Follow directions carefully. Blood pressure medicines must be taken as prescribed.  Do not skip doses of blood pressure medicine. Doing this puts you at risk for problems and can make the medicine less effective.  Ask your health care provider about side effects or reactions to medicines that you should watch for. Contact a health care provider if:  You think you are having a reaction to a medicine you are taking.  You have headaches that keep coming back (recurring).  You feel dizzy.  You have swelling in your ankles.  You have trouble with your vision. Get help right away if:  You develop a severe headache or confusion.  You have unusual weakness or numbness.  You feel faint.  You have severe pain in your chest or abdomen.  You vomit repeatedly.  You have trouble breathing. Summary  Hypertension is when the force of blood pumping through your arteries is too strong. If this condition is not controlled, it may put you at risk for serious complications.  Your personal target blood pressure may vary depending on your medical conditions, your age, and other factors. For most people, a normal blood pressure is less than 120/80.  Hypertension is treated with lifestyle changes, medicines, or a combination of both. Lifestyle changes include weight loss, eating a healthy, low-sodium diet, exercising more, and limiting alcohol. This information is not intended to replace advice given to you by your health care provider. Make sure you discuss any questions you have with your health care provider. Document Released: 05/31/2005 Document Revised: 04/28/2016 Document Reviewed: 04/28/2016 Elsevier  Interactive Patient Education  2019 Elsevier Inc.      Agustina Caroli, MD [facility]

## 2018-09-22 ENCOUNTER — Other Ambulatory Visit: Payer: Self-pay | Admitting: Physician Assistant

## 2018-09-22 DIAGNOSIS — I10 Essential (primary) hypertension: Secondary | ICD-10-CM

## 2018-09-22 DIAGNOSIS — M792 Neuralgia and neuritis, unspecified: Secondary | ICD-10-CM

## 2018-09-22 NOTE — Telephone Encounter (Signed)
Requested medication (s) are due for refill today: yes  Requested medication (s) are on the active medication list: yes  Last refill:  06/10/18  Future visit scheduled:no Notes to clinic:   medication not delegated to NT to refill   Requested Prescriptions  Pending Prescriptions Disp Refills   pregabalin (LYRICA) 150 MG capsule [Pharmacy Med Name: PREGABALIN 150MG  CAPSULES] 60 capsule     Sig: TAKE 1 CAPSULE(150 MG) BY MOUTH TWICE DAILY     Not Delegated - Neurology:  Anticonvulsants - Controlled Failed - 09/22/2018  9:53 AM      Failed - This refill cannot be delegated      Passed - Valid encounter within last 12 months    Recent Outpatient Visits          2 months ago Essential hypertension   [facility] at [place], [place], MD   5 months ago Essential hypertension   Primary Care at [facility], [place], [place]   9 months ago Decreased GFR   Primary Care at [place], [place] D, PA-C   10 months ago Annual physical exam   Primary Care at [facility], Gelene Mink, PA-C   1 year ago Encounter for examination following treatment at hospital   Primary Care at [facility], Gelene Mink, [place]           Signed Prescriptions Disp Refills   losartan-hydrochlorothiazide (HYZAAR) 100-25 MG tablet 90 tablet 1    Sig: TAKE 1 TABLET BY MOUTH DAILY     Cardiovascular: ARB + Diuretic Combos Failed - 09/22/2018  9:53 AM      Failed - K in normal range and within 180 days    Potassium  Date Value Ref Range Status  02/15/2018 3.9 3.5 - 5.1 mmol/L Final         Failed - Na in normal range and within 180 days    Sodium  Date Value Ref Range Status  02/15/2018 143 135 - 145 mmol/L Final  12/12/2017 147 (H) 134 - 144 mmol/L Final         Failed - Cr in normal range and within 180 days    Creatinine  Date Value Ref Range Status  02/15/2018 1.80 (H) 0.61 - 1.24 mg/dL Final   Creat  Date Value Ref Range Status  05/03/2016 1.11 0.70 - 1.25  mg/dL Final    Comment:      For patients > or = 67 years of age: The upper reference limit for Creatinine is approximately 13% higher for people identified as African-American.            Failed - Ca in normal range and within 180 days    Calcium  Date Value Ref Range Status  02/15/2018 9.6 8.9 - 10.3 mg/dL Final         Passed - Patient is not pregnant      Passed - Last BP in normal range    BP Readings from Last 1 Encounters:  07/07/18 108/74         Passed - Valid encounter within last 6 months    Recent Outpatient Visits          2 months ago Essential hypertension   [facility] at [place], Ines Bloomer, MD   5 months ago Essential hypertension   Primary Care at [facility], [place], [place]   9 months ago Decreased GFR   Primary Care at [facility], [place] D, PA-C   10  months ago Annual physical exam   Primary Care at [facility], Gelene Mink, PA-C   1 year ago Encounter for examination following treatment at hospital   Primary Care at [facility], Gelene Mink, [place]

## 2018-10-06 ENCOUNTER — Other Ambulatory Visit: Payer: Self-pay | Admitting: Physician Assistant

## 2018-10-06 DIAGNOSIS — I160 Hypertensive urgency: Secondary | ICD-10-CM

## 2018-10-06 NOTE — Telephone Encounter (Signed)
Requested medication (s) are due for refill today: yes  Requested medication (s) are on the active medication list: yes  Last refill:  06/05/18  Future visit scheduled: no  Notes to clinic:  Diuretics loop failed   Requested Prescriptions  Pending Prescriptions Disp Refills   furosemide (LASIX) 40 MG tablet [Pharmacy Med Name: FUROSEMIDE 40MG  TABLETS] 90 tablet 0    Sig: TAKE 2 TABLETS(80 MG) BY MOUTH DAILY     Cardiovascular:  Diuretics - Loop Failed - 10/06/2018  5:22 PM      Failed - Cr in normal range and within 360 days    Creatinine  Date Value Ref Range Status  02/15/2018 1.80 (H) 0.61 - 1.24 mg/dL Final   Creat  Date Value Ref Range Status  05/03/2016 1.11 0.70 - 1.25 mg/dL Final    Comment:      For patients > or = 67 years of age: The upper reference limit for Creatinine is approximately 13% higher for people identified as African-American.            Passed - K in normal range and within 360 days    Potassium  Date Value Ref Range Status  02/15/2018 3.9 3.5 - 5.1 mmol/L Final         Passed - Ca in normal range and within 360 days    Calcium  Date Value Ref Range Status  02/15/2018 9.6 8.9 - 10.3 mg/dL Final         Passed - Na in normal range and within 360 days    Sodium  Date Value Ref Range Status  02/15/2018 143 135 - 145 mmol/L Final  12/12/2017 147 (H) 134 - 144 mmol/L Final         Passed - Last BP in normal range    BP Readings from Last 1 Encounters:  07/07/18 108/74         Passed - Valid encounter within last 6 months    Recent Outpatient Visits          3 months ago Essential hypertension   Primary [facility] at [place], Ines Bloomer, MD   5 months ago Essential hypertension   Primary Care at [facility], [place], [place]   9 months ago Decreased GFR   [facility], [place] D, PA-C   10 months ago Annual physical exam   Primary Care at [facility], Gelene Mink, PA-C   1 year ago Encounter  for examination following treatment at hospital   Primary Care at [facility], Gelene Mink, [place]

## 2019-01-01 ENCOUNTER — Other Ambulatory Visit: Payer: Self-pay

## 2019-01-01 ENCOUNTER — Ambulatory Visit: Payer: BC Managed Care – PPO | Admitting: Family Medicine

## 2019-01-01 ENCOUNTER — Encounter: Payer: Self-pay | Admitting: Family Medicine

## 2019-01-01 VITALS — BP 117/74 | HR 80 | Temp 98.6°F | Ht 67.0 in | Wt 259.2 lb

## 2019-01-01 DIAGNOSIS — I10 Essential (primary) hypertension: Secondary | ICD-10-CM

## 2019-01-01 DIAGNOSIS — D472 Monoclonal gammopathy: Secondary | ICD-10-CM | POA: Diagnosis not present

## 2019-01-01 DIAGNOSIS — E1122 Type 2 diabetes mellitus with diabetic chronic kidney disease: Secondary | ICD-10-CM

## 2019-01-01 DIAGNOSIS — G894 Chronic pain syndrome: Secondary | ICD-10-CM

## 2019-01-01 DIAGNOSIS — I160 Hypertensive urgency: Secondary | ICD-10-CM

## 2019-01-01 DIAGNOSIS — M792 Neuralgia and neuritis, unspecified: Secondary | ICD-10-CM

## 2019-01-01 MED ORDER — LOSARTAN POTASSIUM-HCTZ 100-25 MG PO TABS: 1.0000 | ORAL_TABLET | Freq: Every day | ORAL | 1 refills | Status: DC

## 2019-01-01 MED ORDER — FUROSEMIDE 40 MG PO TABS: ORAL_TABLET | ORAL | 1 refills | Status: DC

## 2019-01-01 MED ORDER — CARVEDILOL 25 MG PO TABS: ORAL_TABLET | ORAL | 1 refills | Status: DC

## 2019-01-01 MED ORDER — PREGABALIN 150 MG PO CAPS: ORAL_CAPSULE | ORAL | 2 refills | Status: DC

## 2019-01-01 MED ORDER — HYDRALAZINE HCL 25 MG PO TABS: 25.0000 mg | ORAL_TABLET | Freq: Three times a day (TID) | ORAL | 1 refills | Status: DC

## 2019-01-01 NOTE — Patient Instructions (Addendum)
  Scheduled eye doctor visit - let them know you have diabetes so they will send me a report.   Schedule appointment with Dr. Joelyn Oms (nephrologist). [phone] I did recommend use of compression stockings, but please discuss the swelling in the legs at your next visit with nephrology.  Continue same medications for now unless any worsening symptoms.  Please follow-up in 3 months.  I can refill the Lyrica, but if oxycontin needed, I can refer you to pain management. Let me know.  High dose Lyrica you are taking may also be worsening leg swelling, so would recommend lower dose if pain is improving. Let me know.   I would recommend evaluation for sleep apnea, as if that is still present can affect heart health, and blood pressure. If you decide to stop CPAP, let me know and I can refer you for updated testing to make sure you do not still have sleep apnea.   Nice meeting you today, please follow up in 3 months.   If you have lab work done today you will be contacted with your lab results within the next 2 weeks.  If you have not heard from [place] then please contact us. The fastest way to get your results is to register for My Chart.   IF you received an x-ray today, you will receive an invoice from [facility] Radiology. Please contact [facility] Radiology at [phone] with questions or concerns regarding your invoice.   IF you received labwork today, you will receive an invoice from [place]. Please contact [facility] at [phone] with questions or concerns regarding your invoice.   Our billing staff will not be able to assist you with questions regarding bills from these companies.  You will be contacted with the lab results as soon as they are available. The fastest way to get your results is to activate your My Chart account. Instructions are located on the last page of this paperwork. If you have not heard from [place] regarding the results in 2 weeks, please contact this office.

## 2019-01-01 NOTE — Progress Notes (Signed)
Subjective:    Patient ID: Jeffrey Parks, male    DOB: 1950-07-09, 68 y.o.   MRN: [id number]  HPI Jeffrey Parks is a 68 y.o. male Presents today for: Chief Complaint  Patient presents with   Transitions Of Care   New patient to me here for transition of care.  Was most recently seen in January by Dr. Mitchel Honour, also for possible transition of care.  History of multiple medical problems including type 2 diabetes, chronic kidney disease, hypertension, chronic pain disorder.   Chronic pain Referred to pain management at January visit. He did not call follow up. Treated previously with oxycodone, OxyContin, Lyrica.  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  Followed by [facility], last appointment January 2. Only taking oxycontin once every 3 weeks.  Controlled substance database (PDMP) reviewed. No concerns appreciated. Last filled oxycontin ER 10mg , #28 on 06/10/18. lyrica filled 11/20/18. Has not needed oxycontin in 1 month. lyrica has helped with jumping sensation in legs.  Denies overuse or addiction.   Hypertension: BP Readings from Last 3 Encounters:  01/01/19 117/74  07/07/18 108/74  04/21/18 122/76   Lab Results  Component Value Date   CREATININE 1.80 (H) 02/15/2018  Carvedilol 25 mg twice daily, Lasix 40 mg 2/day, hydralazine 25 mg 3 times daily, losartan HCT 100/25 mg daily. Swelling controlled with lasix.  Walking for exercise - 3-4 days per week.  Echo 09/22/16: NL EF - 60-65%, normal wall motion.  Over the counter potassium supplement daily.  Has compression stockings but not typically using   History obstructive sleep apnea. Stopped using CPAP, less snoring recently. Has not used in past few months. Plans on restarting CPAP.    IgM monoclonal gammopathy of uncertain significance, followed by [facility], Dr. Irene Limbo.   Diabetes: Complicated by chronic kidney disease.  Urologist Dr. Joelyn Oms at [facility], last note  reviewed from December 2019, plan for 73-month follow-up. Metformin 500 mg twice daily Rx - only takes one every other day. Home readings checked by wife - 47's - 110. No new side effects.   Lyrica 150 mg daily. Microalbumin: will check today.  Optho, foot exam, pneumovax: Due for optho visit - he will schedule.  Declines pneumovax.  Foot exam in January.   There is no immunization history on file for this patient.   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/22/2017   Oropharyngeal dysphagia 06/22/2017   Type 2 diabetes mellitus with complication ([place]) 71/11/2692   Gait instability 01/14/2017   Hyperreflexia 01/14/2017   Cervicalgia 01/14/2017   Leg swelling 09/08/2016   Sleep apnea 09/08/2016   Dizziness 09/08/2016   Fatigue 05/30/2015   Hypogonadism male 05/22/2015   Morbid obesity due to excess calories ([place]) 05/22/2015   Testosterone insufficiency 05/22/2015   Essential hypertension 05/07/2015   Erectile dysfunction 05/07/2015   Past Medical History:  Diagnosis Date   Hypertension    Low testosterone    Sickle cell trait [facility])    Past Surgical History:  Procedure Laterality Date   neck surgery, Dec. 14,2018  None     Allergies  Allergen Reactions   Norvasc [Amlodipine]    Prior to Admission medications   Medication Sig Start Date End Date Taking? Authorizing Provider  acetaminophen (TYLENOL ARTHRITIS PAIN) 650 MG CR tablet Take 1 tablet (650 mg total) by mouth every 6 (six) hours as needed for pain. 06/09/16   Blanchie Dessert, MD  benzonatate (TESSALON) 200 MG capsule Take 1 capsule (200 mg  total) by mouth 2 (two) times daily as needed for cough. 07/07/18   Horald Pollen, MD  bisacodyl (DULCOLAX) 5 MG EC tablet Take 1 tablet (5 mg total) by mouth daily as needed for moderate constipation. 12/02/16   McVey, Gelene Mink, PA-C  carvedilol (COREG) 25 MG tablet TAKE 1 TABLET(25 MG) BY MOUTH TWICE DAILY WITH A MEAL 06/13/18   Horald Pollen, MD  furosemide (LASIX) 40 MG tablet TAKE 2 TABLETS(80 MG) BY MOUTH DAILY 10/10/18   Horald Pollen, MD  hydrALAZINE (APRESOLINE) 25 MG tablet Take 1 tablet (25 mg total) by mouth 3 (three) times daily. 04/21/18   McVey, Gelene Mink, PA-C  losartan-hydrochlorothiazide (HYZAAR) 100-25 MG tablet TAKE 1 TABLET BY MOUTH DAILY 09/22/18   Forrest Moron, MD  metFORMIN (GLUCOPHAGE) 500 MG tablet Take 1 tablet (500 mg total) by mouth 2 (two) times daily with a meal. 11/21/17   McVey, Gelene Mink, PA-C  oxyCODONE (OXYCONTIN) 10 mg 12 hr tablet Take 1 tablet (10 mg total) by mouth every 12 (twelve) hours. 06/10/18   Forrest Moron, MD  oxyCODONE-acetaminophen (PERCOCET/ROXICET) 5-325 MG tablet Take by mouth every 4 (four) hours as needed for severe pain.    [provider]  polyethylene glycol powder (GLYCOLAX/MIRALAX) powder Take 17 g by mouth 2 (two) times daily as needed. 05/03/16   McVey, Gelene Mink, PA-C  pregabalin (LYRICA) 150 MG capsule TAKE 1 CAPSULE(150 MG) BY MOUTH TWICE DAILY 09/22/18   Horald Pollen, MD  promethazine-dextromethorphan (PROMETHAZINE-DM) 6.25-15 MG/5ML syrup Take 5 mLs by mouth 4 (four) times daily as needed for cough. 07/07/18   Horald Pollen, MD  Sennosides-Docusate Sodium (SENNA-DOCUSATE SODIUM PO) Take by mouth.    [provider]   Social History   Socioeconomic History   Marital status: Single    Spouse name: Not on file   Number of children: Not on file   Years of education: Not on file   Highest education level: Not on file  Occupational History     Not on file  Social Needs   Financial resource strain: Not on file   Food insecurity    Worry: Not on file    Inability: Not on file   Transportation needs    Medical: Not on file    Non-medical: Not on file  Tobacco Use   Smoking status: Former Smoker    Types: Cigarettes   Smokeless tobacco: Never Used   Tobacco comment: Quit two years ago.  Light smoker prior.   Substance and Sexual Activity   Alcohol use: Yes    Alcohol/week: 3.0 standard drinks    Types: 3 Standard drinks or equivalent per week   Drug use: No   Sexual activity: Not on file  Lifestyle   Physical activity    Days per week: Not on file    Minutes per session: Not on file   Stress: Not on file  Relationships   Social connections    Talks on phone: Not on file    Gets together: Not on  file    Attends religious service: Not on file    Active member of club or organization: Not on file    Attends meetings of clubs or organizations: Not on file    Relationship status: Not on file   Intimate partner violence    Fear of current or ex partner: Not on file    Emotionally abused: Not on file    Physically abused: Not on file    Forced sexual activity: Not on file  Other Topics Concern   Not on file  Social History Narrative   Lives with wife, 2 adult children and 3 grandchildren   Has 2 deceased children - 4 children total   Has PhD in literature   Professor at [employer]    Review of Systems  Constitutional: Negative for fatigue and unexpected weight change.  Eyes: Negative for visual disturbance.  Respiratory: Negative for cough, chest tightness and shortness of breath.   Cardiovascular: Negative for chest pain, palpitations and leg swelling.  Gastrointestinal: Negative for abdominal pain and blood in stool.  Neurological: Negative for dizziness, light-headedness and headaches.       Objective:   Physical Exam Vitals signs reviewed.  Constitutional:      Appearance: He is  well-developed. He is obese. He is not ill-appearing.  HENT:     Head: Normocephalic and atraumatic.  Eyes:     Pupils: Pupils are equal, round, and reactive to light.  Neck:     Vascular: No carotid bruit or JVD.  Cardiovascular:     Rate and Rhythm: Normal rate and regular rhythm.     Heart sounds: Normal heart sounds. No murmur.  Pulmonary:     Effort: Pulmonary effort is normal.     Breath sounds: Normal breath sounds. No rales.  Musculoskeletal:     Right lower leg: Edema present.     Left lower leg: Edema (2+ pitting mid tibia, no wounds. skin intact. ) present.  Skin:    General: Skin is warm and dry.  Neurological:     Mental Status: He is alert and oriented to person, place, and time.    Vitals:   01/01/19 1418  BP: 117/74  Pulse: 80  Temp: 98.6 F (37 C)  TempSrc: Oral  SpO2: 99%  Weight: 259 lb 3.2 oz (117.6 kg)  Height: 5\' 7"  (1.702 m)  Body mass index is 40.6 kg/m.      Assessment & Plan:   Jeffrey Parks is a 68 y.o. male Type 2 diabetes mellitus with chronic kidney disease, without long-term current use of insulin, unspecified CKD stage ([place]) - Plan: Hemoglobin A1c, Microalbumin / creatinine urine ratio, Lipid panel  -Intermittent dosing of metformin, with home readings reportedly sound near normal.  Check A1c then can decide on med changes if needed.  Check urine microalbumin.  Recommended follow-up with nephrologist.  Essential hypertension - Plan: Comprehensive metabolic panel, Lipid panel, carvedilol (COREG) 25 MG tablet, losartan-hydrochlorothiazide (HYZAAR) 100-25 MG tablet, hydrALAZINE (APRESOLINE) 25 MG tablet  -  Stable, tolerating current regimen. Medications refilled. Labs pending as above.  Nephrology follow-up discussed  -Potential ramifications of untreated obstructive sleep apnea were discussed.  He plans to restart CPAP, or if he feels that he no longer has OSA, would recommend repeat testing to verify.  Chronic pain  disorder Neuropathic pain - Plan: pregabalin (LYRICA) 150 MG capsule  -Has not needed oxycodone/OxyContin recently.  Stable on Lyrica at this time.  However I did discuss the peripheral  edema may be related in part due to high dose of Lyrica and would recommend trial of 300 mg as that may be just as effective without the increased risk of adverse effects.  If he does require restarting oxycodone, would recommend follow-up with pain management and can place referral if needed.  IgM monoclonal gammopathy of uncertain significance  -Follow-up with hematology  Asymptomatic hypertensive urgency - Plan: furosemide (LASIX) 40 MG tablet  -Stable, hypertensive management as above.  Discussed peripheral edema with nephrology, as well as potential Lyrica affect as above.  Lungs clear, no signs of CHF at this time, prior echo reassuring EF   Meds ordered this encounter  Medications   carvedilol (COREG) 25 MG tablet    Sig: TAKE 1 TABLET(25 MG) BY MOUTH TWICE DAILY WITH A MEAL    Dispense:  180 tablet    Refill:  1   furosemide (LASIX) 40 MG tablet    Sig: TAKE 2 TABLETS(80 MG) BY MOUTH DAILY    Dispense:  180 tablet    Refill:  1   pregabalin (LYRICA) 150 MG capsule    Sig: TAKE 1 CAPSULE(150 MG) BY MOUTH TWICE DAILY    Dispense:  60 capsule    Refill:  2   losartan-hydrochlorothiazide (HYZAAR) 100-25 MG tablet    Sig: Take 1 tablet by mouth daily.    Dispense:  90 tablet    Refill:  1   hydrALAZINE (APRESOLINE) 25 MG tablet    Sig: Take 1 tablet (25 mg total) by mouth 3 (three) times daily.    Dispense:  270 tablet    Refill:  1   Patient Instructions    Scheduled eye doctor visit - let them know you have diabetes so they will send me a report.   Schedule appointment with Dr. Joelyn Oms (nephrologist). [phone] I did recommend use of compression stockings, but please discuss the swelling in the legs at your next visit with nephrology.  Continue same medications for now unless any  worsening symptoms.  Please follow-up in 3 months.  I can refill the Lyrica, but if oxycontin needed, I can refer you to pain management. Let me know.  High dose Lyrica you are taking may also be worsening leg swelling, so would recommend lower dose if pain is improving. Let me know.   I would recommend evaluation for sleep apnea, as if that is still present can affect heart health, and blood pressure. If you decide to stop CPAP, let me know and I can refer you for updated testing to make sure you do not still have sleep apnea.   Nice meeting you today, please follow up in 3 months.   If you have lab work done today you will be contacted with your lab results within the next 2 weeks.  If you have not heard from [place] then please contact us. The fastest way to get your results is to register for My Chart.   IF you received an x-ray today, you will receive an invoice from [facility]. Please contact [facility] at [phone] with questions or concerns regarding your invoice.   IF you received labwork today, you will receive an invoice from [facility]. Please contact [facility] at [phone] with questions or concerns regarding your invoice.   Our billing staff will not be able to assist you with questions regarding bills from these companies.  You will be contacted with the lab results as soon as they are available. The fastest way to get your  results is to activate your My Chart account. Instructions are located on the last page of this paperwork. If you have not heard from [place] regarding the results in 2 weeks, please contact this office.       Signed,   Merri Ray, MD [facility].  01/02/19 9:35 AM

## 2019-01-02 LAB — LIPID PANEL
Chol/HDL Ratio: 4 ratio (ref 0.0–5.0)
Cholesterol, Total: 153 mg/dL (ref 100–199)
HDL: 38 mg/dL — ABNORMAL LOW (ref >39)
LDL Calculated: 81 mg/dL (ref 0–99)
Triglycerides: 171 mg/dL — ABNORMAL HIGH (ref 0–149)
VLDL Cholesterol Cal: 34 mg/dL (ref 5–40)

## 2019-01-02 LAB — COMPREHENSIVE METABOLIC PANEL
ALT: 17 IU/L (ref 0–44)
AST: 19 IU/L (ref 0–40)
Albumin/Globulin Ratio: 1.9 (ref 1.2–2.2)
Albumin: 4.1 g/dL (ref 3.8–4.8)
Alkaline Phosphatase: 89 IU/L (ref 39–117)
BUN/Creatinine Ratio: 17 (ref 10–24)
BUN: 27 mg/dL (ref 8–27)
Bilirubin Total: 0.3 mg/dL (ref 0.0–1.2)
CO2: 25 mmol/L (ref 20–29)
Calcium: 9.2 mg/dL (ref 8.6–10.2)
Chloride: 101 mmol/L (ref 96–106)
Creatinine, Ser: 1.58 mg/dL — ABNORMAL HIGH (ref 0.76–1.27)
GFR calc Af Amer: 52 mL/min/{1.73_m2} — ABNORMAL LOW (ref >59)
GFR calc non Af Amer: 45 mL/min/{1.73_m2} — ABNORMAL LOW (ref >59)
Globulin, Total: 2.2 g/dL (ref 1.5–4.5)
Glucose: 175 mg/dL — ABNORMAL HIGH (ref 65–99)
Potassium: 3.5 mmol/L (ref 3.5–5.2)
Sodium: 142 mmol/L (ref 134–144)
Total Protein: 6.3 g/dL (ref 6.0–8.5)

## 2019-01-02 LAB — MICROALBUMIN / CREATININE URINE RATIO
Creatinine, Urine: 107.4 mg/dL
Microalb/Creat Ratio: 18 mg/g creat (ref 0–29)
Microalbumin, Urine: 19.2 ug/mL

## 2019-01-02 LAB — HEMOGLOBIN A1C
Est. average glucose Bld gHb Est-mCnc: 143 mg/dL
Hgb A1c MFr Bld: 6.6 % — ABNORMAL HIGH (ref 4.8–5.6)

## 2019-04-02 ENCOUNTER — Ambulatory Visit: Payer: BC Managed Care – PPO | Admitting: Family Medicine

## 2019-04-11 ENCOUNTER — Encounter: Payer: Self-pay | Admitting: Family Medicine

## 2019-04-11 ENCOUNTER — Other Ambulatory Visit: Payer: Self-pay

## 2019-04-11 ENCOUNTER — Ambulatory Visit: Payer: BC Managed Care – PPO | Admitting: Family Medicine

## 2019-04-11 VITALS — BP 138/80 | HR 77 | Temp 98.5°F | Wt 252.2 lb

## 2019-04-11 DIAGNOSIS — Z23 Encounter for immunization: Secondary | ICD-10-CM | POA: Diagnosis not present

## 2019-04-11 DIAGNOSIS — N529 Male erectile dysfunction, unspecified: Secondary | ICD-10-CM | POA: Diagnosis not present

## 2019-04-11 DIAGNOSIS — E1122 Type 2 diabetes mellitus with diabetic chronic kidney disease: Secondary | ICD-10-CM

## 2019-04-11 DIAGNOSIS — R609 Edema, unspecified: Secondary | ICD-10-CM

## 2019-04-11 DIAGNOSIS — N5089 Other specified disorders of the male genital organs: Secondary | ICD-10-CM | POA: Diagnosis not present

## 2019-04-11 DIAGNOSIS — R600 Localized edema: Secondary | ICD-10-CM

## 2019-04-11 NOTE — Progress Notes (Signed)
Subjective:    Patient ID: Jeffrey Parks, male    DOB: [birth year], 68 y.o.   MRN: [id number]  HPI Jeffrey Parks is a 68 y.o. male Presents today for: Chief Complaint  Patient presents with  . Medical Management of Chronic Issues    3 month f/u.   Marland Kitchen Erectile Dysfunction    the erectile is weak and scrutm is slightly enlarged   Initially seen July 20 with transition of care/new patient.  Diabetes: Overall stable in July with intermittent dosing of Metformin.  Prescribed 500 mg twice daily.  Associated with chronic kidney disease, plan to follow-up with nephrologist. appt 9/7: ckd3 stable. Better than baseline. No changes. recheck in 6 months.  Microalbumin: Normal July 20 Optho, foot exam, pneumovax: Due for Pneumovax, flu vaccine refuses - agrees after discussion.   Lab Results  Component Value Date   HGBA1C 6.6 (H) 01/01/2019   HGBA1C 6.5 (A) 04/21/2018   HGBA1C 7.4 (A) 11/21/2017   Lab Results  Component Value Date   LDLCALC 81 01/01/2019   CREATININE 1.58 (H) 01/01/2019    Hypertension: BP Readings from Last 3 Encounters:  04/11/19 138/80  01/01/19 117/74  07/07/18 108/74   Lab Results  Component Value Date   CREATININE 1.58 (H) 01/01/2019  He was continued on the same dose of hydralazine, losartan HCTZ and carvedilol at last visit but stressed importance of restarting CPAP.  Peripheral edema: Discussed at July visit.  Continued on Lasix 80 mg daily but also discussed potential impact of Lyrica high-dose.  Recommend he try 300 mg total daily dose to limit risk of adverse effects. Echocardiogram in April 2018 with normal EF of 60 to 65% with normal wall motion.  He was not using compressive stockings when discussed in July.  Weight is down 7 pounds from July. Not using compression socks.   Chronic pain with cervical spine disease. Taking pregabalin 150mg  once per day.   Erectile dysfunction: Difficulty with onset of erection including morning  erections past few months. Slight erection, not enough for intercourse.  meds in past not helpful.   Additionally notes scrotum appears to be enlarged. Noticed swelling 2-3 weeks ago. No pain. Feels like same past 3 weeks. No pain, rash/itching. No treatments.  No new dyspnea, no chest pain.     Wt Readings from Last 3 Encounters:  04/11/19 252 lb 3.2 oz (114.4 kg)  01/01/19 259 lb 3.2 oz (117.6 kg)  07/07/18 248 lb (112.5 kg)     Patient Active Problem List   Diagnosis Date Noted  . Chronic kidney disease, stage III (moderate) 02/08/2018  . Stenosis of cervical spine with myelopathy ([place]) 06/22/2017  . Oropharyngeal dysphagia 06/22/2017  . Type 2 diabetes mellitus with complication ([place]) 0000000  . Gait instability 01/14/2017  . 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 Cervicalgia 01/14/2017  . Leg swelling 09/08/2016  . Sleep apnea 09/08/2016  . Dizziness 09/08/2016  . Fatigue 05/30/2015  . Hypogonadism male 05/22/2015  . Morbid obesity due to excess calories ([place]) 05/22/2015  . 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 Essential hypertension 05/07/2015  . Erectile dysfunction 05/07/2015   Past Medical History:  Diagnosis Date  . Hypertension   . Low testosterone   . Sickle cell trait [facility])    Past Surgical History:  Procedure Laterality Date  . neck surgery, Dec. 14,2018    . None     Allergies  Allergen Reactions  . Norvasc [Amlodipine]    Prior to Admission medications   Medication  Sig Start Date End Date Taking? Authorizing Provider  acetaminophen (TYLENOL ARTHRITIS PAIN) 650 MG CR tablet Take 1 tablet (650 mg total) by mouth every 6 (six) hours as needed for pain. 06/09/16  Yes Plunkett, Loree Fee, MD  benzonatate (TESSALON) 200 MG capsule Take 1 capsule (200 mg total) by mouth 2 (two) times daily as needed for cough. 07/07/18  Yes Sagardia, Ines Bloomer, MD  bisacodyl (DULCOLAX) 5 MG EC tablet Take 1 tablet (5 mg total) by mouth daily as needed for moderate  constipation. 12/02/16  Yes McVey, Gelene Mink, PA-C  carvedilol (COREG) 25 MG tablet TAKE 1 TABLET(25 MG) BY MOUTH TWICE DAILY WITH A MEAL 01/01/19  Yes Wendie Agreste, MD  furosemide (LASIX) 40 MG tablet TAKE 2 TABLETS(80 MG) BY MOUTH DAILY 01/01/19  Yes Wendie Agreste, MD  hydrALAZINE (APRESOLINE) 25 MG tablet Take 1 tablet (25 mg total) by mouth 3 (three) times daily. 01/01/19  Yes Wendie Agreste, MD  losartan-hydrochlorothiazide (HYZAAR) 100-25 MG tablet Take 1 tablet by mouth daily. 01/01/19  Yes Wendie Agreste, MD  metFORMIN (GLUCOPHAGE) 500 MG tablet Take 1 tablet (500 mg total) by mouth 2 (two) times daily with a meal. 11/21/17  Yes McVey, Gelene Mink, PA-C  oxyCODONE (OXYCONTIN) 10 mg 12 hr tablet Take 1 tablet (10 mg total) by mouth every 12 (twelve) hours. 06/10/18  Yes Forrest Moron, MD  oxyCODONE-acetaminophen (PERCOCET/ROXICET) 5-325 MG tablet Take by mouth every 4 (four) hours as needed for severe pain.   Yes [provider]  polyethylene glycol powder (GLYCOLAX/MIRALAX) powder Take 17 g by mouth 2 (two) times daily as needed. 05/03/16  Yes McVey, Gelene Mink, PA-C  pregabalin (LYRICA) 150 MG capsule TAKE 1 CAPSULE(150 MG) BY MOUTH TWICE DAILY 01/01/19  Yes Wendie Agreste, MD  promethazine-dextromethorphan (PROMETHAZINE-DM) 6.25-15 MG/5ML syrup Take 5 mLs by mouth 4 (four) times daily as needed for cough. 07/07/18  Yes Sagardia, Ines Bloomer, MD  Sennosides-Docusate Sodium (SENNA-DOCUSATE SODIUM PO) Take by mouth.   Yes [provider]   Social History   Socioeconomic History  . Marital status: Single    Spouse name: Not on file  . Number of children: Not on file  . Years of education: Not on file  . Highest education level: Not on file  Occupational History  . Not on file  Social Needs  . Financial resource strain: Not on file  . Food insecurity    Worry: Not on file    Inability: Not on file  . Transportation needs     Medical: Not on file    Non-medical: Not on file  Tobacco Use  . Smoking status: Former Smoker    Types: Cigarettes  . Smokeless tobacco: Never Used  . Tobacco comment: Quit two years ago.  Light smoker prior.   Substance and Sexual Activity  . Alcohol use: Yes    Alcohol/week: 3.0 standard drinks    Types: 3 Standard drinks or equivalent per week  . Drug use: No  . Sexual activity: Not on file  Lifestyle  . Physical activity    Days per week: Not on file    Minutes per session: Not on file  . Stress: Not on file  Relationships  . Social Herbalist on phone: Not on file    Gets together: Not on file    Attends religious service: Not on file    Active member of club or organization: Not on file  Attends meetings of clubs or organizations: Not on file    Relationship status: Not on file  . Intimate partner violence    Fear of current or ex partner: Not on file    Emotionally abused: Not on file    Physically abused: Not on file    Forced sexual activity: Not on file  Other Topics Concern  . Not on file  Social History Narrative   Lives with wife, 2 adult children and 3 grandchildren   Has 2 deceased children - 4 children total   Has PhD in literature   Professor at [employer]    Review of Systems  Constitutional: Negative for fatigue and unexpected weight change.  Eyes: Negative for visual disturbance.  Respiratory: Negative for cough, chest tightness and shortness of breath.   Cardiovascular: Positive for leg swelling. Negative for chest pain and palpitations.  Gastrointestinal: Negative for abdominal pain and blood in stool.  Neurological: Negative for dizziness, light-headedness and headaches.       Objective:   Physical Exam Vitals signs reviewed.  Constitutional:      Appearance: He is well-developed.  HENT:     Head: Normocephalic and atraumatic.  Eyes:     Pupils: Pupils are equal, round, and reactive to light.  Neck:     Vascular: No  carotid bruit or JVD.  Cardiovascular:     Rate and Rhythm: Normal rate and regular rhythm.     Heart sounds: Normal heart sounds. No murmur.  Pulmonary:     Effort: Pulmonary effort is normal.     Breath sounds: Normal breath sounds. No rales.  Genitourinary:    Scrotum/Testes:        Right: Swelling (approx 7cm vs 4-5 on left. ) present. Tenderness not present.  Musculoskeletal:     Right lower leg: Edema present.     Left lower leg: Edema (2+ to prox tibia bilat with lower stasis changes, no wounds. ) present.  Skin:    General: Skin is warm and dry.  Neurological:     Mental Status: He is alert and oriented to person, place, and time.    Vitals:   04/11/19 1436  BP: 138/80  Pulse: 77  Temp: 98.5 F (36.9 C)  TempSrc: Oral  SpO2: 99%  Weight: 252 lb 3.2 oz (114.4 kg)       Assessment & Plan:   Lige E Kwolek is a 68 y.o. male Type 2 diabetes mellitus with chronic kidney disease, without long-term current use of insulin, unspecified CKD stage ([place])  -Previously controlled, continue same regimen with repeat A1c in 3 months  Needs flu shot - Plan: Flu Vaccine QUAD High Dose(Fluad), CANCELED: Flu Vaccine QUAD 36+ mos IM  Need for prophylactic vaccination against Streptococcus pneumoniae (pneumococcus) - Plan: Pneumococcal polysaccharide vaccine 23-valent greater than or equal to 2yo subcutaneous/IM  Peripheral edema  -Encouraged to use compression stockings.  No sign of wounds at this time but stasis changes noted.  Erectile dysfunction, unspecified erectile dysfunction type  - has tried meds in the past without relief.  Refer to urology to discuss treatment options.  Enlarged testicle - Plan: Ambulatory referral to Urology, US Scrotum, [place] Art/Ven Flow Abd Pelv Doppler  -Right testicle significantly larger than left.  Nontender, epididymis nontender, less likely infectious.  Initially check ultrasound, including Doppler to rule out torsion but not acute.  Refer to  urology for further evaluation  No orders of the defined types were placed in this encounter.  Patient  Instructions    Based on last diabetes test, no changes in medicines for now.  Repeat lab test in 3 months.  For compression stockings for leg swelling.  Elevate legs when seated as possible.  I will refer you to urology to discuss erectile dysfunction as well as the enlarged right testicle.  Prior to that time we will order an ultrasound to evaluate that testicle further.  Return to the clinic or go to the nearest emergency room if any of your symptoms worsen or new symptoms occur.  Peripheral Edema  Peripheral edema is swelling that is caused by a buildup of fluid. Peripheral edema most often affects the lower legs, ankles, and feet. It can also develop in the arms, hands, and face. The area of the body that has peripheral edema will look swollen. It may also feel heavy or warm. Your clothes may start to feel tight. Pressing on the area may make a temporary dent in your skin. You may not be able to move your swollen arm or leg as much as usual. There are many causes of peripheral edema. It can happen because of a complication of other conditions such as congestive heart failure, kidney disease, or a problem with your blood circulation. It also can be a side effect of certain medicines or because of an infection. It often happens to women during pregnancy. Sometimes, the cause is not known. Follow these instructions at home: Managing pain, stiffness, and swelling   Raise (elevate) your legs while you are sitting or lying down.  Move around often to prevent stiffness and to lessen swelling.  Do not sit or stand for long periods of time.  Wear support stockings as told by your health care provider. Medicines  Take over-the-counter and prescription medicines only as told by your health care provider.  Your health care provider may prescribe medicine to help your body get rid of  excess water (diuretic). General instructions  Pay attention to any changes in your symptoms.  Follow instructions from your health care provider about limiting salt (sodium) in your diet. Sometimes, eating less salt may reduce swelling.  Moisturize skin daily to help prevent skin from cracking and draining.  Keep all follow-up visits as told by your health care provider. This is important. Contact a health care provider if you have:  A fever.  Edema that starts suddenly or is getting worse, especially if you are pregnant or have a medical condition.  Swelling in only one leg.  Increased swelling, redness, or pain in one or both of your legs.  Drainage or sores at the area where you have edema. Get help right away if you:  Develop shortness of breath, especially when you are lying down.  Have pain in your chest or abdomen.  Feel weak.  Feel faint. Summary  Peripheral edema is swelling that is caused by a buildup of fluid. Peripheral edema most often affects the lower legs, ankles, and feet.  Move around often to prevent stiffness and to lessen swelling. Do not sit or stand for long periods of time.  Pay attention to any changes in your symptoms.  Contact a health care provider if you have edema that starts suddenly or is getting worse, especially if you are pregnant or have a medical condition.  Get help right away if you develop shortness of breath, especially when lying down. This information is not intended to replace advice given to you by your health care provider. Make sure you discuss any  questions you have with your health care provider. Document Released: 07/08/2004 Document Revised: 02/22/2018 Document Reviewed: 02/22/2018 Elsevier Patient Education  [employer].    If you have lab work done today you will be contacted with your lab results within the next 2 weeks.  If you have not heard from [place] then please contact us. The fastest way to get your  results is to register for My Chart.   IF you received an x-ray today, you will receive an invoice from [facility]. Please contact [facility] at [phone] with questions or concerns regarding your invoice.   IF you received labwork today, you will receive an invoice from [place]. Please contact [facility] at [phone] with questions or concerns regarding your invoice.   Our billing staff will not be able to assist you with questions regarding bills from these companies.  You will be contacted with the lab results as soon as they are available. The fastest way to get your results is to activate your My Chart account. Instructions are located on the last page of this paperwork. If you have not heard from [place] regarding the results in 2 weeks, please contact this office.       Signed,   Merri Ray, MD [facility] [place].  04/11/19 7:19 PM

## 2019-04-11 NOTE — Patient Instructions (Addendum)
Based on last diabetes test, no changes in medicines for now.  Repeat lab test in 3 months.  For compression stockings for leg swelling.  Elevate legs when seated as possible.  I will refer you to urology to discuss erectile dysfunction as well as the enlarged right testicle.  Prior to that time we will order an ultrasound to evaluate that testicle further.  Return to the clinic or go to the nearest emergency room if any of your symptoms worsen or new symptoms occur.  Peripheral Edema  Peripheral edema is swelling that is caused by a buildup of fluid. Peripheral edema most often affects the lower legs, ankles, and feet. It can also develop in the arms, hands, and face. The area of the body that has peripheral edema will look swollen. It may also feel heavy or warm. Your clothes may start to feel tight. Pressing on the area may make a temporary dent in your skin. You may not be able to move your swollen arm or leg as much as usual. There are many causes of peripheral edema. It can happen because of a complication of other conditions such as congestive heart failure, kidney disease, or a problem with your blood circulation. It also can be a side effect of certain medicines or because of an infection. It often happens to women during pregnancy. Sometimes, the cause is not known. Follow these instructions at home: Managing pain, stiffness, and swelling   Raise (elevate) your legs while you are sitting or lying down.  Move around often to prevent stiffness and to lessen swelling.  Do not sit or stand for long periods of time.  Wear support stockings as told by your health care provider. Medicines  Take over-the-counter and prescription medicines only as told by your health care provider.  Your health care provider may prescribe medicine to help your body get rid of excess water (diuretic). General instructions  Pay attention to any changes in your symptoms.  Follow instructions from your  health care provider about limiting salt (sodium) in your diet. Sometimes, eating less salt may reduce swelling.  Moisturize skin daily to help prevent skin from cracking and draining.  Keep all follow-up visits as told by your health care provider. This is important. Contact a health care provider if you have:  A fever.  Edema that starts suddenly or is getting worse, especially if you are pregnant or have a medical condition.  Swelling in only one leg.  Increased swelling, redness, or pain in one or both of your legs.  Drainage or sores at the area where you have edema. Get help right away if you:  Develop shortness of breath, especially when you are lying down.  Have pain in your chest or abdomen.  Feel weak.  Feel faint. Summary  Peripheral edema is swelling that is caused by a buildup of fluid. Peripheral edema most often affects the lower legs, ankles, and feet.  Move around often to prevent stiffness and to lessen swelling. Do not sit or stand for long periods of time.  Pay attention to any changes in your symptoms.  Contact a health care provider if you have edema that starts suddenly or is getting worse, especially if you are pregnant or have a medical condition.  Get help right away if you develop shortness of breath, especially when lying down. This information is not intended to replace advice given to you by your health care provider. Make sure you discuss any questions you have with  your health care provider. Document Released: 07/08/2004 Document Revised: 02/22/2018 Document Reviewed: 02/22/2018 Elsevier Patient Education  [employer].    If you have lab work done today you will be contacted with your lab results within the next 2 weeks.  If you have not heard from [place] then please contact us. The fastest way to get your results is to register for My Chart.   IF you received an x-ray today, you will receive an invoice from [facility]. Please  contact [facility] at [phone] with questions or concerns regarding your invoice.   IF you received labwork today, you will receive an invoice from [place]. Please contact [facility] at [phone] with questions or concerns regarding your invoice.   Our billing staff will not be able to assist you with questions regarding bills from these companies.  You will be contacted with the lab results as soon as they are available. The fastest way to get your results is to activate your My Chart account. Instructions are located on the last page of this paperwork. If you have not heard from [place] regarding the results in 2 weeks, please contact this office.

## 2019-04-13 ENCOUNTER — Other Ambulatory Visit: Payer: Self-pay

## 2019-04-13 ENCOUNTER — Other Ambulatory Visit: Payer: Self-pay | Admitting: Family Medicine

## 2019-04-13 DIAGNOSIS — N5089 Other specified disorders of the male genital organs: Secondary | ICD-10-CM

## 2019-04-19 ENCOUNTER — Ambulatory Visit
Admission: RE | Admit: 2019-04-19 | Discharge: 2019-04-19 | Disposition: A | Discharge status: Home / Self Care | Payer: BC Managed Care – PPO | Source: Ambulatory Visit | Attending: Family Medicine | Admitting: Family Medicine

## 2019-04-19 ENCOUNTER — Other Ambulatory Visit: Payer: BC Managed Care – PPO

## 2019-04-19 DIAGNOSIS — N5089 Other specified disorders of the male genital organs: Secondary | ICD-10-CM

## 2019-04-26 ENCOUNTER — Telehealth: Payer: Self-pay | Admitting: Family Medicine

## 2019-04-26 NOTE — Telephone Encounter (Signed)
Referral Request - Has patient seen PCP for this complaint? Yes.   *If NO, is insurance requiring patient see PCP for this issue before PCP can refer them? Referral for which specialty: US Scrotum with Doppler Preferred provider/office: [facility] Reason for referral: order on file is old and needs to be changed to US Scrotum with Doppler.

## 2019-04-28 ENCOUNTER — Other Ambulatory Visit: Payer: Self-pay | Admitting: Family Medicine

## 2019-04-28 DIAGNOSIS — N433 Hydrocele, unspecified: Secondary | ICD-10-CM

## 2019-04-28 NOTE — Progress Notes (Signed)
Hydroceles, bilateral on ultrasound.  Referral to urology.

## 2019-05-03 ENCOUNTER — Other Ambulatory Visit: Payer: Self-pay | Admitting: Urology

## 2019-05-04 NOTE — Telephone Encounter (Signed)
Order has been completed at this time.

## 2019-05-22 ENCOUNTER — Ambulatory Visit (HOSPITAL_COMMUNITY)
Admission: RE | Admit: 2019-05-22 | Discharge: 2019-05-22 | Disposition: A | Discharge status: Home / Self Care | Payer: BC Managed Care – PPO | Source: Ambulatory Visit | Attending: Family Medicine | Admitting: Family Medicine

## 2019-05-22 ENCOUNTER — Other Ambulatory Visit: Payer: Self-pay

## 2019-05-22 DIAGNOSIS — N5089 Other specified disorders of the male genital organs: Secondary | ICD-10-CM

## 2019-05-29 ENCOUNTER — Inpatient Hospital Stay (HOSPITAL_COMMUNITY): Admission: RE | Admit: 2019-05-29 | Payer: BC Managed Care – PPO | Source: Ambulatory Visit

## 2019-06-01 ENCOUNTER — Ambulatory Visit (HOSPITAL_BASED_OUTPATIENT_CLINIC_OR_DEPARTMENT_OTHER): Admit: 2019-06-01 | Payer: BC Managed Care – PPO | Admitting: Urology

## 2019-06-01 SURGERY — HYDROCELECTOMY
Anesthesia: General | Laterality: Bilateral

## 2019-07-07 ENCOUNTER — Other Ambulatory Visit: Payer: Self-pay | Admitting: Family Medicine

## 2019-07-07 DIAGNOSIS — I160 Hypertensive urgency: Secondary | ICD-10-CM

## 2019-07-07 DIAGNOSIS — I10 Essential (primary) hypertension: Secondary | ICD-10-CM

## 2019-07-18 ENCOUNTER — Ambulatory Visit (INDEPENDENT_AMBULATORY_CARE_PROVIDER_SITE_OTHER): Payer: BC Managed Care – PPO

## 2019-07-18 ENCOUNTER — Other Ambulatory Visit: Payer: Self-pay | Admitting: Family Medicine

## 2019-07-18 ENCOUNTER — Ambulatory Visit: Payer: BC Managed Care – PPO | Admitting: Family Medicine

## 2019-07-18 ENCOUNTER — Other Ambulatory Visit: Payer: Self-pay

## 2019-07-18 ENCOUNTER — Encounter: Payer: Self-pay | Admitting: Family Medicine

## 2019-07-18 VITALS — BP 154/90 | HR 71 | Temp 98.2°F | Ht 67.0 in | Wt 264.0 lb

## 2019-07-18 DIAGNOSIS — M25569 Pain in unspecified knee: Secondary | ICD-10-CM

## 2019-07-18 DIAGNOSIS — M25561 Pain in right knee: Secondary | ICD-10-CM

## 2019-07-18 DIAGNOSIS — M25562 Pain in left knee: Secondary | ICD-10-CM

## 2019-07-18 DIAGNOSIS — E1122 Type 2 diabetes mellitus with diabetic chronic kidney disease: Secondary | ICD-10-CM

## 2019-07-18 DIAGNOSIS — M545 Low back pain, unspecified: Secondary | ICD-10-CM

## 2019-07-18 DIAGNOSIS — R609 Edema, unspecified: Secondary | ICD-10-CM

## 2019-07-18 DIAGNOSIS — I10 Essential (primary) hypertension: Secondary | ICD-10-CM

## 2019-07-18 MED ORDER — CYCLOBENZAPRINE HCL 5 MG PO TABS: 5.0000 mg | ORAL_TABLET | Freq: Every evening | ORAL | 0 refills | Status: DC | PRN

## 2019-07-18 MED ORDER — TRAMADOL HCL 50 MG PO TABS: 50.0000 mg | ORAL_TABLET | Freq: Three times a day (TID) | ORAL | 0 refills | Status: AC | PRN

## 2019-07-18 NOTE — Progress Notes (Signed)
Subjective:  Patient ID: Jeffrey Parks, male    DOB: 1950/11/14  Age: 69 y.o. MRN: [id number]  CC:  Chief Complaint  Patient presents with  . Follow-up    on enlarged scrotum. pt saw a specialist last month. pt stats ultra sound showed swelling was going down on its own and that surgery was no longer nessisary. pt stats is has continued it's swelling reduction.   . Pain    pt states he is having pain in both knees and lower back. pt states this pain started 2 weeks ago and pain comes and gos. pt states his pain level is 9/10.     HPI Josecarlos E Pense presents for   Concerns above and follow up on diabetes.   Bilateral hydrocele,  referred  to urology, Dr. Karsten Ro.  Planned surgery, now canceled with symptom improvement.swelling lessened.   Diabetes: Complicated by chronic kidney disease.  Chronic kidney disease stage III in September.  Stable per nephrology.  47-month follow-up Metformin 500 mg twice daily.  He is on ARB with losartan hydrochlorothiazide, not currently on statin. Home readings 110-112.  Taking metformin 500mg  BID.  No missed doses/new side effects.  Microalbumin: Normal in July 2020. Optho, foot exam, pneumovax: Due for foot exam today.  Up-to-date Diabetic Foot Exam - Simple   Simple Foot Form Visual Inspection See comments: Yes Sensation Testing Intact to touch and monofilament testing bilaterally: Yes Pulse Check Posterior Tibialis and Dorsalis pulse intact bilaterally: Yes Comments Pt has quite abit of swelling and peeling skin around the top of both feet     Lab Results  Component Value Date   HGBA1C 6.6 (H) 01/01/2019   HGBA1C 6.5 (A) 04/21/2018   HGBA1C 7.4 (A) 11/21/2017   Lab Results  Component Value Date   LDLCALC 81 01/01/2019   CREATININE 1.58 (H) 01/01/2019    History of C-spine stenosis with myelopathy,  takes gabapentin 150mg  BID  Followed by Dr.Karikari at [facility], previous anterior cervical corpectomy and fusion.    Chronic peripheral edema, treated with Lasix 80mg  qd. , compression stockings.  Potential impact of high-dose gabapentin discussed prior visits.  Has been on 1 lyrica per day.  Less leg swellingon 150mg  lyrica dose, wearing compreesion stockings. No wounds.  Pain in neck is minimal - much better, stable at 150mg  dosing of lyrica.    Low back pain: Started about 2 weeks ago - may have twisted. No fall.  No bowel or bladder incontinence, no saddle anesthesia, no lower extremity weakness. No leg radiation.   History of degenerative disc disease with narrowing L3-4, L4-5 as well as bilateral L4-5, L5-S1 facet arthritic changes on imaging in 2006.  Pain is stable past 2 weeks. Worse at night, some impact on sleep. No sleep meds.    Tx: none.   Bilateral knee pain: Started 2 weeks ago. With possible twisting as above. No specific injury known. No swelling/locking/giving way.  Tx: otc lidocaine topical - no relief. Tylenol  2 per day. No history of seizure.    Hypertension: No missed doses of meds.  Hx of OSA, using cpap nightly.  Home readings: 134/75 this morning.  BP Readings from Last 3 Encounters:  07/18/19 (!) 154/90  04/11/19 138/80  01/01/19 117/74   Lab Results  Component Value Date   CREATININE 1.58 (H) 01/01/2019    History Patient Active Problem List   Diagnosis Date Noted  . Chronic kidney disease, stage III (moderate) 02/08/2018  . Stenosis of  cervical spine with myelopathy ([place]) 06/22/2017  . Oropharyngeal dysphagia 06/22/2017  . Type 2 diabetes mellitus with complication ([place]) 0000000  . Gait instability 01/14/2017  . 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 Cervicalgia 01/14/2017  . Leg swelling 09/08/2016  . Sleep apnea 09/08/2016  . Dizziness 09/08/2016  . Fatigue 05/30/2015  . Hypogonadism male 05/22/2015  . Morbid obesity due to excess calories ([place]) 05/22/2015  . 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 Essential hypertension 05/07/2015  . Erectile  dysfunction 05/07/2015   Past Medical History:  Diagnosis Date  . Hypertension   . Low testosterone   . Sickle cell trait [facility])    Past Surgical History:  Procedure Laterality Date  . neck surgery, Dec. 14,2018    . None     Allergies  Allergen Reactions  . Norvasc [Amlodipine]    Prior to Admission medications   Medication Sig Start Date End Date Taking? Authorizing Provider  acetaminophen (TYLENOL ARTHRITIS PAIN) 650 MG CR tablet Take 1 tablet (650 mg total) by mouth every 6 (six) hours as needed for pain. 06/09/16  Yes Plunkett, Loree Fee, MD  benzonatate (TESSALON) 200 MG capsule Take 1 capsule (200 mg total) by mouth 2 (two) times daily as needed for cough. 07/07/18  Yes Sagardia, Ines Bloomer, MD  bisacodyl (DULCOLAX) 5 MG EC tablet Take 1 tablet (5 mg total) by mouth daily as needed for moderate constipation. 12/02/16  Yes McVey, Gelene Mink, PA-C  carvedilol (COREG) 25 MG tablet TAKE 1 TABLET(25 MG) BY MOUTH TWICE DAILY WITH A MEAL 07/07/19  Yes Wendie Agreste, MD  furosemide (LASIX) 40 MG tablet TAKE 2 TABLETS(80 MG) BY MOUTH DAILY 07/07/19  Yes Wendie Agreste, MD  hydrALAZINE (APRESOLINE) 25 MG tablet TAKE 1 TABLET BY MOUTH THREE TIMES A DAY 07/07/19  Yes Wendie Agreste, MD  losartan-hydrochlorothiazide [facility]) 100-25 MG tablet TAKE 1 TABLET BY MOUTH EVERY DAY 07/07/19  Yes Wendie Agreste, MD  metFORMIN (GLUCOPHAGE) 500 MG tablet Take 1 tablet (500 mg total) by mouth 2 (two) times daily with a meal. 11/21/17  Yes McVey, Gelene Mink, PA-C  oxyCODONE (OXYCONTIN) 10 mg 12 hr tablet Take 1 tablet (10 mg total) by mouth every 12 (twelve) hours. 06/10/18  Yes Forrest Moron, MD  oxyCODONE-acetaminophen (PERCOCET/ROXICET) 5-325 MG tablet Take by mouth every 4 (four) hours as needed for severe pain.   Yes [provider]  polyethylene glycol powder (GLYCOLAX/MIRALAX) powder Take 17 g by mouth 2 (two) times daily as needed. 05/03/16  Yes McVey, Gelene Mink, PA-C  pregabalin (LYRICA) 150 MG capsule TAKE 1 CAPSULE(150 MG) BY MOUTH TWICE DAILY 01/01/19  Yes Wendie Agreste, MD  promethazine-dextromethorphan (PROMETHAZINE-DM) 6.25-15 MG/5ML syrup Take 5 mLs by mouth 4 (four) times daily as needed for cough. 07/07/18  Yes Sagardia, Ines Bloomer, MD  Sennosides-Docusate Sodium (SENNA-DOCUSATE SODIUM PO) Take by mouth.   Yes [provider]   Social History   Socioeconomic History  . Marital status: Single    Spouse name: Not on file  . Number of children: Not on file  . Years of education: Not on file  . Highest education level: Not on file  Occupational History  . Not on file  Tobacco Use  . Smoking status: Former Smoker    Types: Cigarettes  . Smokeless tobacco: Never Used  . Tobacco comment: Quit two years ago.  Light smoker prior.   Substance and Sexual Activity  . Alcohol use: Yes    Alcohol/week: 3.0 standard drinks  Types: 3 Standard drinks or equivalent per week  . Drug use: No  . Sexual activity: Not on file  Other Topics Concern  . Not on file  Social History Narrative   Lives with wife, 2 adult children and 3 grandchildren   Has 2 deceased children - 4 children total   Has PhD in literature   Professor at [employer]   Social Determinants of Radio broadcast assistant Strain:   . Difficulty of Paying Living Expenses: Not on file  Food Insecurity:   . Worried About Charity fundraiser in the Last Year: Not on file  . Ran Out of Food in the Last Year: Not on file  Transportation Needs:   . Lack of Transportation (Medical): Not on file  . Lack of Transportation (Non-Medical): Not on file  Physical Activity:   . Days of Exercise per Week: Not on file  . Minutes of Exercise per Session: Not on file  Stress:   . Feeling of Stress : Not on file  Social Connections:   . Frequency of Communication with Friends and Family: Not on file  . Frequency of Social Gatherings with Friends and Family: Not on  file  . Attends Religious Services: Not on file  . Active Member of Clubs or Organizations: Not on file  . Attends Archivist Meetings: Not on file  . Marital Status: Not on file  Intimate Partner Violence:   . Fear of Current or Ex-Partner: Not on file  . Emotionally Abused: Not on file  . Physically Abused: Not on file  . Sexually Abused: Not on file    Review of Systems  Constitutional: Negative for fatigue and unexpected weight change.  Eyes: Negative for visual disturbance.  Respiratory: Negative for cough, chest tightness and shortness of breath.   Cardiovascular: Negative for chest pain, palpitations and leg swelling.  Gastrointestinal: Negative for abdominal pain and blood in stool.  Musculoskeletal: Positive for arthralgias (bilat knees, low back) and back pain.  Neurological: Negative for dizziness, light-headedness and headaches.   DG Lumbar Spine Complete  Result Date: 07/18/2019 CLINICAL DATA:  Back pain CT 03/03/2018 EXAM: LUMBAR SPINE - COMPLETE 4+ VIEW COMPARISON:  CT abdomen pelvis 03/03/2018 FINDINGS: Normal alignment of the vertebral bodies. No acute loss vertebral body height and disc height. No subluxation. There is anterior osteophytosis. There is a failure facet hypertrophy from L3 to S1. IMPRESSION: 1. No acute findings. 2. Severe facet hypertrophy in the lower lumbar spine Electronically Signed   By: Suzy Bouchard M.D.   On: 07/18/2019 16:16   DG Knee 3 Views Left  Result Date: 07/18/2019 CLINICAL DATA:  Knee pain, no injury. EXAM: LEFT KNEE - 3 VIEW COMPARISON:  None. FINDINGS: No joint effusion or fracture.  No degenerative changes. IMPRESSION: No findings to explain the patient's pain. Electronically Signed   By: Lorin Picket M.D.   On: 07/18/2019 16:15   DG Knee 3 Views Right  Result Date: 07/18/2019 CLINICAL DATA:  Pain, no injury. EXAM: RIGHT KNEE - 3 VIEW COMPARISON:  None. FINDINGS: No joint effusion or fracture. There is motion on the  [place] view. Minimal patellofemoral osteophytosis. IMPRESSION: 1. No acute findings. 2. Minimal patellofemoral osteophytosis. Electronically Signed   By: Lorin Picket M.D.   On: 07/18/2019 16:33    Objective:   Vitals:   07/18/19 1433 07/18/19 1438  BP: (!) 187/113 (!) 154/90  Pulse: 71   Temp: 98.2 F (36.8 C)   TempSrc:  Temporal   SpO2: 97%   Weight: 264 lb (119.7 kg)   Height: 5\' 7"  (1.702 m)     Physical Exam Vitals reviewed.  Constitutional:      Appearance: He is well-developed.  HENT:     Head: Normocephalic and atraumatic.  Eyes:     Pupils: Pupils are equal, round, and reactive to light.  Neck:     Vascular: No carotid bruit or JVD.  Cardiovascular:     Rate and Rhythm: Normal rate and regular rhythm.     Heart sounds: Normal heart sounds. No murmur.  Pulmonary:     Effort: Pulmonary effort is normal.     Breath sounds: Normal breath sounds. No rales.  Musculoskeletal:       Back:     Right knee: Bony tenderness (No apparent effusion, tender over patella with crepitus on flexion, extension.  Medial lateral joint lines nontender.) and crepitus present.     Left knee: Bony tenderness (Slight crepitus over patella, no apparent effusion, discomfort over patella with flexion/extension, medial lateral joint lines nontender.) and crepitus present.     Right lower leg: Edema (pedal 2+ bilateral. ) present.     Left lower leg: Edema present.  Skin:    General: Skin is warm and dry.  Neurological:     Mental Status: He is alert and oriented to person, place, and time.    DG Lumbar Spine Complete  Result Date: 07/18/2019 CLINICAL DATA:  Back pain CT 03/03/2018 EXAM: LUMBAR SPINE - COMPLETE 4+ VIEW COMPARISON:  CT abdomen pelvis 03/03/2018 FINDINGS: Normal alignment of the vertebral bodies. No acute loss vertebral body height and disc height. No subluxation. There is anterior osteophytosis. There is a failure facet hypertrophy from L3 to S1. IMPRESSION: 1. No acute  findings. 2. Severe facet hypertrophy in the lower lumbar spine Electronically Signed   By: Suzy Bouchard M.D.   On: 07/18/2019 16:16   DG Knee 3 Views Left  Result Date: 07/18/2019 CLINICAL DATA:  Knee pain, no injury. EXAM: LEFT KNEE - 3 VIEW COMPARISON:  None. FINDINGS: No joint effusion or fracture.  No degenerative changes. IMPRESSION: No findings to explain the patient's pain. Electronically Signed   By: Lorin Picket M.D.   On: 07/18/2019 16:15   DG Knee 3 Views Right  Result Date: 07/18/2019 CLINICAL DATA:  Pain, no injury. EXAM: RIGHT KNEE - 3 VIEW COMPARISON:  None. FINDINGS: No joint effusion or fracture. There is motion on the [place] view. Minimal patellofemoral osteophytosis. IMPRESSION: 1. No acute findings. 2. Minimal patellofemoral osteophytosis. Electronically Signed   By: Lorin Picket M.D.   On: 07/18/2019 16:33      Assessment & Plan:  Croy E Aubert is a 69 y.o. male . Type 2 diabetes mellitus with chronic kidney disease, without long-term current use of insulin, unspecified CKD stage ([place]) - Plan: Hemoglobin A1c  - check A1c, no med changes for now.   Essential hypertension - Plan: Lipid panel, Comprehensive metabolic panel  - elevated in office. Possible pain component. Labs above, no med changes for now. Recheck 1 week.   Peripheral edema  - chronic, continue lasix. Lowest effective dose lyrica.   Acute bilateral low back pain without sciatica - Plan: DG Lumbar Spine Complete, traMADol (ULTRAM) 50 MG tablet, cyclobenzaprine (FLEXERIL) 5 MG tablet  - sever facet arthropathy. Possible strain. Tylenol otc (avoid nsaids with CKD), flexeril short term, tramadol if more sever pain - side effects of meds discussed.   Pain  in patella, unspecified laterality - Plan: traMADol (ULTRAM) 50 MG tablet, CANCELED: DG Knee Complete 4 Views Right, CANCELED: DG Knee Complete 4 Views Left Pain in both knees, unspecified chronicity - Plan: traMADol (ULTRAM) 50 MG tablet,  CANCELED: DG Knee Complete 4 Views Right, CANCELED: DG Knee Complete 4 Views Left  - reassuring XR. Patellofemoral pain possible. Tylenol handout on knee pain, relative rest, recheck 1 week. Consider ortho eval.   Meds ordered this encounter  Medications  . traMADol (ULTRAM) 50 MG tablet    Sig: Take 1 tablet (50 mg total) by mouth every 8 (eight) hours as needed for up to 5 days.    Dispense:  15 tablet    Refill:  0  . cyclobenzaprine (FLEXERIL) 5 MG tablet    Sig: Take 1 tablet (5 mg total) by mouth at bedtime as needed for muscle spasms (start qhs prn due to sedation).    Dispense:  15 tablet    Refill:  0   Patient Instructions   Flexeril at bedtime if needed for spasm/back pain.  Tylenol can be used every 4-6 hours during the day.  If needed temporarily for more severe pain, I did write for a few tramadol.  Be careful with that medication as it can cause sedation, dizziness and risk of falling.  Do not combine with other sedating medicines.  If pain is not improved in the next week, I would recommend follow-up with an orthopedic specialist and happy to refer you.  Recheck in 1 week.  Can review labs at that time as well.  Return to the clinic or go to the nearest emergency room if any of your symptoms worsen or new symptoms occur.   Acute Knee Pain, Adult Acute knee pain is sudden and may be caused by damage, swelling, or irritation of the muscles and tissues that support your knee. The injury may result from:  A fall.  An injury to your knee from twisting motions.  A hit to the knee.  Infection. Acute knee pain may go away on its own with time and rest. If it does not, your health care provider may order tests to find the cause of the pain. These may include:  Imaging tests, such as an X-ray, MRI, or ultrasound.  Joint aspiration. In this test, fluid is removed from the knee.  Arthroscopy. In this test, a lighted tube is inserted into the knee and an image is projected  onto a TV screen.  Biopsy. In this test, a sample of tissue is removed from the body and studied under a microscope. Follow these instructions at home: Pay attention to any changes in your symptoms. Take these actions to relieve your pain. If you have a knee sleeve or brace:   Wear the sleeve or brace as told by your health care provider. Remove it only as told by your health care provider.  Loosen the sleeve or brace if your toes tingle, become numb, or turn cold and blue.  Keep the sleeve or brace clean.  If the sleeve or brace is not waterproof: ? Do not let it get wet. ? Cover it with a watertight covering when you take a bath or shower. Activity  Rest your knee.  Do not do things that cause pain or make pain worse.  Avoid high-impact activities or exercises, such as running, jumping rope, or doing jumping jacks.  Work with a physical therapist to make a safe exercise program, as recommended by your health care  provider. Do exercises as told by your physical therapist. Managing pain, stiffness, and swelling   If directed, put ice on the knee: ? Put ice in a plastic bag. ? Place a towel between your skin and the bag. ? Leave the ice on for 20 minutes, 2-3 times a day.  If directed, use an elastic bandage to put pressure (compression) on your injured knee. This may control swelling, give support, and help with discomfort. General instructions  Take over-the-counter and prescription medicines only as told by your health care provider.  Raise (elevate) your knee above the level of your heart when you are sitting or lying down.  Sleep with a pillow under your knee.  Do not use any products that contain nicotine or tobacco, such as cigarettes, e-cigarettes, and chewing tobacco. These can delay healing. If you need help quitting, ask your health care provider.  If you are overweight, work with your health care provider and a dietitian to set a weight-loss goal that is  healthy and reasonable for you. Extra weight can put pressure on your knee.  Keep all follow-up visits as told by your health care provider. This is important. Contact a health care provider if:  Your knee pain continues, changes, or gets worse.  You have a fever along with knee pain.  Your knee feels warm to the touch.  Your knee buckles or locks up. Get help right away if:  Your knee swells, and the swelling becomes worse.  You cannot move your knee.  You have severe pain in your knee. Summary  Acute knee pain can be caused by a fall, an injury, an infection, or damage, swelling, or irritation of the tissues that support your knee.  Your health care provider may perform tests to find out the cause of the pain.  Pay attention to any changes in your symptoms. Relieve your pain with rest, medicines, light activity, and use of ice.  Get help if your pain continues or becomes worse, your knee swells, or you cannot move your knee. This information is not intended to replace advice given to you by your health care provider. Make sure you discuss any questions you have with your health care provider. Document Revised: 11/10/2017 Document Reviewed: 11/10/2017 Elsevier Patient Education  [place].   Acute Back Pain, Adult Acute back pain is sudden and usually short-lived. It is often caused by an injury to the muscles and tissues in the back. The injury may result from:  A muscle or ligament getting overstretched or torn (strained). Ligaments are tissues that connect bones to each other. Lifting something improperly can cause a back strain.  Wear and tear (degeneration) of the spinal disks. Spinal disks are circular tissue that provides cushioning between the bones of the spine (vertebrae).  Twisting motions, such as while playing sports or doing yard work.  A hit to the back.  Arthritis. You may have a physical exam, lab tests, and imaging tests to find the cause of  your pain. Acute back pain usually goes away with rest and home care. Follow these instructions at home: Managing pain, stiffness, and swelling  Take over-the-counter and prescription medicines only as told by your health care provider.  Your health care provider may recommend applying ice during the first 24-48 hours after your pain starts. To do this: ? Put ice in a plastic bag. ? Place a towel between your skin and the bag. ? Leave the ice on for 20 minutes, 2-3 times  a day.  If directed, apply heat to the affected area as often as told by your health care provider. Use the heat source that your health care provider recommends, such as a moist heat pack or a heating pad. ? Place a towel between your skin and the heat source. ? Leave the heat on for 20-30 minutes. ? Remove the heat if your skin turns bright red. This is especially important if you are unable to feel pain, heat, or cold. You have a greater risk of getting burned. Activity   Do not stay in bed. Staying in bed for more than 1-2 days can delay your recovery.  Sit up and stand up straight. Avoid leaning forward when you sit, or hunching over when you stand. ? If you work at a desk, sit close to it so you do not need to lean over. Keep your chin tucked in. Keep your neck drawn back, and keep your elbows bent at a right angle. Your arms should look like the letter "L." ? Sit high and close to the steering wheel when you drive. Add lower back (lumbar) support to your car seat, if needed.  Take short walks on even surfaces as soon as you are able. Try to increase the length of time you walk each day.  Do not sit, drive, or stand in one place for more than 30 minutes at a time. Sitting or standing for long periods of time can put stress on your back.  Do not drive or use heavy machinery while taking prescription pain medicine.  Use proper lifting techniques. When you bend and lift, use positions that put less stress on your  back: ? [place] your knees. ? Keep the load close to your body. ? Avoid twisting.  Exercise regularly as told by your health care provider. Exercising helps your back heal faster and helps prevent back injuries by keeping muscles strong and flexible.  Work with a physical therapist to make a safe exercise program, as recommended by your health care provider. Do any exercises as told by your physical therapist. Lifestyle  Maintain a healthy weight. Extra weight puts stress on your back and makes it difficult to have good posture.  Avoid activities or situations that make you feel anxious or stressed. Stress and anxiety increase muscle tension and can make back pain worse. Learn ways to manage anxiety and stress, such as through exercise. General instructions  Sleep on a firm mattress in a comfortable position. Try lying on your side with your knees slightly bent. If you lie on your back, put a pillow under your knees.  Follow your treatment plan as told by your health care provider. This may include: ? Cognitive or behavioral therapy. ? Acupuncture or massage therapy. ? Meditation or yoga. Contact a health care provider if:  You have pain that is not relieved with rest or medicine.  You have increasing pain going down into your legs or buttocks.  Your pain does not improve after 2 weeks.  You have pain at night.  You lose weight without trying.  You have a fever or chills. Get help right away if:  You develop new bowel or bladder control problems.  You have unusual weakness or numbness in your arms or legs.  You develop nausea or vomiting.  You develop abdominal pain.  You feel faint. Summary  Acute back pain is sudden and usually short-lived.  Use proper lifting techniques. When you bend and lift, use positions that put  less stress on your back.  Take over-the-counter and prescription medicines and apply heat or ice as directed by your health care provider. This  information is not intended to replace advice given to you by your health care provider. Make sure you discuss any questions you have with your health care provider. Document Revised: 09/19/2018 Document Reviewed: 01/12/2017 Elsevier Patient Education  [employer].    If you have lab work done today you will be contacted with your lab results within the next 2 weeks.  If you have not heard from [place] then please contact us. The fastest way to get your results is to register for My Chart.   IF you received an x-ray today, you will receive an invoice from [facility]. Please contact [facility] at [phone] with questions or concerns regarding your invoice.   IF you received labwork today, you will receive an invoice from [place]. Please contact [facility] at [phone] with questions or concerns regarding your invoice.   Our billing staff will not be able to assist you with questions regarding bills from these companies.  You will be contacted with the lab results as soon as they are available. The fastest way to get your results is to activate your My Chart account. Instructions are located on the last page of this paperwork. If you have not heard from [place] regarding the results in 2 weeks, please contact this office.         Signed, Merri Ray, MD [facility]

## 2019-07-18 NOTE — Patient Instructions (Addendum)
Flexeril at bedtime if needed for spasm/back pain.  Tylenol can be used every 4-6 hours during the day.  If needed temporarily for more severe pain, I did write for a few tramadol.  Be careful with that medication as it can cause sedation, dizziness and risk of falling.  Do not combine with other sedating medicines.  If pain is not improved in the next week, I would recommend follow-up with an orthopedic specialist and happy to refer you.  Recheck in 1 week.  Can review labs at that time as well.  Return to the clinic or go to the nearest emergency room if any of your symptoms worsen or new symptoms occur.   Acute Knee Pain, Adult Acute knee pain is sudden and may be caused by damage, swelling, or irritation of the muscles and tissues that support your knee. The injury may result from:  A fall.  An injury to your knee from twisting motions.  A hit to the knee.  Infection. Acute knee pain may go away on its own with time and rest. If it does not, your health care provider may order tests to find the cause of the pain. These may include:  Imaging tests, such as an X-ray, MRI, or ultrasound.  Joint aspiration. In this test, fluid is removed from the knee.  Arthroscopy. In this test, a lighted tube is inserted into the knee and an image is projected onto a TV screen.  Biopsy. In this test, a sample of tissue is removed from the body and studied under a microscope. Follow these instructions at home: Pay attention to any changes in your symptoms. Take these actions to relieve your pain. If you have a knee sleeve or brace:   Wear the sleeve or brace as told by your health care provider. Remove it only as told by your health care provider.  Loosen the sleeve or brace if your toes tingle, become numb, or turn cold and blue.  Keep the sleeve or brace clean.  If the sleeve or brace is not waterproof: ? Do not let it get wet. ? Cover it with a watertight covering when you take a bath or  shower. Activity  Rest your knee.  Do not do things that cause pain or make pain worse.  Avoid high-impact activities or exercises, such as running, jumping rope, or doing jumping jacks.  Work with a physical therapist to make a safe exercise program, as recommended by your health care provider. Do exercises as told by your physical therapist. Managing pain, stiffness, and swelling   If directed, put ice on the knee: ? Put ice in a plastic bag. ? Place a towel between your skin and the bag. ? Leave the ice on for 20 minutes, 2-3 times a day.  If directed, use an elastic bandage to put pressure (compression) on your injured knee. This may control swelling, give support, and help with discomfort. General instructions  Take over-the-counter and prescription medicines only as told by your health care provider.  Raise (elevate) your knee above the level of your heart when you are sitting or lying down.  Sleep with a pillow under your knee.  Do not use any products that contain nicotine or tobacco, such as cigarettes, e-cigarettes, and chewing tobacco. These can delay healing. If you need help quitting, ask your health care provider.  If you are overweight, work with your health care provider and a dietitian to set a weight-loss goal that is healthy and reasonable  for you. Extra weight can put pressure on your knee.  Keep all follow-up visits as told by your health care provider. This is important. Contact a health care provider if:  Your knee pain continues, changes, or gets worse.  You have a fever along with knee pain.  Your knee feels warm to the touch.  Your knee buckles or locks up. Get help right away if:  Your knee swells, and the swelling becomes worse.  You cannot move your knee.  You have severe pain in your knee. Summary  Acute knee pain can be caused by a fall, an injury, an infection, or damage, swelling, or irritation of the tissues that support your  knee.  Your health care provider may perform tests to find out the cause of the pain.  Pay attention to any changes in your symptoms. Relieve your pain with rest, medicines, light activity, and use of ice.  Get help if your pain continues or becomes worse, your knee swells, or you cannot move your knee. This information is not intended to replace advice given to you by your health care provider. Make sure you discuss any questions you have with your health care provider. Document Revised: 11/10/2017 Document Reviewed: 11/10/2017 Elsevier Patient Education  [place].   Acute Back Pain, Adult Acute back pain is sudden and usually short-lived. It is often caused by an injury to the muscles and tissues in the back. The injury may result from:  A muscle or ligament getting overstretched or torn (strained). Ligaments are tissues that connect bones to each other. Lifting something improperly can cause a back strain.  Wear and tear (degeneration) of the spinal disks. Spinal disks are circular tissue that provides cushioning between the bones of the spine (vertebrae).  Twisting motions, such as while playing sports or doing yard work.  A hit to the back.  Arthritis. You may have a physical exam, lab tests, and imaging tests to find the cause of your pain. Acute back pain usually goes away with rest and home care. Follow these instructions at home: Managing pain, stiffness, and swelling  Take over-the-counter and prescription medicines only as told by your health care provider.  Your health care provider may recommend applying ice during the first 24-48 hours after your pain starts. To do this: ? Put ice in a plastic bag. ? Place a towel between your skin and the bag. ? Leave the ice on for 20 minutes, 2-3 times a day.  If directed, apply heat to the affected area as often as told by your health care provider. Use the heat source that your health care provider recommends, such as a  moist heat pack or a heating pad. ? Place a towel between your skin and the heat source. ? Leave the heat on for 20-30 minutes. ? Remove the heat if your skin turns bright red. This is especially important if you are unable to feel pain, heat, or cold. You have a greater risk of getting burned. Activity   Do not stay in bed. Staying in bed for more than 1-2 days can delay your recovery.  Sit up and stand up straight. Avoid leaning forward when you sit, or hunching over when you stand. ? If you work at a desk, sit close to it so you do not need to lean over. Keep your chin tucked in. Keep your neck drawn back, and keep your elbows bent at a right angle. Your arms should look like the letter "  L." ? Sit high and close to the steering wheel when you drive. Add lower back (lumbar) support to your car seat, if needed.  Take short walks on even surfaces as soon as you are able. Try to increase the length of time you walk each day.  Do not sit, drive, or stand in one place for more than 30 minutes at a time. Sitting or standing for long periods of time can put stress on your back.  Do not drive or use heavy machinery while taking prescription pain medicine.  Use proper lifting techniques. When you bend and lift, use positions that put less stress on your back: ? High Falls your knees. ? Keep the load close to your body. ? Avoid twisting.  Exercise regularly as told by your health care provider. Exercising helps your back heal faster and helps prevent back injuries by keeping muscles strong and flexible.  Work with a physical therapist to make a safe exercise program, as recommended by your health care provider. Do any exercises as told by your physical therapist. Lifestyle  Maintain a healthy weight. Extra weight puts stress on your back and makes it difficult to have good posture.  Avoid activities or situations that make you feel anxious or stressed. Stress and anxiety increase muscle tension and  can make back pain worse. Learn ways to manage anxiety and stress, such as through exercise. General instructions  Sleep on a firm mattress in a comfortable position. Try lying on your side with your knees slightly bent. If you lie on your back, put a pillow under your knees.  Follow your treatment plan as told by your health care provider. This may include: ? Cognitive or behavioral therapy. ? Acupuncture or massage therapy. ? Meditation or yoga. Contact a health care provider if:  You have pain that is not relieved with rest or medicine.  You have increasing pain going down into your legs or buttocks.  Your pain does not improve after 2 weeks.  You have pain at night.  You lose weight without trying.  You have a fever or chills. Get help right away if:  You develop new bowel or bladder control problems.  You have unusual weakness or numbness in your arms or legs.  You develop nausea or vomiting.  You develop abdominal pain.  You feel faint. Summary  Acute back pain is sudden and usually short-lived.  Use proper lifting techniques. When you bend and lift, use positions that put less stress on your back.  Take over-the-counter and prescription medicines and apply heat or ice as directed by your health care provider. This information is not intended to replace advice given to you by your health care provider. Make sure you discuss any questions you have with your health care provider. Document Revised: 09/19/2018 Document Reviewed: 01/12/2017 Elsevier Patient Education  [employer].    If you have lab work done today you will be contacted with your lab results within the next 2 weeks.  If you have not heard from [place] then please contact us. The fastest way to get your results is to register for My Chart.   IF you received an x-ray today, you will receive an invoice from [facility]. Please contact [facility] at [phone] with questions or  concerns regarding your invoice.   IF you received labwork today, you will receive an invoice from [place]. Please contact [facility] at [phone] with questions or concerns regarding your invoice.   Our billing staff will  not be able to assist you with questions regarding bills from these companies.  You will be contacted with the lab results as soon as they are available. The fastest way to get your results is to activate your My Chart account. Instructions are located on the last page of this paperwork. If you have not heard from [place] regarding the results in 2 weeks, please contact this office.

## 2019-07-19 LAB — COMPREHENSIVE METABOLIC PANEL
ALT: 18 IU/L (ref 0–44)
AST: 20 IU/L (ref 0–40)
Albumin/Globulin Ratio: 1.9 (ref 1.2–2.2)
Albumin: 4.4 g/dL (ref 3.8–4.8)
Alkaline Phosphatase: 97 IU/L (ref 39–117)
BUN/Creatinine Ratio: 11 (ref 10–24)
BUN: 19 mg/dL (ref 8–27)
Bilirubin Total: 0.3 mg/dL (ref 0.0–1.2)
CO2: 24 mmol/L (ref 20–29)
Calcium: 9.3 mg/dL (ref 8.6–10.2)
Chloride: 106 mmol/L (ref 96–106)
Creatinine, Ser: 1.71 mg/dL — ABNORMAL HIGH (ref 0.76–1.27)
GFR calc Af Amer: 47 mL/min/{1.73_m2} — ABNORMAL LOW (ref >59)
GFR calc non Af Amer: 41 mL/min/{1.73_m2} — ABNORMAL LOW (ref >59)
Globulin, Total: 2.3 g/dL (ref 1.5–4.5)
Glucose: 112 mg/dL — ABNORMAL HIGH (ref 65–99)
Potassium: 4 mmol/L (ref 3.5–5.2)
Sodium: 147 mmol/L — ABNORMAL HIGH (ref 134–144)
Total Protein: 6.7 g/dL (ref 6.0–8.5)

## 2019-07-19 LAB — LIPID PANEL
Chol/HDL Ratio: 3.5 ratio (ref 0.0–5.0)
Cholesterol, Total: 147 mg/dL (ref 100–199)
HDL: 42 mg/dL (ref >39)
LDL Chol Calc (NIH): 93 mg/dL (ref 0–99)
Triglycerides: 57 mg/dL (ref 0–149)
VLDL Cholesterol Cal: 12 mg/dL (ref 5–40)

## 2019-07-19 LAB — HEMOGLOBIN A1C
Est. average glucose Bld gHb Est-mCnc: 140 mg/dL
Hgb A1c MFr Bld: 6.5 % — ABNORMAL HIGH (ref 4.8–5.6)

## 2019-07-25 ENCOUNTER — Ambulatory Visit: Payer: BC Managed Care – PPO | Admitting: Family Medicine

## 2019-07-25 ENCOUNTER — Other Ambulatory Visit: Payer: Self-pay

## 2019-07-25 ENCOUNTER — Encounter: Payer: Self-pay | Admitting: Family Medicine

## 2019-07-25 VITALS — BP 132/86 | HR 75 | Temp 97.3°F | Ht 67.0 in | Wt 259.0 lb

## 2019-07-25 DIAGNOSIS — M5380 Other specified dorsopathies, site unspecified: Secondary | ICD-10-CM | POA: Diagnosis not present

## 2019-07-25 DIAGNOSIS — M545 Low back pain, unspecified: Secondary | ICD-10-CM

## 2019-07-25 DIAGNOSIS — M25561 Pain in right knee: Secondary | ICD-10-CM | POA: Diagnosis not present

## 2019-07-25 DIAGNOSIS — M25562 Pain in left knee: Secondary | ICD-10-CM

## 2019-07-25 MED ORDER — CYCLOBENZAPRINE HCL 5 MG PO TABS: 5.0000 mg | ORAL_TABLET | Freq: Every evening | ORAL | 0 refills | Status: DC | PRN

## 2019-07-25 MED ORDER — TRAMADOL HCL 50 MG PO TABS: 50.0000 mg | ORAL_TABLET | Freq: Three times a day (TID) | ORAL | 0 refills | Status: AC | PRN

## 2019-07-25 NOTE — Progress Notes (Signed)
Subjective:  Patient ID: Jeffrey Parks, male    DOB: 1950/12/16  Age: 69 y.o. MRN: [id number]  CC:  Chief Complaint  Patient presents with  . Follow-up    on back pain and pain in both knees. pt states pain has improved some since last visit. pt states his current pain leve is 6/10. pt states the pain he feels is pressure and tightnes. pt states he dosen't having diabetes and is concered as to why his chart states that he dose. pt state he was bordered line, but never over the line.      HPI [name] presents for   Low back pain: Discussed February 3.  Here for 1 week follow-up.  Severe facet arthropathy noted on x-ray.  Possible strain with underlying degenerative changes.  Treated with tramadol if needed, Tylenol over-the-counter, short-term Flexeril provided. Taking tramadol BID (#15 filled on 2/3) Flexeril QHS.  Back is also better.  No bowel or bladder incontinence, no saddle anesthesia, no lower extremity weakness. Still sore in middle but better. Tightness in back for awhile, pain recently - improved with above.   Bilateral knee pain:  Consistent with patellofemoral pain at last visit.  Reassuring x-rays.  Relative rest discussed, pain meds as above. Knee pain improving with tramadol 2 times per day.  Still small amount of knee pain in front - left greater than right.    Question above noted regarding diabetes diagnosis.  A1c in 2017 was 7.0, 6.7 in 2018, 7.4 in 2019, 6.5 in November 2019, 6.6 in July of last year, 6.5 recently.  Takes Metformin 500 mg twice daily.  Complicated by chronic kidney disease.  See most recent visit.  This was discussed with patient. History Patient Active Problem List   Diagnosis Date Noted  . Chronic kidney disease, stage III (moderate) 02/08/2018  . Stenosis of cervical spine with myelopathy ([place]) 06/22/2017  . Oropharyngeal dysphagia 06/22/2017  . Type 2 diabetes mellitus with complication ([place]) 0000000  . Gait instability  01/14/2017  . 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 Cervicalgia 01/14/2017  . Leg swelling 09/08/2016  . Sleep apnea 09/08/2016  . Dizziness 09/08/2016  . Fatigue 05/30/2015  . Hypogonadism male 05/22/2015  . Morbid obesity due to excess calories ([place]) 05/22/2015  . 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 Essential hypertension 05/07/2015  . Erectile dysfunction 05/07/2015   Past Medical History:  Diagnosis Date  . Hypertension   . Low testosterone   . Sickle cell trait [facility])    Past Surgical History:  Procedure Laterality Date  . neck surgery, Dec. 14,2018    . None     Allergies  Allergen Reactions  . Norvasc [Amlodipine]    Prior to Admission medications   Medication Sig Start Date End Date Taking? Authorizing Provider  acetaminophen (TYLENOL ARTHRITIS PAIN) 650 MG CR tablet Take 1 tablet (650 mg total) by mouth every 6 (six) hours as needed for pain. 06/09/16  Yes Plunkett, Loree Fee, MD  benzonatate (TESSALON) 200 MG capsule Take 1 capsule (200 mg total) by mouth 2 (two) times daily as needed for cough. 07/07/18  Yes Sagardia, Ines Bloomer, MD  bisacodyl (DULCOLAX) 5 MG EC tablet Take 1 tablet (5 mg total) by mouth daily as needed for moderate constipation. 12/02/16  Yes McVey, Gelene Mink, PA-C  carvedilol (COREG) 25 MG tablet TAKE 1 TABLET(25 MG) BY MOUTH TWICE DAILY WITH A MEAL 07/07/19  Yes Wendie Agreste, MD  cyclobenzaprine (FLEXERIL) 5 MG tablet Take 1 tablet (5 mg  total) by mouth at bedtime as needed for muscle spasms (start qhs prn due to sedation). 07/18/19  Yes Wendie Agreste, MD  furosemide (LASIX) 40 MG tablet TAKE 2 TABLETS(80 MG) BY MOUTH DAILY 07/07/19  Yes Wendie Agreste, MD  hydrALAZINE (APRESOLINE) 25 MG tablet TAKE 1 TABLET BY MOUTH THREE TIMES A DAY 07/07/19  Yes Wendie Agreste, MD  losartan-hydrochlorothiazide [facility]) 100-25 MG tablet TAKE 1 TABLET BY MOUTH EVERY DAY 07/07/19  Yes Wendie Agreste, MD  metFORMIN (GLUCOPHAGE) 500 MG tablet Take 1 tablet  (500 mg total) by mouth 2 (two) times daily with a meal. 11/21/17  Yes McVey, Gelene Mink, PA-C  oxyCODONE (OXYCONTIN) 10 mg 12 hr tablet Take 1 tablet (10 mg total) by mouth every 12 (twelve) hours. 06/10/18  Yes Forrest Moron, MD  oxyCODONE-acetaminophen (PERCOCET/ROXICET) 5-325 MG tablet Take by mouth every 4 (four) hours as needed for severe pain.   Yes [provider]  polyethylene glycol powder (GLYCOLAX/MIRALAX) powder Take 17 g by mouth 2 (two) times daily as needed. 05/03/16  Yes McVey, Gelene Mink, PA-C  pregabalin (LYRICA) 150 MG capsule TAKE 1 CAPSULE(150 MG) BY MOUTH TWICE DAILY 01/01/19  Yes Wendie Agreste, MD  promethazine-dextromethorphan (PROMETHAZINE-DM) 6.25-15 MG/5ML syrup Take 5 mLs by mouth 4 (four) times daily as needed for cough. 07/07/18  Yes Sagardia, Ines Bloomer, MD  Sennosides-Docusate Sodium (SENNA-DOCUSATE SODIUM PO) Take by mouth.   Yes [provider]   Social History   Socioeconomic History  . Marital status: Single    Spouse name: Not on file  . Number of children: Not on file  . Years of education: Not on file  . Highest education level: Not on file  Occupational History  . Not on file  Tobacco Use  . Smoking status: Former Smoker    Types: Cigarettes  . Smokeless tobacco: Never Used  . Tobacco comment: Quit two years ago.  Light smoker prior.   Substance and Sexual Activity  . Alcohol use: Yes    Alcohol/week: 3.0 standard drinks    Types: 3 Standard drinks or equivalent per week  . Drug use: No  . Sexual activity: Not on file  Other Topics Concern  . Not on file  Social History Narrative   Lives with wife, 2 adult children and 3 grandchildren   Has 2 deceased children - 4 children total   Has PhD in literature   Professor at [employer]   Social Determinants of Radio broadcast assistant Strain:   . Difficulty of Paying Living Expenses: Not on file  Food Insecurity:   . Worried About Sales executive in the Last Year: Not on file  . Ran Out of Food in the Last Year: Not on file  Transportation Needs:   . Lack of Transportation (Medical): Not on file  . Lack of Transportation (Non-Medical): Not on file  Physical Activity:   . Days of Exercise per Week: Not on file  . Minutes of Exercise per Session: Not on file  Stress:   . Feeling of Stress : Not on file  Social Connections:   . Frequency of Communication with Friends and Family: Not on file  . Frequency of Social Gatherings with Friends and Family: Not on file  . Attends Religious Services: Not on file  . Active Member of Clubs or Organizations: Not on file  . Attends Archivist Meetings: Not on file  . Marital Status: Not on  file  Intimate Partner Violence:   . Fear of Current or Ex-Partner: Not on file  . Emotionally Abused: Not on file  . Physically Abused: Not on file  . Sexually Abused: Not on file    Review of Systems   Objective:   Vitals:   07/25/19 1437 07/25/19 1447  BP: (!) 180/107 132/86  Pulse: 75   Temp: (!) 97.3 F (36.3 C)   TempSrc: Temporal   SpO2: 97%   Weight: 259 lb (117.5 kg)   Height: 5\' 7"  (1.702 m)      Physical Exam Constitutional:      General: He is not in acute distress.    Appearance: He is well-developed.  HENT:     Head: Normocephalic and atraumatic.  Cardiovascular:     Rate and Rhythm: Normal rate.  Pulmonary:     Effort: Pulmonary effort is normal.  Musculoskeletal:       Legs:  Neurological:     Mental Status: He is alert and oriented to person, place, and time.     Back, lumbar spine, no focal bony tenderness, no significant spasm or tenderness appreciated.  Ambulates with cane.   Assessment & Plan:  Roben E Tatsch is a 69 y.o. male . Back tightness - Plan: Ambulatory referral to Spine Surgery Acute bilateral low back pain without sciatica - Plan: cyclobenzaprine (FLEXERIL) 5 MG tablet, traMADol (ULTRAM) 50 MG tablet  -Recent acute back pain  with chronic back tightness, who has required narcotic pain medication for back pain, neck pain in the past.  Recently has had improvement with tramadol, Flexeril.  Facet hypertrophy noted.  Suspect physical therapy may be helpful but will refer to back specialist to determine next course of action including PT versus injection.  Short-term refill tramadol provided.  Discussed need for pain management if  chronic need for narcotic pain medications.  Pain in both knees, unspecified chronicity  -Patellofemoral pain, improving.  Reassuring previous x-ray.  Option of orthopedic follow-up if not continue to improve or continued need for tramadol.   Meds ordered this encounter  Medications  . cyclobenzaprine (FLEXERIL) 5 MG tablet    Sig: Take 1 tablet (5 mg total) by mouth at bedtime as needed for muscle spasms (start qhs prn due to sedation).    Dispense:  15 tablet    Refill:  0  . traMADol (ULTRAM) 50 MG tablet    Sig: Take 1 tablet (50 mg total) by mouth every 8 (eight) hours as needed for up to 5 days.    Dispense:  15 tablet    Refill:  0   Patient Instructions    I will refer you to back specialist. They may recommend physical therapy initially, but can discuss best approach at their visit. As pain improves, stop using tramadol and try tylenol.  Flexeril as needed only for spasm. I will refill tramadol once for now.   If knee pain does not continue to improve in the next week or 2, let me know and I am happy to also refer you to a knee specialist.  As we discussed your blood sugars have been at the levels of diabetes.  Metformin is helping to keep those levels controlled.  Continue to watch diet, portion sizes, and activity/exercise can also help with weight loss, but hopefully physical therapy or other treatment decided by back specialist can help with that activity/exercise.  Recheck in 3 months for diabetes and other medications, sooner if worse.   If you  have lab work done today  you will be contacted with your lab results within the next 2 weeks.  If you have not heard from [place] then please contact us. The fastest way to get your results is to register for My Chart.   IF you received an x-ray today, you will receive an invoice from [facility]. Please contact [facility] at [phone] with questions or concerns regarding your invoice.   IF you received labwork today, you will receive an invoice from [place]. Please contact [facility] at [phone] with questions or concerns regarding your invoice.   Our billing staff will not be able to assist you with questions regarding bills from these companies.  You will be contacted with the lab results as soon as they are available. The fastest way to get your results is to activate your My Chart account. Instructions are located on the last page of this paperwork. If you have not heard from [place] regarding the results in 2 weeks, please contact this office.         Signed, Merri Ray, MD [facility]

## 2019-07-25 NOTE — Patient Instructions (Addendum)
  I will refer you to back specialist. They may recommend physical therapy initially, but can discuss best approach at their visit. As pain improves, stop using tramadol and try tylenol.  Flexeril as needed only for spasm. I will refill tramadol once for now.   If knee pain does not continue to improve in the next week or 2, let me know and I am happy to also refer you to a knee specialist.  As we discussed your blood sugars have been at the levels of diabetes.  Metformin is helping to keep those levels controlled.  Continue to watch diet, portion sizes, and activity/exercise can also help with weight loss, but hopefully physical therapy or other treatment decided by back specialist can help with that activity/exercise.  Recheck in 3 months for diabetes and other medications, sooner if worse.   If you have lab work done today you will be contacted with your lab results within the next 2 weeks.  If you have not heard from [place] then please contact us. The fastest way to get your results is to register for My Chart.   IF you received an x-ray today, you will receive an invoice from [facility]. Please contact [facility] at [phone] with questions or concerns regarding your invoice.   IF you received labwork today, you will receive an invoice from [place]. Please contact [facility] at [phone] with questions or concerns regarding your invoice.   Our billing staff will not be able to assist you with questions regarding bills from these companies.  You will be contacted with the lab results as soon as they are available. The fastest way to get your results is to activate your My Chart account. Instructions are located on the last page of this paperwork. If you have not heard from [place] regarding the results in 2 weeks, please contact this office.

## 2019-07-26 ENCOUNTER — Ambulatory Visit: Payer: BC Managed Care – PPO | Attending: Family

## 2019-07-26 DIAGNOSIS — Z23 Encounter for immunization: Secondary | ICD-10-CM | POA: Insufficient documentation

## 2019-07-26 NOTE — Progress Notes (Signed)
   Covid-19 Vaccination Clinic  Name:  Jeffrey Parks    MRN: [id number] DOB: 12-11-1950  07/26/2019  Jeffrey Parks was observed post Covid-19 immunization for 15 minutes without incidence. He was provided with Vaccine Information Sheet and instruction to access the V-Safe system.   Jeffrey Parks was instructed to call 911 with any severe reactions post vaccine: Marland Kitchen Difficulty breathing  . Swelling of your face and throat  . A fast heartbeat  . A bad rash all over your body  . Dizziness and weakness    Immunizations Administered    Name Date Dose VIS Date Route   Moderna COVID-19 Vaccine 07/26/2019  1:26 PM 0.5 mL 05/15/2019 Intramuscular   Manufacturer: Moderna   Lot: [id number]   [id number]

## 2019-07-30 ENCOUNTER — Other Ambulatory Visit: Payer: Self-pay | Admitting: Family Medicine

## 2019-07-30 DIAGNOSIS — M792 Neuralgia and neuritis, unspecified: Secondary | ICD-10-CM

## 2019-07-30 NOTE — Telephone Encounter (Signed)
Requested medication (s) are due for refill today: yes  Requested medication (s) are on the active medication list:yes  Last refill: 01/01/2019  Future visit scheduled yes  10/24/2019  Notes to clinic:not delegated  Requested Prescriptions  Pending Prescriptions Disp Refills   pregabalin (LYRICA) 150 MG capsule [Pharmacy Med Name: PREGABALIN 150 MG CAPSULE] 60 capsule     Sig: TAKE 1 CAPSULE(150 MG) BY MOUTH TWICE DAILY      Not Delegated - Neurology:  Anticonvulsants - Controlled Failed - 07/30/2019  8:49 AM      Failed - This refill cannot be delegated      Passed - Valid encounter within last 12 months    Recent Outpatient Visits           5 days ago Back tightness   [facility], Ranell Patrick, MD   1 week ago Type 2 diabetes mellitus with chronic kidney disease, without long-term current use of insulin, unspecified CKD stage [facility])   [facility], Ranell Patrick, MD   3 months ago Type 2 diabetes mellitus with chronic kidney disease, without long-term current use of insulin, unspecified CKD stage [facility])   [facility], Ranell Patrick, MD   7 months ago Type 2 diabetes mellitus with chronic kidney disease, without long-term current use of insulin, unspecified CKD stage [facility])   [facility], Ranell Patrick, MD   1 year ago Essential hypertension   [facility] at [facility], Ines Bloomer, MD       Future Appointments             In 2 months Carlota Raspberry Ranell Patrick, MD [facility] at [place], [facility]

## 2019-08-07 NOTE — Telephone Encounter (Signed)
Controlled substance database (PDMP) reviewed. No concerns appreciated.  Option of once per day has been discussed previously due to edema.  On last notes appears he was taken once per day, but has required twice per day previously.  Dosing given for 1 to 2/day, lowest effective dose.  Refill ordered.

## 2019-08-07 NOTE — Telephone Encounter (Signed)
Pt is requesting refill on lyrica. Your note states this could be the cause of the swelling or you would refer to pain management or lower the dose.   I can refill the Lyrica, but if oxycontin needed, I can refer you to pain management. Let me know.  High dose Lyrica you are taking may also be worsening leg swelling, so would recommend lower dose if pain is improving. Let me know.

## 2019-08-23 ENCOUNTER — Other Ambulatory Visit: Payer: Self-pay | Admitting: Neurosurgery

## 2019-08-23 DIAGNOSIS — M48062 Spinal stenosis, lumbar region with neurogenic claudication: Secondary | ICD-10-CM

## 2019-08-28 ENCOUNTER — Ambulatory Visit: Payer: BC Managed Care – PPO | Attending: Family

## 2019-08-28 DIAGNOSIS — Z23 Encounter for immunization: Secondary | ICD-10-CM

## 2019-08-28 NOTE — Progress Notes (Signed)
   Covid-19 Vaccination Clinic  Name:  SHAVAR JAWOROWSKI    MRN: [id number] DOB: Mar 11, 1951  08/28/2019  Mr. Daoust was observed post Covid-19 immunization for 15 minutes without incident. He was provided with Vaccine Information Sheet and instruction to access the V-Safe system.   Mr. Krebs was instructed to call 911 with any severe reactions post vaccine: Marland Kitchen Difficulty breathing  . Swelling of face and throat  . A fast heartbeat  . A bad rash all over body  . Dizziness and weakness   Immunizations Administered    Name Date Dose VIS Date Route   Moderna COVID-19 Vaccine 08/28/2019  2:14 PM 0.5 mL 05/15/2019 Intramuscular   Manufacturer: Moderna   Lot: [id number]   McKinleyDW:[id number]

## 2019-08-31 ENCOUNTER — Other Ambulatory Visit: Payer: Self-pay

## 2019-08-31 ENCOUNTER — Ambulatory Visit
Admission: RE | Admit: 2019-08-31 | Discharge: 2019-08-31 | Disposition: A | Discharge status: Home / Self Care | Payer: BC Managed Care – PPO | Source: Ambulatory Visit | Attending: Neurosurgery | Admitting: Neurosurgery

## 2019-08-31 DIAGNOSIS — M48062 Spinal stenosis, lumbar region with neurogenic claudication: Secondary | ICD-10-CM

## 2019-09-18 ENCOUNTER — Encounter: Payer: Self-pay | Admitting: Family Medicine

## 2019-09-18 NOTE — Telephone Encounter (Signed)
Pt asking for next steps following MRI results.

## 2019-10-11 ENCOUNTER — Other Ambulatory Visit: Payer: Self-pay | Admitting: Family Medicine

## 2019-10-11 DIAGNOSIS — I160 Hypertensive urgency: Secondary | ICD-10-CM

## 2019-10-11 DIAGNOSIS — I10 Essential (primary) hypertension: Secondary | ICD-10-CM

## 2019-10-24 ENCOUNTER — Other Ambulatory Visit: Payer: Self-pay

## 2019-10-24 ENCOUNTER — Ambulatory Visit: Payer: BC Managed Care – PPO | Admitting: Family Medicine

## 2019-10-24 ENCOUNTER — Encounter: Payer: Self-pay | Admitting: Family Medicine

## 2019-10-24 VITALS — BP 150/92 | HR 75 | Temp 97.6°F | Resp 16 | Ht 67.0 in | Wt 257.0 lb

## 2019-10-24 DIAGNOSIS — I10 Essential (primary) hypertension: Secondary | ICD-10-CM | POA: Diagnosis not present

## 2019-10-24 DIAGNOSIS — E1122 Type 2 diabetes mellitus with diabetic chronic kidney disease: Secondary | ICD-10-CM | POA: Diagnosis not present

## 2019-10-24 DIAGNOSIS — R609 Edema, unspecified: Secondary | ICD-10-CM

## 2019-10-24 DIAGNOSIS — M792 Neuralgia and neuritis, unspecified: Secondary | ICD-10-CM

## 2019-10-24 MED ORDER — METFORMIN HCL 500 MG PO TABS: 500.0000 mg | ORAL_TABLET | Freq: Two times a day (BID) | ORAL | 3 refills | Status: DC

## 2019-10-24 MED ORDER — PREGABALIN 150 MG PO CAPS: 150.0000 mg | ORAL_CAPSULE | Freq: Two times a day (BID) | ORAL | 1 refills | Status: DC | PRN

## 2019-10-24 MED ORDER — LOSARTAN POTASSIUM-HCTZ 100-25 MG PO TABS: 1.0000 | ORAL_TABLET | Freq: Every day | ORAL | 1 refills | Status: DC

## 2019-10-24 MED ORDER — ATORVASTATIN CALCIUM 10 MG PO TABS: 10.0000 mg | ORAL_TABLET | Freq: Every day | ORAL | 0 refills | Status: DC

## 2019-10-24 MED ORDER — HYDRALAZINE HCL 25 MG PO TABS: 25.0000 mg | ORAL_TABLET | Freq: Three times a day (TID) | ORAL | 1 refills | Status: DC

## 2019-10-24 MED ORDER — CARVEDILOL 25 MG PO TABS: 25.0000 mg | ORAL_TABLET | Freq: Two times a day (BID) | ORAL | 1 refills | Status: DC

## 2019-10-24 NOTE — Progress Notes (Signed)
Subjective:  Patient ID: Jeffrey Parks, male    DOB: 07/17/1950  Age: 69 y.o. MRN: [id number]  CC:  Chief Complaint  Patient presents with  . Diabetes    pt has been taking sugar at home sometimes 110, 120 states it does fluctuate, pt denies side effects.     HPI Jeffrey Parks presents for   Diabetes: Complicated by chronic kidney disease.  Followed by nephrology, stage III. He is on ARB, Metformin 500 mg twice daily. Home readings: 110 after meal. No symptomatic lows.  Microalbumin: Normal ratio 01/01/2019 Optho, foot exam, pneumovax: Up-to-date  Lab Results  Component Value Date   HGBA1C 6.5 (H) 07/18/2019   HGBA1C 6.6 (H) 01/01/2019   HGBA1C 6.5 (A) 04/21/2018   Lab Results  Component Value Date   LDLCALC 93 07/18/2019   CREATININE 1.71 (H) 07/18/2019   Hypertension: Hydralazine 25 mg 3 times daily, losartan hydrochlorothiazide 100/25 mg daily, coreg 25mg  BID. Lasix 80mg  qd for chronic peripheral edema.  Usually wears compression stockings,  not wearing today.  Home readings: 140-150/70-80. No missed doses or new side effects.  Told BP ok at kidney specialist last week. No changes.  BP Readings from Last 3 Encounters:  10/24/19 (!) 150/92  07/25/19 132/86  07/18/19 (!) 154/90   Lab Results  Component Value Date   CREATININE 1.71 (H) 07/18/2019    Lyrica for history of C-spine stenosis or myelopathy, lumbar disease as well, now followed by Dr. Annette Stable with neurosurgery, had MRI, has not had follow-up to discuss further.  Requests number today.  History Patient Active Problem List   Diagnosis Date Noted  . Chronic kidney disease, stage III (moderate) 02/08/2018  . Stenosis of cervical spine with myelopathy ([place]) 06/22/2017  . Oropharyngeal dysphagia 06/22/2017  . Type 2 diabetes mellitus with complication ([place]) 0000000  . Gait instability 01/14/2017  . 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 Cervicalgia 01/14/2017  . Leg swelling 09/08/2016  . Sleep apnea  09/08/2016  . Dizziness 09/08/2016  . Fatigue 05/30/2015  . Hypogonadism male 05/22/2015  . Morbid obesity due to excess calories ([place]) 05/22/2015  . 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 Essential hypertension 05/07/2015  . Erectile dysfunction 05/07/2015   Past Medical History:  Diagnosis Date  . Hypertension   . Low testosterone   . [facility]ick[facility] cell trait [facility])    Past Surgical History:  Procedure Laterality Date  . neck surgery, Dec. 14,2018    . None     Allergies  Allergen Reactions  . Norvasc [Amlodipine]    Prior to Admission medications   Medication Sig Start Date End Date Taking? Authorizing Provider  acetaminophen (TYLENOL ARTHRITIS PAIN) 650 MG CR tablet Take 1 tablet (650 mg total) by mouth every 6 (six) hours as needed for pain. 06/09/16  Yes Plunkett, Loree Fee, MD  benzonatate (TESSALON) 200 MG capsule Take 1 capsule (200 mg total) by mouth 2 (two) times daily as needed for cough. 07/07/18  Yes Sagardia, Ines Bloomer, MD  bisacodyl (DULCOLAX) 5 MG EC tablet Take 1 tablet (5 mg total) by mouth daily as needed for moderate constipation. 12/02/16  Yes McVey, Gelene Mink, PA-C  carvedilol (COREG) 25 MG tablet TAKE 1 TABLET(25 MG) BY MOUTH TWICE DAILY WITH A MEAL 10/11/19  Yes Wendie Agreste, MD  cyclobenzaprine (FLEXERIL) 5 MG tablet Take 1 tablet (5 mg total) by mouth at bedtime as needed for muscle spasms (start qhs prn due to sedation). 07/25/19  Yes Wendie Agreste, MD  furosemide (LASIX)  40 MG tablet TAKE 2 TABLETS BY MOUTH EVERY DAY 10/11/19  Yes Wendie Agreste, MD  hydrALAZINE (APRESOLINE) 25 MG tablet TAKE 1 TABLET BY MOUTH THREE TIMES A DAY 10/11/19  Yes Wendie Agreste, MD  losartan-hydrochlorothiazide [facility]) 100-25 MG tablet TAKE 1 TABLET BY MOUTH EVERY DAY 10/11/19  Yes Wendie Agreste, MD  metFORMIN (GLUCOPHAGE) 500 MG tablet Take 1 tablet (500 mg total) by mouth 2 (two) times daily with a meal. 11/21/17  Yes McVey, Gelene Mink, PA-C    oxyCODONE-acetaminophen (PERCOCET/ROXICET) 5-325 MG tablet Take by mouth every 4 (four) hours as needed for severe pain.   Yes [provider]  polyethylene glycol powder (GLYCOLAX/MIRALAX) powder Take 17 g by mouth 2 (two) times daily as needed. 05/03/16  Yes McVey, Gelene Mink, PA-C  pregabalin (LYRICA) 150 MG capsule Take 1 capsule (150 mg total) by mouth 2 (two) times daily as needed. Take 1 po qd if symptoms controlled at this dose. 08/07/19  Yes Wendie Agreste, MD  promethazine-dextromethorphan (PROMETHAZINE-DM) 6.25-15 MG/5ML syrup Take 5 mLs by mouth 4 (four) times daily as needed for cough. 07/07/18  Yes Sagardia, Ines Bloomer, MD  Sennosides-Docusate Sodium (SENNA-DOCUSATE SODIUM PO) Take by mouth.   Yes [provider]   Social History   Socioeconomic History  . Marital status: Single    Spouse name: Not on file  . Number of children: Not on file  . Years of education: Not on file  . Highest education level: Not on file  Occupational History  . Not on file  Tobacco Use  . Smoking status: Former Smoker    Types: Cigarettes  . Smokeless tobacco: Never Used  . Tobacco comment: Quit two years ago.  Light smoker prior.   Substance and Sexual Activity  . Alcohol use: Yes    Alcohol/week: 3.0 standard drinks    Types: 3 Standard drinks or equivalent per week  . Drug use: No  . Sexual activity: Not on file  Other Topics Concern  . Not on file  Social History Narrative   Lives with wife, 2 adult children and 3 grandchildren   Has 2 deceased children - 4 children total   Has PhD in literature   Professor at [employer]   Social Determinants of Radio broadcast assistant Strain:   . Difficulty of Paying Living Expenses:   Food Insecurity:   . Worried About Charity fundraiser in the Last Year:   . Arboriculturist in the Last Year:   Transportation Needs:   . Film/video editor (Medical):   Marland Kitchen Lack of Transportation (Non-Medical):    Physical Activity:   . Days of Exercise per Week:   . Minutes of Exercise per Session:   Stress:   . Feeling of Stress :   Social Connections:   . Frequency of Communication with Friends and Family:   . Frequency of Social Gatherings with Friends and Family:   . Attends Religious Services:   . Active Member of Clubs or Organizations:   . Attends Archivist Meetings:   Marland Kitchen Marital Status:   Intimate Partner Violence:   . Fear of Current or Ex-Partner:   . Emotionally Abused:   Marland Kitchen Physically Abused:   . Sexually Abused:     Review of Systems  Constitutional: Negative for fatigue and unexpected weight change.  Eyes: Negative for visual disturbance.  Respiratory: Negative for cough, chest tightness and shortness of breath.   Cardiovascular:  Negative for chest pain, palpitations and leg swelling.  Gastrointestinal: Negative for abdominal pain and blood in stool.  Neurological: Negative for dizziness, light-headedness and headaches.     Objective:   Vitals:   10/24/19 1449 10/24/19 1452  BP: (!) 174/107 (!) 150/92  Pulse: 75   Resp: 16   Temp: 97.6 F (36.4 C)   TempSrc: Temporal   SpO2: 96%   Weight: 257 lb (116.6 kg)   Height: 5\' 7"  (1.702 m)      Physical Exam Vitals reviewed.  Constitutional:      Appearance: He is well-developed.  HENT:     Head: Normocephalic and atraumatic.  Eyes:     Pupils: Pupils are equal, round, and reactive to light.  Neck:     Vascular: No carotid bruit or JVD.  Cardiovascular:     Rate and Rhythm: Normal rate and regular rhythm.     Heart sounds: Normal heart sounds. No murmur.  Pulmonary:     Effort: Pulmonary effort is normal.     Breath sounds: Normal breath sounds. No rales.  Musculoskeletal:     Right lower leg: Edema present.     Left lower leg: Edema present.  Skin:    General: Skin is warm and dry.  Neurological:     Mental Status: He is alert and oriented to person, place, and time.        Assessment  & Plan:  Jeffrey Parks is a 69 y.o. male . Peripheral edema  -Continue Lasix, usually uses compression stockings.  Essential hypertension - Plan: carvedilol (COREG) 25 MG tablet, hydrALAZINE (APRESOLINE) 25 MG tablet, losartan-hydrochlorothiazide (HYZAAR) 100-25 MG tablet  -Borderline elevated in office, reportedly was normal at recent nephrology evaluation.  Will watch for that record.  Home monitoring recommended with RTC precautions.  Continue same doses of carvedilol, hydralazine, losartan HCTZ for now.  Neuropathic pain - Plan: pregabalin (LYRICA) 150 MG capsule  -Previously stable, refill Lyrica.  Type 2 diabetes mellitus with chronic kidney disease, without long-term current use of insulin, unspecified CKD stage ([place]) - Plan: metFORMIN (GLUCOPHAGE) 500 MG tablet, atorvastatin (LIPITOR) 10 MG tablet  -Start statin with history of diabetes, continue Metformin same dose, check labs in 6 weeks with repeat LFTs at that time  Meds ordered this encounter  Medications  . carvedilol (COREG) 25 MG tablet    Sig: Take 1 tablet (25 mg total) by mouth 2 (two) times daily with a meal.    Dispense:  180 tablet    Refill:  1  . hydrALAZINE (APRESOLINE) 25 MG tablet    Sig: Take 1 tablet (25 mg total) by mouth 3 (three) times daily.    Dispense:  270 tablet    Refill:  1  . losartan-hydrochlorothiazide (HYZAAR) 100-25 MG tablet    Sig: Take 1 tablet by mouth daily.    Dispense:  90 tablet    Refill:  1  . pregabalin (LYRICA) 150 MG capsule    Sig: Take 1 capsule (150 mg total) by mouth 2 (two) times daily as needed. Take 1 po qd if symptoms controlled at this dose.    Dispense:  60 capsule    Refill:  1    This request is for a new prescription for a controlled substance as required by Federal/State law.  . metFORMIN (GLUCOPHAGE) 500 MG tablet    Sig: Take 1 tablet (500 mg total) by mouth 2 (two) times daily with a meal.    Dispense:  180  tablet    Refill:  3  . atorvastatin (LIPITOR)  10 MG tablet    Sig: Take 1 tablet (10 mg total) by mouth daily.    Dispense:  90 tablet    Refill:  0   Patient Instructions   lipitor once per day if possible for cholesterol and diabetes. Can decrease to every few days if needed for any side effects.  No change in meds for now, follow-up in 6 weeks to recheck blood pressure and lab work at that time including liver test on cholesterol medicine.  Make sure to continue to wear compression stockings to help with swelling in legs.  Call Dr. Annette Stable to discuss your MRI results. They may want to discuss results and plan in office. Here is his number.   Jeffrey A. Pool, MD Neurosurgeon in [place], [place] Get online care: [email or web address] Phone: [phone]  If you have lab work done today you will be contacted with your lab results within the next 2 weeks.  If you have not heard from [place] then please contact us. The fastest way to get your results is to register for My Chart.   IF you received an x-ray today, you will receive an invoice from [facility]. Please contact [facility] at [phone] with questions or concerns regarding your invoice.   IF you received labwork today, you will receive an invoice from [facility]. Please contact [facility] at [phone] with questions or concerns regarding your invoice.   Our billing staff will not be able to assist you with questions regarding bills from these companies.  You will be contacted with the lab results as soon as they are available. The fastest way to get your results is to activate your My Chart account. Instructions are located on the last page of this paperwork. If you have not heard from [place] regarding the results in 2 weeks, please contact this office.       Signed, Merri Ray, MD [facility]

## 2019-10-24 NOTE — Patient Instructions (Addendum)
lipitor once per day if possible for cholesterol and diabetes. Can decrease to every few days if needed for any side effects.  No change in meds for now, follow-up in 6 weeks to recheck blood pressure and lab work at that time including liver test on cholesterol medicine.  Make sure to continue to wear compression stockings to help with swelling in legs.  Call Dr. Annette Stable to discuss your MRI results. They may want to discuss results and plan in office. Here is his number.   Jeffrey A. Pool, MD Neurosurgeon in [place], [name] Get online care: [email or web address] Phone: [phone]  If you have lab work done today you will be contacted with your lab results within the next 2 weeks.  If you have not heard from [place] then please contact us. The fastest way to get your results is to register for My Chart.   IF you received an x-ray today, you will receive an invoice from [facility]. Please contact [facility] at [phone] with questions or concerns regarding your invoice.   IF you received labwork today, you will receive an invoice from [facility]. Please contact [facility] at [phone] with questions or concerns regarding your invoice.   Our billing staff will not be able to assist you with questions regarding bills from these companies.  You will be contacted with the lab results as soon as they are available. The fastest way to get your results is to activate your My Chart account. Instructions are located on the last page of this paperwork. If you have not heard from [place] regarding the results in 2 weeks, please contact this office.

## 2019-11-07 IMAGING — CT CT CHEST W/O CM
2 of 4 series · 13 of 36 positions shown, 16 images · non-contrast
Comparison: Bone survey from 01/20/2018 and renal ultrasound from
01/20/2018

CLINICAL DATA: IgM monoclonal gammopathy of uncertain significance,
assessment for lymphoplasmacytic lymphoma.

EXAM:
CT CHEST, ABDOMEN AND PELVIS WITHOUT CONTRAST
TECHNIQUE: Multidetector CT imaging of the chest, abdomen and pelvis was
performed following the standard protocol without IV contrast.

[Series 2: cap w/o · axial · non-contrast · 0.98mm/px · z∈[-724,-164]mm · 10 of 136 slices shown, 13 images]
[im 12/136  mediastinal]
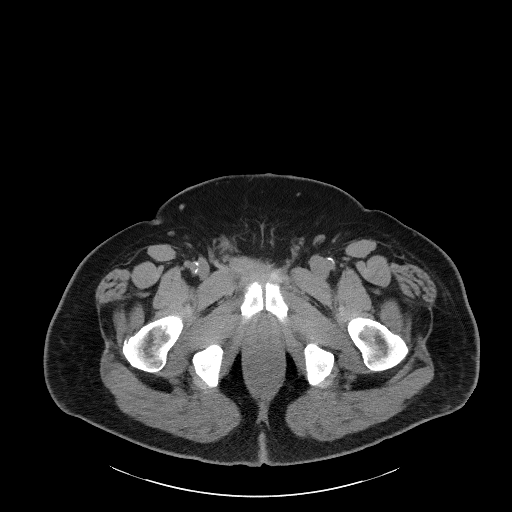
[im 12/136  lung]
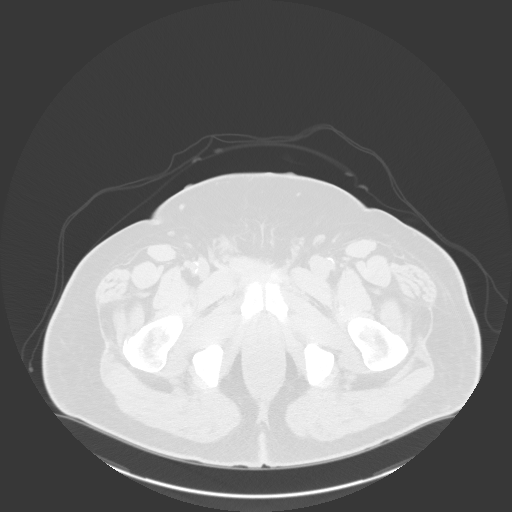
[im 23/136  lung]
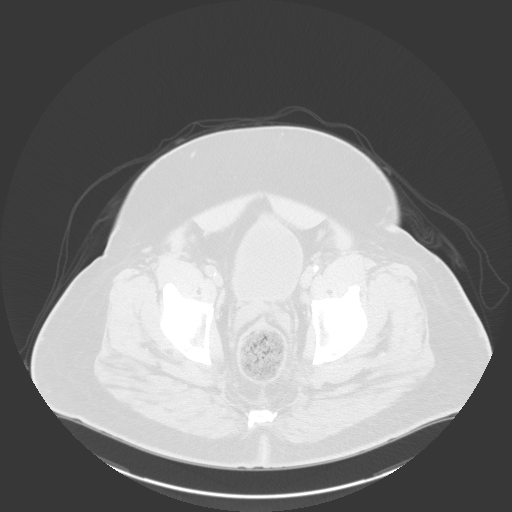
[im 34/136  lung]
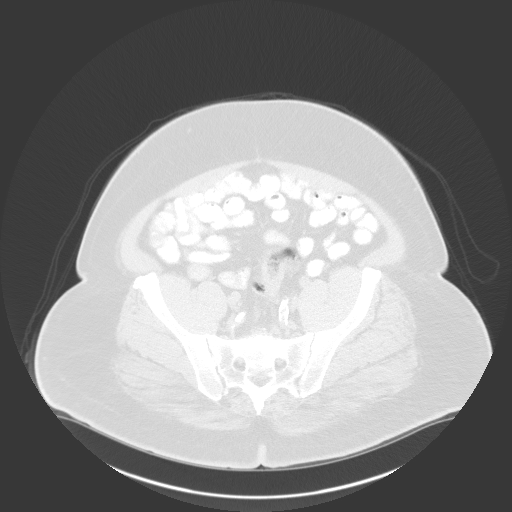
[im 46/136  lung]
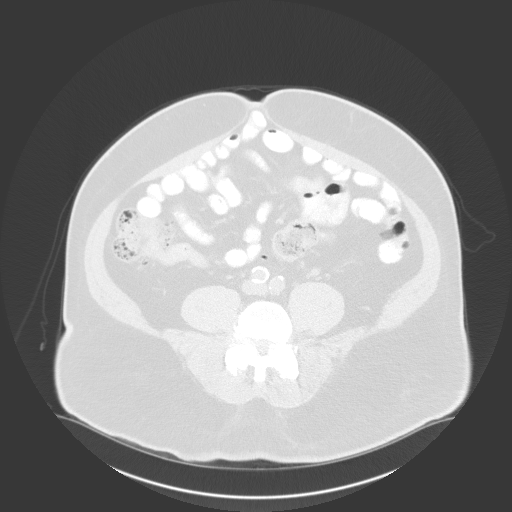
[im 57/136  mediastinal]
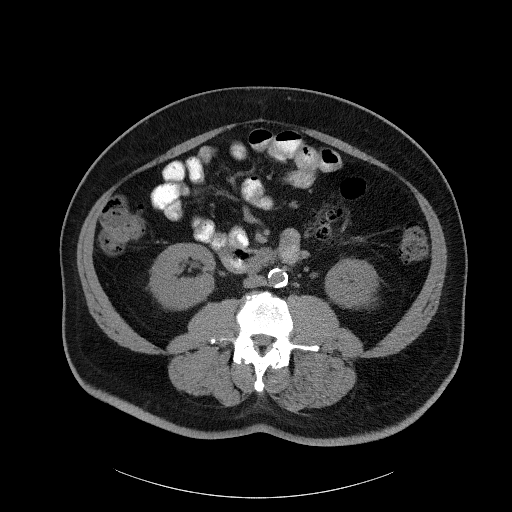
[im 57/136  lung]
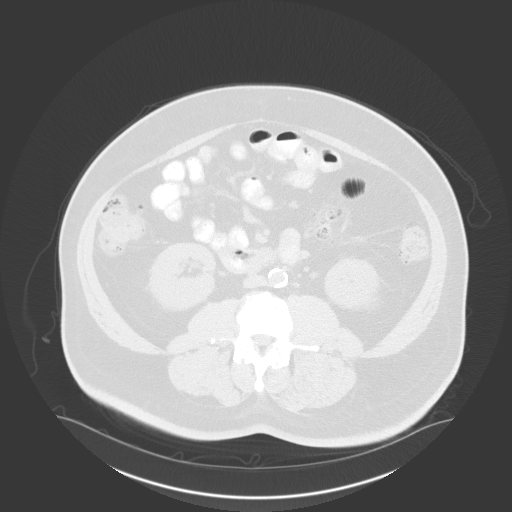
[im 79/136  lung]
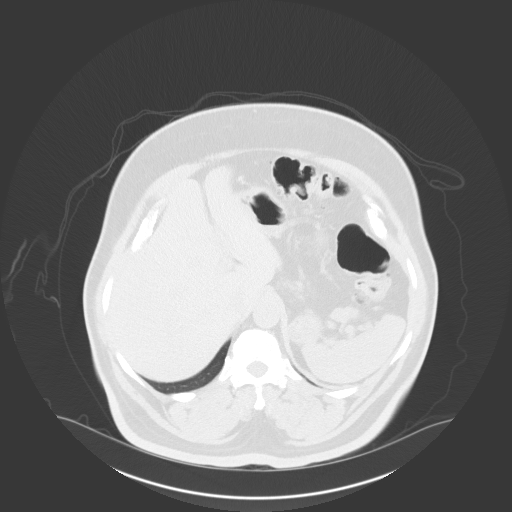
[im 91/136  lung]
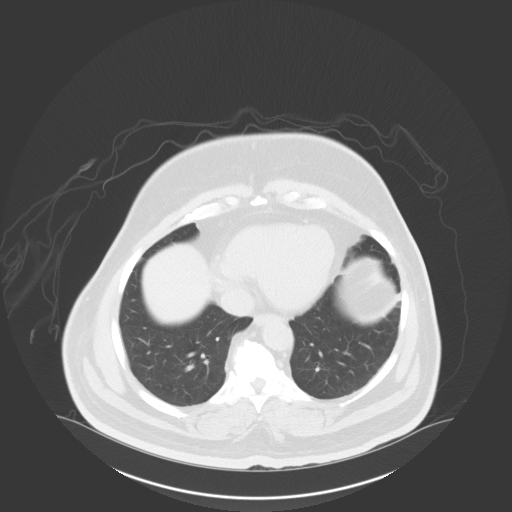
[im 102/136  lung]
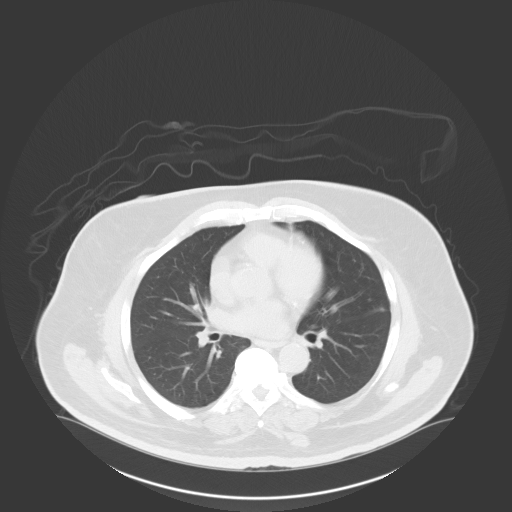
[im 113/136  mediastinal]
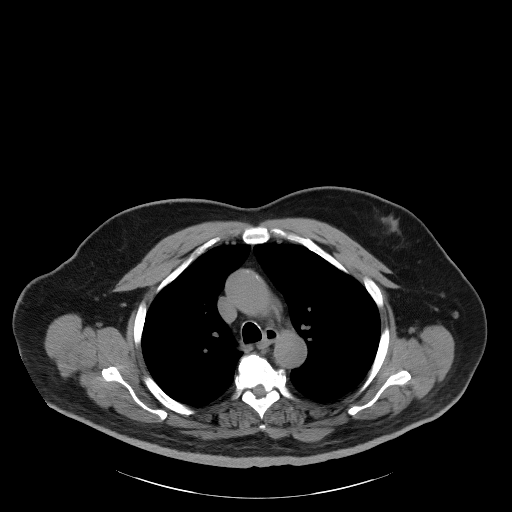
[im 113/136  lung]
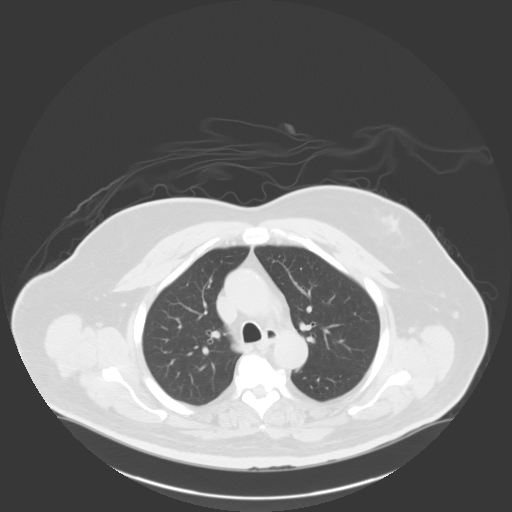
[im 124/136  lung]
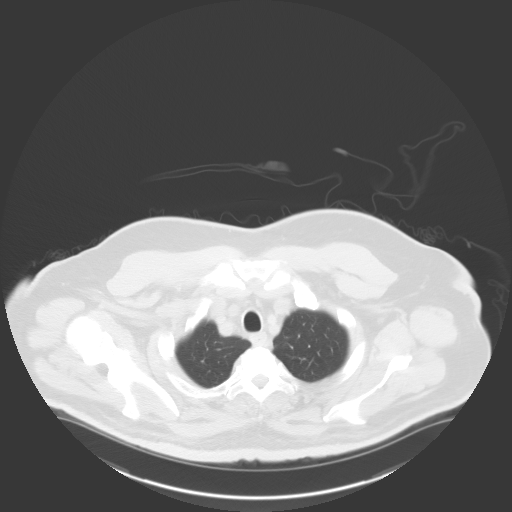

[Series 5: coronals · coronal · 0.86mm/px · 3 of 193 slices shown]
[im 39/193  lung]
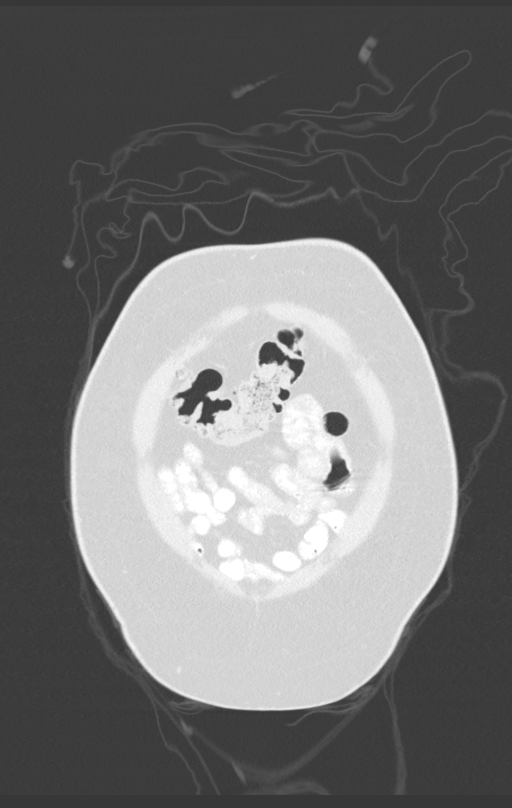
[im 77/193  lung]
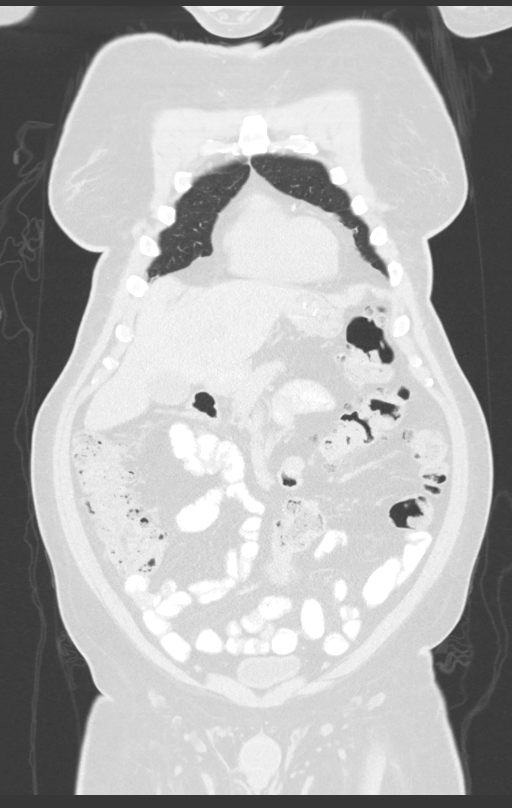
[im 116/193  lung]
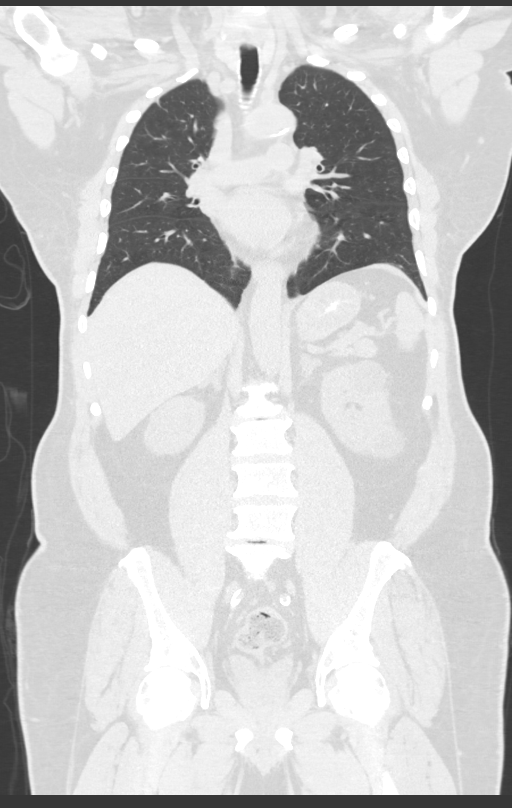

[13 of 36 positions shown; findings below may reference images not displayed]

FINDINGS: CT CHEST FINDINGS

Cardiovascular: Coronary, aortic arch, and branch vessel
atherosclerotic vascular disease.

Mediastinum/Nodes: No pathologic adenopathy in the chest.

Lungs/Pleura: Subpleural reticular density in the right upper lobe
for example on image 38/4, likely due to subsegmental atelectasis or
post inflammatory.

Linear subsegmental atelectasis or scarring in the lingula and
anteriorly in the left lower lobe.

Several small scattered isolated bulla in the lungs.

Musculoskeletal: Thoracic spondylosis with multilevel bridging
spurring.

CT ABDOMEN PELVIS FINDINGS

Hepatobiliary: Unremarkable

Pancreas: Unremarkable

Spleen: Unremarkable

Adrenals/Urinary Tract: There is thickening of the left adrenal
gland and to a lesser extent of the right adrenal gland without a
well-defined mass.

Left kidney upper pole fluid density 3.0 cm in diameter lesion on
image 65/2 favoring cyst. Left mid kidney 4.6 by 3.2 cm lesion on
image 76/2 favoring cyst. There is an additional anterior 1.5 cm
lesion in the left kidney upper pole, technically nonspecific but
probably a cyst.

2 mm right mid to lower kidney nonobstructive renal calculus, image
90/5. Urinary bladder unremarkable.

Stomach/Bowel: Scattered diverticula of the transverse colon without
active diverticulitis.

Vascular/Lymphatic: Aortoiliac atherosclerotic vascular disease.
Circumaortic left renal vein.

Reproductive: Unremarkable

Other: No supplemental non-categorized findings.

Musculoskeletal: A small hernia along the umbilicus contains a loop
of small bowel without complicating feature. Lumbar spondylosis and
degenerative disc disease causing impingement at all lumbar levels,
but most severely at L4-5. There is grade 1 degenerative
anterolisthesis at the L4-5 level.
IMPRESSION: 1. No appreciable adenopathy or bony lesions characteristic of
myeloma.
2. Other imaging findings of potential clinical significance: Aortic
Atherosclerosis ([id number]-SNL.L). Coronary atherosclerosis. Multilevel
lumbar impingement. Nonobstructive 2 mm right renal calculus.
Hypodense left renal lesions are probably cysts although enhancement
characteristics are not assessed. Small umbilical hernia is lax and
contains a loop of small bowel.

## 2019-12-05 ENCOUNTER — Ambulatory Visit: Payer: BC Managed Care – PPO | Admitting: Family Medicine

## 2020-01-19 ENCOUNTER — Other Ambulatory Visit: Payer: Self-pay | Admitting: Family Medicine

## 2020-01-19 DIAGNOSIS — E1122 Type 2 diabetes mellitus with diabetic chronic kidney disease: Secondary | ICD-10-CM

## 2020-01-19 DIAGNOSIS — I160 Hypertensive urgency: Secondary | ICD-10-CM

## 2020-01-19 DIAGNOSIS — I10 Essential (primary) hypertension: Secondary | ICD-10-CM

## 2020-01-19 NOTE — Telephone Encounter (Signed)
Requested Prescriptions  Pending Prescriptions Disp Refills   atorvastatin (LIPITOR) 10 MG tablet [Pharmacy Med Name: ATORVASTATIN 10MG  TABLETS] 90 tablet 2    Sig: TAKE 1 TABLET(10 MG) BY MOUTH DAILY     Cardiovascular:  Antilipid - Statins Failed - 01/19/2020  7:01 AM      Failed - LDL in normal range and within 360 days    LDL Chol Calc (NIH)  Date Value Ref Range Status  07/18/2019 93 0 - 99 mg/dL Final         Passed - Total Cholesterol in normal range and within 360 days    Cholesterol, Total  Date Value Ref Range Status  07/18/2019 147 100 - 199 mg/dL Final         Passed - HDL in normal range and within 360 days    HDL  Date Value Ref Range Status  07/18/2019 42 >39 mg/dL Final         Passed - Triglycerides in normal range and within 360 days    Triglycerides  Date Value Ref Range Status  07/18/2019 57 0 - 149 mg/dL Final         Passed - Patient is not pregnant      Passed - Valid encounter within last 12 months    Recent Outpatient Visits          2 months ago Peripheral edema   [facility], Ranell Patrick, MD   5 months ago Back tightness   [facility], Ranell Patrick, MD   6 months ago Type 2 diabetes mellitus with chronic kidney disease, without long-term current use of insulin, unspecified CKD stage [place])   [facility], Ranell Patrick, MD   9 months ago Type 2 diabetes mellitus with chronic kidney disease, without long-term current use of insulin, unspecified CKD stage [facility])   [facility], Ranell Patrick, MD   1 year ago Type 2 diabetes mellitus with chronic kidney disease, without long-term current use of insulin, unspecified CKD stage [facility])   [facility], Ranell Patrick, MD

## 2020-01-19 NOTE — Telephone Encounter (Signed)
Requested Prescriptions  Pending Prescriptions Disp Refills  . losartan-hydrochlorothiazide (HYZAAR) 100-25 MG tablet [Pharmacy Med Name: LOSARTAN-HCTZ 100-25 MG TAB] 90 tablet     Sig: TAKE 1 TABLET BY MOUTH EVERY DAY     Cardiovascular: ARB + Diuretic Combos Failed - 01/19/2020  9:05 AM      Failed - K in normal range and within 180 days    Potassium  Date Value Ref Range Status  07/18/2019 4.0 3.5 - 5.2 mmol/L Final         Failed - Na in normal range and within 180 days    Sodium  Date Value Ref Range Status  07/18/2019 147 (H) 134 - 144 mmol/L Final         Failed - Cr in normal range and within 180 days    Creatinine  Date Value Ref Range Status  02/15/2018 1.80 (H) 0.61 - 1.24 mg/dL Final   Creat  Date Value Ref Range Status  05/03/2016 1.11 0.70 - 1.25 mg/dL Final    Comment:      For patients > or = 68 years of age: The upper reference limit for Creatinine is approximately 13% higher for people identified as African-American.      Creatinine, Ser  Date Value Ref Range Status  07/18/2019 1.71 (H) 0.76 - 1.27 mg/dL Final         Failed - Ca in normal range and within 180 days    Calcium  Date Value Ref Range Status  07/18/2019 9.3 8.6 - 10.2 mg/dL Final         Failed - Last BP in normal range    BP Readings from Last 1 Encounters:  10/24/19 (!) 150/92         Passed - Patient is not pregnant      Passed - Valid encounter within last 6 months    Recent Outpatient Visits          2 months ago Peripheral edema   [facility], Ran[place], MD   5 months ago Back tightness   [facility], Ranell Patrick, MD   6 months ago Type 2 diabetes mellitus with chronic kidney disease, without long-term current use of insulin, unspecified CKD stage [facility])   [facility], Ranell Patrick, MD   9 months ago Type 2 diabetes mellitus with chronic kidney disease, without long-term current use of insulin, unspecified CKD stage [facility])    [facility], Ranell Patrick, MD   1 year ago Type 2 diabetes mellitus with chronic kidney disease, without long-term current use of insulin, unspecified CKD stage [facility])   [facility], Ranell [place], MD             . furosemide (LASIX) 40 MG tablet [Pharmacy Med Name: FUROSEMIDE 40 MG TABLET] 180 tablet 0    Sig: TAKE 2 TABLETS BY MOUTH EVERY DAY     Cardiovascular:  Diuretics - Loop Failed - 01/19/2020  9:05 AM      Failed - Na in normal range and within 360 days    Sodium  Date Value Ref Range Status  07/18/2019 147 (H) 134 - 144 mmol/L Final         Failed - Cr in normal range and within 360 days    Creatinine  Date Value Ref Range Status  02/15/2018 1.80 (H) 0.61 - 1.24 mg/dL Final   Creat  Date Value Ref Range  Status  05/03/2016 1.11 0.70 - 1.25 mg/dL Final    Comment:      For patients > or = 68 years of age: The upper reference limit for Creatinine is approximately 13% higher for people identified as African-American.      Creatinine, Ser  Date Value Ref Range Status  07/18/2019 1.71 (H) 0.76 - 1.27 mg/dL Final         Failed - Last BP in normal range    BP Readings from Last 1 Encounters:  10/24/19 (!) 150/92         Passed - K in normal range and within 360 days    Potassium  Date Value Ref Range Status  07/18/2019 4.0 3.5 - 5.2 mmol/L Final         Passed - Ca in normal range and within 360 days    Calcium  Date Value Ref Range Status  07/18/2019 9.3 8.6 - 10.2 mg/dL Final         Passed - Valid encounter within last 6 months    Recent Outpatient Visits          2 months ago Peripheral edema   [facility], Ranell Patrick, MD   5 months ago Back tightness   [facility], Ranell Patrick, MD   6 months ago Type 2 diabetes mellitus with chronic kidney disease, without long-term current use of insulin, unspecified CKD stage [facility])   [facility], Ranell Patrick, MD   9 months ago  Type 2 diabetes mellitus with chronic kidney disease, without long-term current use of insulin, unspecified CKD stage ([place])   [facility], Ranell Patrick, MD   1 year ago Type 2 diabetes mellitus with chronic kidney disease, without long-term current use of insulin, unspecified CKD stage ([place])   [facility], Ranell [facility], MD             . hydrALAZINE (APRESOLINE) 25 MG tablet [Pharmacy Med Name: HYDRALAZINE 25 MG TABLET] 270 tablet     Sig: TAKE 1 TABLET BY MOUTH THREE TIMES A DAY     Cardiovascular:  Vasodilators Failed - 01/19/2020  9:05 AM      Failed - HCT in normal range and within 360 days    HCT  Date Value Ref Range Status  02/15/2018 40.2 38 - 49 % Final   Hematocrit  Date Value Ref Range Status  11/21/2017 37.8 37.5 - 51.0 % Final         Failed - HGB in normal range and within 360 days    Hemoglobin  Date Value Ref Range Status  02/15/2018 13.4 13.0 - 17.1 g/dL Final  11/21/2017 13.0 13.0 - 17.7 g/dL Final         Failed - RBC in normal range and within 360 days    RBC  Date Value Ref Range Status  02/15/2018 4.39 4.20 - 5.82 MIL/uL Final         Failed - WBC in normal range and within 360 days    WBC  Date Value Ref Range Status  02/15/2018 6.0 4.0 - 10.3 K/uL Final         Failed - PLT in normal range and within 360 days    Platelets  Date Value Ref Range Status  02/15/2018 164 140 - 400 K/uL Final  11/21/2017 184 150 - 450 x10E3/uL Final   Platelet Count, POC  Date Value Ref  Range Status  05/03/2016 188 142 - 424 K/uL Final         Failed - Last BP in normal range    BP Readings from Last 1 Encounters:  10/24/19 (!) 150/92         Passed - Valid encounter within last 12 months    Recent Outpatient Visits          2 months ago Peripheral edema   [facility], Ranell Patrick, MD   5 months ago Back tightness   [facility], Ranell Patrick, MD   6 months ago Type 2 diabetes mellitus with  chronic kidney disease, without long-term current use of insulin, unspecified CKD stage [facility])   [facility], Ranell Patrick, MD   9 months ago Type 2 diabetes mellitus with chronic kidney disease, without long-term current use of insulin, unspecified CKD stage ([place])   [facility], Ranell Patrick, MD   1 year ago Type 2 diabetes mellitus with chronic kidney disease, without long-term current use of insulin, unspecified CKD stage ([place])   [facility], Ranell [facility], MD             . carvedilol (COREG) 25 MG tablet [Pharmacy Med Name: CARVEDILOL 25 MG TABLET] 180 tablet 1    Sig: TAKE 1 TABLET(25 MG) BY MOUTH TWICE DAILY WITH A MEAL     Cardiovascular:  Beta Blockers Failed - 01/19/2020  9:05 AM      Failed - Last BP in normal range    BP Readings from Last 1 Encounters:  10/24/19 (!) 150/92         Passed - Last Heart Rate in normal range    Pulse Readings from Last 1 Encounters:  10/24/19 75         Passed - Valid encounter within last 6 months    Recent Outpatient Visits          2 months ago Peripheral edema   [facility], Ranell Patrick, MD   5 months ago Back tightness   [facility], Ranell Patrick, MD   6 months ago Type 2 diabetes mellitus with chronic kidney disease, without long-term current use of insulin, unspecified CKD stage [facility])   [facility], Ranell Patrick, MD   9 months ago Type 2 diabetes mellitus with chronic kidney disease, without long-term current use of insulin, unspecified CKD stage [facility])   [facility], Ranell [facility], MD   1 year ago Type 2 diabetes mellitus with chronic kidney disease, without long-term current use of insulin, unspecified CKD stage [facility])   [facility], Ranell [facility], MD

## 2020-02-22 ENCOUNTER — Other Ambulatory Visit: Payer: Self-pay

## 2020-02-22 ENCOUNTER — Ambulatory Visit: Payer: BC Managed Care – PPO | Admitting: Family Medicine

## 2020-02-22 ENCOUNTER — Encounter: Payer: Self-pay | Admitting: Family Medicine

## 2020-02-22 VITALS — BP 150/90 | HR 76 | Temp 97.8°F | Ht 67.0 in | Wt 264.0 lb

## 2020-02-22 DIAGNOSIS — M5116 Intervertebral disc disorders with radiculopathy, lumbar region: Secondary | ICD-10-CM

## 2020-02-22 DIAGNOSIS — M792 Neuralgia and neuritis, unspecified: Secondary | ICD-10-CM

## 2020-02-22 DIAGNOSIS — E663 Overweight: Secondary | ICD-10-CM | POA: Diagnosis not present

## 2020-02-22 DIAGNOSIS — M48061 Spinal stenosis, lumbar region without neurogenic claudication: Secondary | ICD-10-CM | POA: Diagnosis not present

## 2020-02-22 MED ORDER — PREGABALIN 150 MG PO CAPS: ORAL_CAPSULE | ORAL | 5 refills | Status: DC

## 2020-02-22 NOTE — Patient Instructions (Addendum)
  Increase the Lyrica (pregabalin) to 150 mg 3 times daily.  Continue your other medications.  Referral is being made back to the [facility] Dr. Laren Everts who treated you in the past.  If you do not hear from this referral over the next 5 or 6 days call back here and ask if they have gotten any information on it.  You can also try calling the [facility] yourself and telling them that you have seen Dr. Laren Everts in the past and I am referring you back to him.  When you do go to [facility], make sure that you take a copy of your last MRI disc with you.  You may have to go to the x-ray department to get that wherever the films were done.  The you can do for your back are two: 1.Work hard on weight loss.  Since you cannot exercise a lot you need to work hard on eating less, not taking any liquid calories, reducing portion size, avoiding snacks. 2.  Try and do as much of your own physical therapy at home on a regular basis as possible based on what they taught you when you were at physical therapy.  Return as needed to see Dr. Carlota Raspberry   If you have lab work done today you will be contacted with your lab results within the next 2 weeks.  If you have not heard from [place] then please contact us. The fastest way to get your results is to register for My Chart.   IF you received an x-ray today, you will receive an invoice from [facility]. Please contact [facility] at [phone] with questions or concerns regarding your invoice.   IF you received labwork today, you will receive an invoice from [place]. Please contact [facility] at [phone] with questions or concerns regarding your invoice.   Our billing staff will not be able to assist you with questions regarding bills from these companies.  You will be contacted with the lab results as soon as they are available. The fastest way to get your results is to activate your My Chart account. Instructions are located on the last  page of this paperwork. If you have not heard from [place] regarding the results in 2 weeks, please contact this office.

## 2020-02-22 NOTE — Progress Notes (Signed)
Patient ID: [place], male    DOB: 1951/01/21  Age: 69 y.o. MRN: [id number]  Chief Complaint  Patient presents with  . Back Pain    not improving. going on for the last couple of years PT is not helping either  . 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  He is originally from [place].  For the last couple of years he been having problems progressively worse for worsening back pain.  The pain goes into his legs.  He has weakness of both legs.  He has trouble standing to teach.  He really prefers to stand.  He has a friend from his right [place].  No specific trauma.  He has been evaluated in the past with an MRI which showed multilevel disc disease.  He saw a Publishing rights manager.  He was sent to the pain clinic and received spinal injections without good relief.  He has been taking Lyrica (pregabalin) without relief.  Wants something else done.  He went to physical therapy after the injections and did not feel like that helped.  He recently told him he was not coming back, then it was not helping.  Current allergies, medications, problem list, past/family and social histories reviewed.  Objective:  BP (!) 150/90   Pulse 76   Temp 97.8 F (36.6 C) (Temporal)   Ht 5\' 7"  (1.702 m)   Wt 264 lb (119.7 kg)   SpO2 97%   BMI 41.35 kg/m   Pleasant alert gentleman.  Obese and large abdomen.  Not very tender in the paraspinous muscles or spine itself.  However if he stands and tries to flex forward he has tremendous pain.  He can extend the back without as much pain.  Side to side tilt gives some pain but not nearly as much as the anterior motion does.  Did not try and lying down to do leg raising test.  Assessment & Plan:   Assessment: 1. Neuropathic pain   2. Lumbar disc disease with radiculopathy   3. Spinal stenosis of lumbar region, unspecified whether neurogenic claudication present   4. Overweight       Plan: See instructions.  I  tried to be honest with him that there was no easy treatment of this problem, that he has multiple levels of disc disease and spinal stenosis.  He decided he wanted to try and see the doctor back at [place] who operated on his neck a few years back.  Referral will be made.  Orders Placed This Encounter  Procedures  . Ambulatory referral to Orthopedic Surgery    Referral Priority:   Routine    Referral Type:   Surgical    Referral [place]:   Specialty Services Required    Requested Specialty:   Orthopedic Surgery    Number of Visits Requested:   1    Meds ordered this encounter  Medications  . pregabalin (LYRICA) 150 MG capsule    Sig: Take 1 pill 3 times daily for nerve pain of back .    Dispense:  90 capsule    Refill:  5    This request is for a new prescription for a controlled substance as required by Federal/State law.         Patient Instructions    Increase the Lyrica (pregabalin) to 150 mg 3 times daily.  Continue your other medications.  Referral is being made back to the [facility] Laren Everts who treated you in the past.  If you do not hear from this referral over the next 5 or 6 days call back here and ask if they have gotten any information on it.  You can also try calling the [place] yourself and telling them that you have seen Dr. Laren Everts in the past and I am referring you back to him.  When you do go to [place], make sure that you take a copy of your last MRI disc with you.  You may have to go to the x-ray department to get that wherever the films were done.  The you can do for your back are two: 1.Work hard on weight loss.  Since you cannot exercise a lot you need to work hard on eating less, not taking any liquid calories, reducing portion size, avoiding snacks. 2.  Try and do as much of your own physical therapy at home on a regular basis as possible based on what they taught you when you were at physical therapy.  Return as needed to see Dr.  [place]   If you have lab work done today you will be contacted with your lab results within the next 2 weeks.  If you have not heard from [place] then please contact us. The fastest way to get your results is to register for My Chart.   IF you received an x-ray today, you will receive an invoice from [facility]. Please contact [place] at [phone] with questions or concerns regarding your invoice.   IF you received labwork today, you will receive an invoice from [place]. Please contact [place] at [phone] with questions or concerns regarding your invoice.   Our billing staff will not be able to assist you with questions regarding bills from these companies.  You will be contacted with the lab results as soon as they are available. The fastest way to get your results is to activate your My Chart account. Instructions are located on the last page of this paperwork. If you have not heard from [place] regarding the results in 2 weeks, please contact this office.         Return if symptoms worsen or fail to improve.   Jeffrey Reason, MD 02/22/2020

## 2020-04-07 ENCOUNTER — Encounter (HOSPITAL_COMMUNITY): Payer: Self-pay | Admitting: *Deleted

## 2020-04-07 ENCOUNTER — Other Ambulatory Visit: Payer: Self-pay

## 2020-04-07 ENCOUNTER — Ambulatory Visit (HOSPITAL_COMMUNITY)
Admission: EM | Admit: 2020-04-07 | Discharge: 2020-04-07 | Disposition: A | Discharge status: Home / Self Care | Payer: BC Managed Care – PPO | Attending: Internal Medicine | Admitting: Internal Medicine

## 2020-04-07 ENCOUNTER — Ambulatory Visit (INDEPENDENT_AMBULATORY_CARE_PROVIDER_SITE_OTHER): Payer: BC Managed Care – PPO

## 2020-04-07 DIAGNOSIS — K641 Second degree hemorrhoids: Secondary | ICD-10-CM | POA: Diagnosis not present

## 2020-04-07 DIAGNOSIS — Z20822 Contact with and (suspected) exposure to covid-19: Secondary | ICD-10-CM | POA: Insufficient documentation

## 2020-04-07 DIAGNOSIS — R059 Cough, unspecified: Secondary | ICD-10-CM

## 2020-04-07 LAB — SARS CORONAVIRUS 2 (TAT 6-24 HRS): SARS Coronavirus 2: NEGATIVE

## 2020-04-07 MED ORDER — POLYETHYLENE GLYCOL 3350 17 G PO PACK
17.0000 g | PACK | Freq: Every day | ORAL | 0 refills | Status: DC
Start: 2020-04-07 — End: 2021-08-31

## 2020-04-07 MED ORDER — BENZONATATE 100 MG PO CAPS: 100.0000 mg | ORAL_CAPSULE | Freq: Three times a day (TID) | ORAL | 0 refills | Status: DC

## 2020-04-07 MED ORDER — TRAMADOL HCL 50 MG PO TABS: 50.0000 mg | ORAL_TABLET | Freq: Two times a day (BID) | ORAL | 0 refills | Status: DC | PRN

## 2020-04-07 MED ORDER — LIDOCAINE 5 % EX OINT: 1.0000 "application " | TOPICAL_OINTMENT | CUTANEOUS | 0 refills | Status: DC | PRN

## 2020-04-07 NOTE — Discharge Instructions (Addendum)
Use medication as prescribed Return if your symptoms worsen or does not improve Increase fiber/fruit/vegetable  intake.

## 2020-04-07 NOTE — ED Triage Notes (Signed)
Patient in with complaints of dry cough, body aches and runny nose x 1 week. Patient has received COVID vaccinations. Patient also has complaints of hemorrhoid pain x 2 weeks. Patient has taken Tylenol and Preparation H with no relief. Patient denies fever. Patient concerned that he has the Flu.

## 2020-04-07 NOTE — ED Provider Notes (Signed)
[facility]    CSN: [id number] Arrival date & time: 04/07/20  1024      History   Chief Complaint Chief Complaint  Patient presents with  . Cough  . Nasal Congestion  . Hemorrhoids    HPI Jeffrey Parks is a 69 y.o. male comes to urgent care with complaints of nonproductive cough of 2 weeks duration.  Patient says that the cough is mainly worse at night.  Is nonproductive and not associated with any shortness of breath, fever, chills, chest pain, nausea or vomiting.  Cough is associated with some body aches and runny nose.  Patient is fully vaccinated and got his booster shot as well.  Patient denies any wheezing.  No history of seasonal allergies.  No tobacco use.  Patient also complains of hemorrhoidal pain which has been ongoing for the past week to 2 weeks.  Patient has Anusol suppository but has not used that because of pain.  He has been using the Anusol cream with no significant improvement.  He continues to have firm stools.  He has not been taking any stool softeners.  No abdominal pain or distention  HPI  Past Medical History:  Diagnosis Date  . Hypertension   . Low testosterone   . Sickle cell trait [facility])     Patient Active Problem List   Diagnosis Date Noted  . Chronic kidney disease, stage III (moderate) ([place]) 02/08/2018  . Stenosis of cervical spine with myelopathy ([place]) 06/22/2017  . Oropharyngeal dysphagia 06/22/2017  . Type 2 diabetes mellitus with complication ([place]) 09/38/1829  . Gait instability 01/14/2017  . 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 Cervicalgia 01/14/2017  . Leg swelling 09/08/2016  . Sleep apnea 09/08/2016  . Dizziness 09/08/2016  . Fatigue 05/30/2015  . Hypogonadism male 05/22/2015  . Morbid obesity due to excess calories ([place]) 05/22/2015  . 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 Essential hypertension 05/07/2015  . Erectile dysfunction 05/07/2015    Past Surgical History:  Procedure Laterality Date  . neck surgery, Dec.  14,2018    . None         Home Medications    Prior to Admission medications   Medication Sig Start Date End Date Taking? Authorizing Provider  acetaminophen (TYLENOL ARTHRITIS PAIN) 650 MG CR tablet Take 1 tablet (650 mg total) by mouth every 6 (six) hours as needed for pain. 06/09/16  Yes Plunkett, Loree Fee, MD  atorvastatin (LIPITOR) 10 MG tablet TAKE 1 TABLET(10 MG) BY MOUTH DAILY 01/19/20  Yes Wendie Agreste, MD  bisacodyl (DULCOLAX) 5 MG EC tablet Take 1 tablet (5 mg total) by mouth daily as needed for moderate constipation. 12/02/16  Yes McVey, Gelene Mink, PA-C  furosemide (LASIX) 40 MG tablet TAKE 2 TABLETS BY MOUTH EVERY DAY 01/19/20  Yes Wendie Agreste, MD  hydrALAZINE (APRESOLINE) 25 MG tablet Take 1 tablet (25 mg total) by mouth 3 (three) times daily. 10/24/19  Yes Wendie Agreste, MD  losartan-hydrochlorothiazide (HYZAAR) 100-25 MG tablet Take 1 tablet by mouth daily. 10/24/19  Yes Wendie Agreste, MD  metFORMIN (GLUCOPHAGE) 500 MG tablet Take 1 tablet (500 mg total) by mouth 2 (two) times daily with a meal. 10/24/19  Yes Wendie Agreste, MD  oxyCODONE (OXY IR/ROXICODONE) 5 MG immediate release tablet Take by mouth. 06/15/17  Yes [provider]  pregabalin (LYRICA) 150 MG capsule Take 1 pill 3 times daily for nerve pain of back . 02/22/20  Yes Posey Boyer, MD  Sennosides-Docusate Sodium (SENNA-DOCUSATE SODIUM  PO) Take by mouth.   Yes [provider]  benzonatate (TESSALON) 100 MG capsule Take 1 capsule (100 mg total) by mouth every 8 (eight) hours. 04/07/20   Chase Picket, MD  carvedilol (COREG) 25 MG tablet TAKE 1 TABLET(25 MG) BY MOUTH TWICE DAILY WITH A MEAL 01/19/20   Wendie Agreste, MD  hydrALAZINE (APRESOLINE) 50 MG tablet Take 50 mg by mouth 3 (three) times daily. 01/24/20   [provider]  lidocaine (XYLOCAINE) 5 % ointment Apply 1 application topically as needed. 04/07/20   Cannen Dupras, Myrene Galas, MD  polyethylene glycol (MIRALAX  / GLYCOLAX) 17 g packet Take 17 g by mouth daily. 04/07/20   Camilah Spillman, Myrene Galas, MD  traMADol (ULTRAM) 50 MG tablet Take 1 tablet (50 mg total) by mouth every 12 (twelve) hours as needed for moderate pain. 04/07/20   LampteyMyrene Galas, MD    Family History Family History  Problem Relation Age of Onset  . Hypertension Mother   . Diabetes Other   . Sickle cell anemia Son   . Sickle cell anemia Son     Social History Social History   Tobacco Use  . Smoking status: Former Smoker    Types: Cigarettes  . Smokeless tobacco: Never Used  . Tobacco comment: Quit two years ago.  Light smoker prior.   Vaping Use  . Vaping Use: Never used  Substance Use Topics  . Alcohol use: Yes    Alcohol/week: 3.0 standard drinks    Types: 3 Standard drinks or equivalent per week  . Drug use: No     Allergies   Norvasc [amlodipine]   Review of Systems Review of Systems  Constitutional: Negative.   HENT: Negative.   Respiratory: Negative.   Gastrointestinal: Negative for abdominal pain, diarrhea, nausea and vomiting.  Genitourinary: Negative.  Negative for dysuria, genital sores and hematuria.  Musculoskeletal: Negative.      Physical Exam Triage Vital Signs ED Triage Vitals  Enc Vitals Group     BP 04/07/20 1209 (!) 167/88     Pulse Rate 04/07/20 1209 69     Resp 04/07/20 1209 18     Temp 04/07/20 1209 98.3 F (36.8 C)     Temp Source 04/07/20 1209 Oral     SpO2 04/07/20 1209 99 %     Weight --      Height --      Head Circumference --      Peak Flow --      Pain Score 04/07/20 1214 8     Pain Loc --      Pain Edu? --      Excl. in [place]? --    No data found.  Updated Vital Signs BP (!) 167/88 (BP Location: Left Arm)   Pulse 69   Temp 98.3 F (36.8 C) (Oral)   Resp 18   SpO2 99%   Visual Acuity Right Eye Distance:   Left Eye Distance:   Bilateral Distance:    Right Eye Near:   Left Eye Near:    Bilateral Near:     Physical Exam Vitals and nursing note reviewed.    Constitutional:      General: He is not in acute distress.    Appearance: He is not ill-appearing or diaphoretic.  Cardiovascular:     Rate and Rhythm: Normal rate and regular rhythm.     Pulses: Normal pulses.     Heart sounds: Normal heart sounds.  Pulmonary:  Effort: Pulmonary effort is normal.     Breath sounds: Normal breath sounds.  Abdominal:     General: Bowel sounds are normal.     Palpations: Abdomen is soft.  Skin:    General: Skin is warm.     Capillary Refill: Capillary refill takes less than 2 seconds.  Neurological:     Mental Status: He is alert.      UC Treatments / Results  Labs (all labs ordered are listed, but only abnormal results are displayed) Labs Reviewed  SARS CORONAVIRUS 2 (TAT 6-24 HRS)    EKG   Radiology DG Chest 2 View  Result Date: 04/07/2020 CLINICAL DATA:  Cough for 2 weeks EXAM: CHEST - 2 VIEW COMPARISON:  05/07/2015 FINDINGS: The heart size and mediastinal contours are within normal limits. No focal airspace consolidation, pleural effusion, or pneumothorax. Degenerative changes within the thoracic spine. IMPRESSION: No active cardiopulmonary disease. Electronically Signed   By: Davina Poke D.O.   On: 04/07/2020 13:59    Procedures Procedures (including critical care time)  Medications Ordered in UC Medications - No data to display  Initial Impression / Assessment and Plan / UC Course  I have reviewed the triage vital signs and the nursing notes.  Pertinent labs & imaging results that were available during my care of the patient were reviewed by me and considered in my medical decision making (see chart for details).    1.  Persistent cough: Continue bronchodilator treatment Chest x-ray is negative for acute lung infiltrate No indication for antibiotics COVID-19 PCR test sent Patient is advised to quarantine until COVID-19 test results are available.  2.  Hemorrhoidal pain: Lidocaine cream as needed Patient  encouraged to use Anusol suppositories to help with symptoms If symptoms persist or there is no improvement, they may need to see a proctologist for further management. Return precautions given. Final Clinical Impressions(s) / UC Diagnoses   Final diagnoses:  Grade II hemorrhoids  Cough     Discharge Instructions     Use medication as prescribed Return if your symptoms worsen or does not improve Increase fiber/fruit/vegetable  intake.   ED Prescriptions    Medication Sig Dispense Auth. Provider   polyethylene glycol (MIRALAX / GLYCOLAX) 17 g packet Take 17 g by mouth daily. 28 each Clairissa Valvano, Myrene Galas, MD   benzonatate (TESSALON) 100 MG capsule Take 1 capsule (100 mg total) by mouth every 8 (eight) hours. 21 capsule Brownell Paone, Myrene Galas, MD   lidocaine (XYLOCAINE) 5 % ointment Apply 1 application topically as needed. 35.44 g Chase Picket, MD   traMADol (ULTRAM) 50 MG tablet Take 1 tablet (50 mg total) by mouth every 12 (twelve) hours as needed for moderate pain. 10 tablet Medard Decuir, Myrene Galas, MD     I have reviewed the PDMP during this encounter.   Chase Picket, MD 04/07/20 [phone]

## 2020-08-24 ENCOUNTER — Other Ambulatory Visit: Payer: Self-pay | Admitting: Family Medicine

## 2020-08-24 DIAGNOSIS — I10 Essential (primary) hypertension: Secondary | ICD-10-CM

## 2020-08-24 NOTE — Telephone Encounter (Signed)
Requested medication (s) are due for refill today: yes  Requested medication (s) are on the active medication list: yes  Last refill:  10/24/19 #90 1 RF  Future visit scheduled: no  Notes to clinic:  needs appt   Requested Prescriptions  Pending Prescriptions Disp Refills   losartan-hydrochlorothiazide (HYZAAR) 100-25 MG tablet [Pharmacy Med Name: LOSARTAN/HCTZ 100/25MG  TABLETS] 90 tablet 1    Sig: Take 1 tablet by mouth daily.      Cardiovascular: ARB + Diuretic Combos Failed - 08/24/2020 12:40 PM      Failed - K in normal range and within 180 days    Potassium  Date Value Ref Range Status  07/18/2019 4.0 3.5 - 5.2 mmol/L Final          Failed - Na in normal range and within 180 days    Sodium  Date Value Ref Range Status  07/18/2019 147 (H) 134 - 144 mmol/L Final          Failed - Cr in normal range and within 180 days    Creatinine  Date Value Ref Range Status  02/15/2018 1.80 (H) 0.61 - 1.24 mg/dL Final   Creat  Date Value Ref Range Status  05/03/2016 1.11 0.70 - 1.25 mg/dL Final    Comment:      For patients > or = 69 years of age: The upper reference limit for Creatinine is approximately 13% higher for people identified as African-American.      Creatinine, Ser  Date Value Ref Range Status  07/18/2019 1.71 (H) 0.76 - 1.27 mg/dL Final          Failed - Ca in normal range and within 180 days    Calcium  Date Value Ref Range Status  07/18/2019 9.3 8.6 - 10.2 mg/dL Final          Failed - Last BP in normal range    BP Readings from Last 1 Encounters:  04/07/20 (!) 167/88          Failed - Valid encounter within last 6 months    Recent Outpatient Visits           6 months ago Neuropathic pain   [facility], Fenton Malling, MD   10 months ago Peripheral edema   [facility] at [facility], Ranell Patrick, MD   1 year ago Back tightness   [facility] at [facility], Ranell Patrick, MD   1 year ago Type 2 diabetes mellitus with chronic  kidney disease, without long-term current use of insulin, unspecified CKD stage [facility])   [facility] at [facility], Ranell Patrick, MD   1 year ago Type 2 diabetes mellitus with chronic kidney disease, without long-term current use of insulin, unspecified CKD stage [facility])   [facility] at [facility], Ranell Patrick, MD                Passed - Patient is not pregnant

## 2020-08-25 NOTE — Telephone Encounter (Signed)
Spoke with patient and scheduled a follow up appt for Wed 3/16 at 8am.

## 2020-08-25 NOTE — Telephone Encounter (Signed)
Please schedule appt for med refill and f/u 30 day supply has been sent

## 2020-08-27 ENCOUNTER — Ambulatory Visit: Payer: BC Managed Care – PPO | Admitting: Family Medicine

## 2020-08-27 ENCOUNTER — Encounter: Payer: Self-pay | Admitting: Family Medicine

## 2020-08-27 ENCOUNTER — Other Ambulatory Visit: Payer: Self-pay

## 2020-08-27 VITALS — BP 150/92 | HR 88 | Temp 98.6°F | Ht 67.0 in | Wt 263.0 lb

## 2020-08-27 DIAGNOSIS — I160 Hypertensive urgency: Secondary | ICD-10-CM

## 2020-08-27 DIAGNOSIS — R609 Edema, unspecified: Secondary | ICD-10-CM

## 2020-08-27 DIAGNOSIS — K649 Unspecified hemorrhoids: Secondary | ICD-10-CM

## 2020-08-27 DIAGNOSIS — E1122 Type 2 diabetes mellitus with diabetic chronic kidney disease: Secondary | ICD-10-CM | POA: Diagnosis not present

## 2020-08-27 DIAGNOSIS — Z23 Encounter for immunization: Secondary | ICD-10-CM

## 2020-08-27 DIAGNOSIS — I10 Essential (primary) hypertension: Secondary | ICD-10-CM

## 2020-08-27 MED ORDER — FUROSEMIDE 40 MG PO TABS: 80.0000 mg | ORAL_TABLET | Freq: Every day | ORAL | 1 refills | Status: DC

## 2020-08-27 MED ORDER — ATORVASTATIN CALCIUM 10 MG PO TABS: ORAL_TABLET | ORAL | 2 refills | Status: DC

## 2020-08-27 MED ORDER — HYDROCORTISONE ACETATE 25 MG RE SUPP: 25.0000 mg | Freq: Two times a day (BID) | RECTAL | 1 refills | Status: DC | PRN

## 2020-08-27 MED ORDER — HYDRALAZINE HCL 50 MG PO TABS: 50.0000 mg | ORAL_TABLET | Freq: Three times a day (TID) | ORAL | 1 refills | Status: DC

## 2020-08-27 MED ORDER — METFORMIN HCL 500 MG PO TABS: 500.0000 mg | ORAL_TABLET | Freq: Two times a day (BID) | ORAL | 3 refills | Status: DC

## 2020-08-27 MED ORDER — CARVEDILOL 3.125 MG PO TABS: 3.1250 mg | ORAL_TABLET | Freq: Two times a day (BID) | ORAL | 3 refills | Status: DC

## 2020-08-27 NOTE — Patient Instructions (Addendum)
Start lower dose carvedilol, no other med changes. If fatigue returns, stop that med and be seen right away.Keep a record of your blood pressures outside of the office and bring them to the next office visit.  anusol [facility] for now for hemorrhoids,See info on constipation. That can worsen hemorrhoids. Miralax once per day if needed for softer stools for now. Return to the clinic or go to the nearest emergency room if any of your symptoms worsen or new symptoms occur. If not better in next 2 weeks, I recommend being seen.    My new office:  [facility] Address: [address] [place], [place], [place] [address] Phone: [phone]    If you have lab work done today you will be contacted with your lab results within the next 2 weeks.  If you have not heard from [place] then please contact us. The fastest way to get your results is to register for My Chart.   Hemorrhoids Hemorrhoids are swollen veins that may develop:  In the butt (rectum). These are called internal hemorrhoids.  Around the opening of the butt (anus). These are called external hemorrhoids. Hemorrhoids can cause pain, itching, or bleeding. Most of the time, they do not cause serious problems. They usually get better with diet changes, lifestyle changes, and other home treatments. What are the causes? This condition may be caused by:  Having trouble pooping (constipation).  Pushing hard (straining) to poop.  Watery poop (diarrhea).  Pregnancy.  Being very overweight (obese).  Sitting for long periods of time.  Heavy lifting or other activity that causes you to strain.  Anal sex.  Riding a bike for a long period of time. What are the signs or symptoms? Symptoms of this condition include:  Pain.  Itching or soreness in the butt.  Bleeding from the butt.  Leaking poop.  Swelling in the area.  One or more lumps around the opening of your butt. How is this diagnosed? A doctor can often diagnose this  condition by looking at the affected area. The doctor may also:  Do an exam that involves feeling the area with a gloved hand (digital rectal exam).  Examine the area inside your butt using a small tube (anoscope).  Order blood tests. This may be done if you have lost a lot of blood.  Have you get a test that involves looking inside the colon using a flexible tube with a camera on the end (sigmoidoscopy or colonoscopy). How is this treated? This condition can usually be treated at home. Your doctor may tell you to change what you eat, make lifestyle changes, or try home treatments. If these do not help, procedures can be done to remove the hemorrhoids or make them smaller. These may involve:  Placing rubber bands at the base of the hemorrhoids to cut off their blood supply.  Injecting medicine into the hemorrhoids to shrink them.  Shining a type of light energy onto the hemorrhoids to cause them to fall off.  Doing surgery to remove the hemorrhoids or cut off their blood supply. Follow these instructions at home: Eating and drinking  Eat foods that have a lot of fiber in them. These include whole grains, beans, nuts, fruits, and vegetables.  Ask your doctor about taking products that have added fiber (fibersupplements).  Reduce the amount of fat in your diet. You can do this by: ? Eating low-fat dairy products. ? Eating less red meat. ? Avoiding processed foods.  Drink enough fluid  to keep your pee (urine) pale yellow.   Managing pain and swelling  Take a warm-water bath (sitz bath) for 20 minutes to ease pain. Do this 3-4 times a day. You may do this in a bathtub or using a portable sitz bath that fits over the toilet.  If told, put ice on the painful area. It may be helpful to use ice between your warm baths. ? Put ice in a plastic bag. ? Place a towel between your skin and the bag. ? Leave the ice on for 20 minutes, 2-3 times a day.   General instructions  Take  over-the-counter and prescription medicines only as told by your doctor. ? Medicated creams and medicines may be used as told.  Exercise often. Ask your doctor how much and what kind of exercise is best for you.  Go to the bathroom when you have the urge to poop. Do not wait.  Avoid pushing too hard when you poop.  Keep your butt dry and clean. Use wet toilet paper or moist towelettes after pooping.  Do not sit on the toilet for a long time.  Keep all follow-up visits as told by your doctor. This is important. Contact a doctor if you:  Have pain and swelling that do not get better with treatment or medicine.  Have trouble pooping.  Cannot poop.  Have pain or swelling outside the area of the hemorrhoids. Get help right away if you have:  Bleeding that will not stop. Summary  Hemorrhoids are swollen veins in the butt or around the opening of the butt.  They can cause pain, itching, or bleeding.  Eat foods that have a lot of fiber in them. These include whole grains, beans, nuts, fruits, and vegetables.  Take a warm-water bath (sitz bath) for 20 minutes to ease pain. Do this 3-4 times a day. This information is not intended to replace advice given to you by your health care provider. Make sure you discuss any questions you have with your health care provider. Document Revised: 06/08/2018 Document Reviewed: 10/20/2017 Elsevier Patient Education  2021 [place].   Constipation, Adult Constipation is when a person has trouble pooping (having a bowel movement). When you have this condition, you may poop fewer than 3 times a week. Your poop (stool) may also be dry, hard, or bigger than normal. Follow these instructions at home: Eating and drinking  Eat foods that have a lot of fiber, such as: ? Fresh fruits and vegetables. ? Whole grains. ? Beans.  Eat less of foods that are low in fiber and high in fat and sugar, such as: ? [place]  fries. ? Hamburgers. ? Cookies. ? Candy. ? Soda.  Drink enough fluid to keep your pee (urine) pale yellow.   General instructions  Exercise regularly or as told by your doctor. Try to do 150 minutes of exercise each week.  Go to the restroom when you feel like you need to poop. Do not hold it in.  Take over-the-counter and prescription medicines only as told by your doctor. These include any fiber supplements.  When you poop: ? Do deep breathing while relaxing your lower belly (abdomen). ? Relax your pelvic floor. The pelvic floor is a group of muscles that support the rectum, bladder, and intestines (as well as the uterus in women).  Watch your condition for any changes. Tell your doctor if you notice any.  Keep all follow-up visits as told by your doctor. This is important. Contact a  doctor if:  You have pain that gets worse.  You have a fever.  You have not pooped for 4 days.  You vomit.  You are not hungry.  You lose weight.  You are bleeding from the opening of the butt (anus).  You have thin, pencil-like poop. Get help right away if:  You have a fever, and your symptoms suddenly get worse.  You leak poop or have blood in your poop.  Your belly feels hard or bigger than normal (bloated).  You have very bad belly pain.  You feel dizzy or you faint. Summary  Constipation is when a person poops fewer than 3 times a week, has trouble pooping, or has poop that is dry, hard, or bigger than normal.  Eat foods that have a lot of fiber.  Drink enough fluid to keep your pee (urine) pale yellow.  Take over-the-counter and prescription medicines only as told by your doctor. These include any fiber supplements. This information is not intended to replace advice given to you by your health care provider. Make sure you discuss any questions you have with your health care provider. Document Revised: 04/18/2019 Document Reviewed: 04/18/2019 Elsevier Patient Education   2021 [place].   IF you received an x-ray today, you will receive an invoice from [facility]. Please contact [facility] at [phone] with questions or concerns regarding your invoice.   IF you received labwork today, you will receive an invoice from [facility]. Please contact [facility] at [phone] with questions or concerns regarding your invoice.   Our billing staff will not be able to assist you with questions regarding bills from these companies.  You will be contacted with the lab results as soon as they are available. The fastest way to get your results is to activate your My Chart account. Instructions are located on the last page of this paperwork. If you have not heard from [place] regarding the results in 2 weeks, please contact this office.

## 2020-08-27 NOTE — Progress Notes (Signed)
Subjective:  Patient ID: Jeffrey Parks, male    DOB: [birth year]  Age: 70 y.o. MRN: [id number]  CC:  Chief Complaint  Patient presents with  . Medication Refill    Pt reports he needs refills on his chronic medication. Pt states most of his medication works well for him other than when he takes his Carvedilol pt states this medication makes him feel extremely weak.  . Rectal Pain    Pt reports pain in his rectal area. Pt is using preporation H and tylenol it helps a little not much.    HPI Jeffrey Parks presents for  Follow up.  Last seen by me in May 2021.  [facility] for chronic low back pain, neck pain.  Previously treated by pain management, has received spinal injections, treated with Lyrica in the past.  Was seen by Dr. Linna Darner in September.  Referred to [facility] at that time.  Diabetes: Complicated by CKD, has been followed by nephrology.  Is on ARB, Metformin 500 mg twice daily.  Recommended starting Lipitor at his May 2021 visit.  6-week follow-up recommended at that time. Taking lipitor daily.  Home readings: Fasting: 114 range. No sx lows or 200's.  Microalbumin: Normal ratio July 2020 Optho, foot exam, pneumovax: Reports seeing optho last week.  prevnar today.  Diabetic Foot Exam - Simple   No data filed     Immunization History  Administered Date(s) Administered  . Fluad Quad(high Dose 65+) 04/11/2019  . Moderna Sars-Covid-2 Vaccination 07/26/2019, 08/28/2019  . Pneumococcal Polysaccharide-23 04/11/2019     Lab Results  Component Value Date   HGBA1C 6.5 (H) 07/18/2019   HGBA1C 6.6 (H) 01/01/2019   HGBA1C 6.5 (A) 04/21/2018   Lab Results  Component Value Date   LDLCALC 93 07/18/2019   CREATININE 1.71 (H) 07/18/2019   Hypertension: Carvedilol 25 mg twice daily, hydralazine 50 mg 3 times daily (note from nephrology in April 2021 increased hydralazine to 50 mg), losartan HCTZ 100/25 mg daily, Lasix 80 mg total per day. Feels tired  with carvedilol - notes after taking med past few months. Stopped taking past 2 weeks.  Home readings: 127/70 past few days. Better than here.  BP Readings from Last 3 Encounters:  08/27/20 (!) 150/92  04/07/20 (!) 167/88  02/22/20 (!) 150/90   Lab Results  Component Value Date   CREATININE 1.71 (H) 07/18/2019    Pedal edema Recommended compression stockings at his May visit, was continued on Lasix 80 mg daily. Using compression stockings, not wearing today.  Swelling stable with fluid pill.   Rectal/perianal pain: Noted 1 month ago. Off and on. Some hemorrhoids.  Some constipation/hard stools at times.  No bleeding.  Last BM yesterday - hard stool. BM daily with laxative - senokot? Takes as needed.  Has been using Tylenol, Preparation H with some relief - 1-2 times per day  History Patient Active Problem List   Diagnosis Date Noted  . Chronic kidney disease, stage III (moderate) ([place]) 02/08/2018  . Stenosis of cervical spine with myelopathy ([place]) 06/22/2017  . Oropharyngeal dysphagia 06/22/2017  . Type 2 diabetes mellitus with complication ([place]) 87/56/4332  . Gait instability 01/14/2017  . 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 Cervicalgia 01/14/2017  . Leg swelling 09/08/2016  . Sleep apnea 09/08/2016  . Dizziness 09/08/2016  . Fatigue 05/30/2015  . Hypogonadism male 05/22/2015  . Morbid obesity due to excess calories ([place]) 05/22/2015  . 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 Essential hypertension 05/07/2015  .  Erectile dysfunction 05/07/2015   Past Medical History:  Diagnosis Date  . Hypertension   . Low testosterone   . Sickle cell trait [facility])    Past Surgical History:  Procedure Laterality Date  . neck surgery, Dec. 14,2018    . None     Allergies  Allergen Reactions  . Norvasc [Amlodipine]    Prior to Admission medications   Medication Sig Start Date End Date Taking? Authorizing Provider  acetaminophen (TYLENOL ARTHRITIS PAIN) 650 MG CR tablet Take 1 tablet  (650 mg total) by mouth every 6 (six) hours as needed for pain. 06/09/16   Blanchie Dessert, MD  atorvastatin (LIPITOR) 10 MG tablet TAKE 1 TABLET(10 MG) BY MOUTH DAILY 01/19/20   Wendie Agreste, MD  benzonatate (TESSALON) 100 MG capsule Take 1 capsule (100 mg total) by mouth every 8 (eight) hours. 04/07/20   Lamptey, Myrene Galas, MD  bisacodyl (DULCOLAX) 5 MG EC tablet Take 1 tablet (5 mg total) by mouth daily as needed for moderate constipation. 12/02/16   McVey, Gelene Mink, PA-C  carvedilol (COREG) 25 MG tablet TAKE 1 TABLET(25 MG) BY MOUTH TWICE DAILY WITH A MEAL 01/19/20   Wendie Agreste, MD  furosemide (LASIX) 40 MG tablet TAKE 2 TABLETS BY MOUTH EVERY DAY 01/19/20   Wendie Agreste, MD  hydrALAZINE (APRESOLINE) 25 MG tablet Take 1 tablet (25 mg total) by mouth 3 (three) times daily. 10/24/19   Wendie Agreste, MD  hydrALAZINE (APRESOLINE) 50 MG tablet Take 50 mg by mouth 3 (three) times daily. 01/24/20   [provider]  lidocaine (XYLOCAINE) 5 % ointment Apply 1 application topically as needed. 04/07/20   Chase Picket, MD  losartan-hydrochlorothiazide (HYZAAR) 100-25 MG tablet TAKE 1 TABLET BY MOUTH DAILY 08/25/20   Wendie Agreste, MD  metFORMIN (GLUCOPHAGE) 500 MG tablet Take 1 tablet (500 mg total) by mouth 2 (two) times daily with a meal. 10/24/19   Wendie Agreste, MD  oxyCODONE (OXY IR/ROXICODONE) 5 MG immediate release tablet Take by mouth. 06/15/17   [provider]  polyethylene glycol (MIRALAX / GLYCOLAX) 17 g packet Take 17 g by mouth daily. 04/07/20   Lamptey, Myrene Galas, MD  pregabalin (LYRICA) 150 MG capsule Take 1 pill 3 times daily for nerve pain of back . 02/22/20   Posey Boyer, MD  Sennosides-Docusate Sodium (SENNA-DOCUSATE SODIUM PO) Take by mouth.    [provider]  traMADol (ULTRAM) 50 MG tablet Take 1 tablet (50 mg total) by mouth every 12 (twelve) hours as needed for moderate pain. 04/07/20   LampteyMyrene Galas, MD   Social  History   Socioeconomic History  . Marital status: Single    Spouse name: Not on file  . Number of children: Not on file  . Years of education: Not on file  . Highest education level: Not on file  Occupational History  . Not on file  Tobacco Use  . Smoking status: Former Smoker    Types: Cigarettes  . Smokeless tobacco: Never Used  . Tobacco comment: Quit two years ago.  Light smoker prior.   Vaping Use  . Vaping Use: Never used  Substance and Sexual Activity  . Alcohol use: Yes    Alcohol/week: 3.0 standard drinks    Types: 3 Standard drinks or equivalent per week  . Drug use: No  . Sexual activity: Not on file  Other Topics Concern  . Not on file  Social History Narrative   Lives  with wife, 2 adult children and 3 grandchildren   Has 2 deceased children - 34 children total   Has PhD in literature   Professor at [employer]   Social Determinants of Radio broadcast assistant Strain: Not on [employer] Insecurity: Not on file  Transportation Needs: Not on file  Physical Activity: Not on file  Stress: Not on file  Social Connections: Not on file  Intimate Partner Violence: Not on file    Review of Systems  Constitutional: Negative for fatigue and unexpected weight change.  Eyes: Negative for visual disturbance.  Respiratory: Negative for cough, chest tightness and shortness of breath.   Cardiovascular: Negative for chest pain, palpitations and leg swelling.  Gastrointestinal: Negative for abdominal pain and blood in stool.  Neurological: Negative for dizziness, light-headedness and headaches.     Objective:   Vitals:   08/27/20 0813 08/27/20 0815 08/27/20 0817  BP: (!) 169/101 (!) 148/90 (!) 150/92  Pulse: 88    Temp: 98.6 F (37 C)    TempSrc: Temporal    SpO2: 96%    Weight: 263 lb (119.3 kg)    Height: 5\' 7"  (1.702 m)       Physical Exam Vitals reviewed.  Constitutional:      Appearance: He is well-developed. He is obese.  HENT:     Head:  Normocephalic and atraumatic.  Eyes:     Pupils: Pupils are equal, round, and reactive to light.  Neck:     Vascular: No carotid bruit or JVD.  Cardiovascular:     Rate and Rhythm: Normal rate and regular rhythm.     Heart sounds: Normal heart sounds. No murmur heard.   Pulmonary:     Effort: Pulmonary effort is normal.     Breath sounds: Normal breath sounds. No rales.  Genitourinary:    Comments: Multiple nonthrombosed external hemorrhoids, no active bleeding, no apparent fissure.  Musculoskeletal:     Right lower leg: Edema (1-2+ pitting LE bilateral. ) present.     Left lower leg: Edema present.  Skin:    General: Skin is warm and dry.  Neurological:     Mental Status: He is alert and oriented to person, place, and time.  Psychiatric:        Mood and Affect: Mood normal.    45 minutes spent during visit, greater than 50% counseling and assimilation of information, chart review, and discussion of plan.   Assessment & Plan:  Izaak E Ortlieb is a 70 y.o. male . Hemorrhoids, unspecified hemorrhoid type - Plan: hydrocortisone (ANUSOL-HC) 25 MG suppository  -Likely cause of perianal pain, constipation prevention discussed and MiraLAX discussed for treatment.  Handout given on both conditions.  Anusol HC temporarily as needed with RTC precautions if not improving.  Type 2 diabetes mellitus with chronic kidney disease, without long-term current use of insulin, unspecified CKD stage (HCC) - Plan: atorvastatin (LIPITOR) 10 MG tablet, metFORMIN (GLUCOPHAGE) 500 MG tablet, Microalbumin / creatinine urine ratio, Comprehensive metabolic panel, Lipid panel, Hemoglobin A1c, HM Diabetes Foot Exam  -Check A1c other labs as above, continue Metformin same dose for now.  Continue statin.  Prevnar updated.  Essential hypertension - Plan: Comprehensive metabolic panel, Lipid panel, carvedilol (COREG) 3.125 MG tablet, hydrALAZINE (APRESOLINE) 50 MG tablet  -Elevated in office, off carvedilol.  We  will try to restart at much lower dose, RTC precautions if return of side effects.  Lower readings at home.  Peripheral edema  -Compression stockings recommended, continue  furosemide.  Need for prophylactic vaccination against Streptococcus pneumoniae (pneumococcus) - Plan: Pneumococcal conjugate vaccine 13-valent IM   Meds ordered this encounter  Medications  . atorvastatin (LIPITOR) 10 MG tablet    Sig: TAKE 1 TABLET(10 MG) BY MOUTH DAILY    Dispense:  90 tablet    Refill:  2  . furosemide (LASIX) 40 MG tablet    Sig: Take 2 tablets (80 mg total) by mouth daily.    Dispense:  180 tablet    Refill:  1  . metFORMIN (GLUCOPHAGE) 500 MG tablet    Sig: Take 1 tablet (500 mg total) by mouth 2 (two) times daily with a meal.    Dispense:  180 tablet    Refill:  3  . carvedilol (COREG) 3.125 MG tablet    Sig: Take 1 tablet (3.125 mg total) by mouth 2 (two) times daily with a meal.    Dispense:  60 tablet    Refill:  3  . hydrocortisone (ANUSOL-HC) 25 MG suppository    Sig: Place 1 suppository (25 mg total) rectally 2 (two) times daily as needed for hemorrhoids or anal itching.    Dispense:  12 suppository    Refill:  1  . hydrALAZINE (APRESOLINE) 50 MG tablet    Sig: Take 1 tablet (50 mg total) by mouth 3 (three) times daily.    Dispense:  270 tablet    Refill:  1   Patient Instructions   Start lower dose carvedilol, no other med changes. If fatigue returns, stop that med and be seen right away.Keep a record of your blood pressures outside of the office and bring them to the next office visit.  anusol [facility] for now for hemorrhoids,See info on constipation. That can worsen hemorrhoids. Miralax once per day if needed for softer stools for now. Return to the clinic or go to the nearest emergency room if any of your symptoms worsen or new symptoms occur. If not better in next 2 weeks, I recommend being seen.    My new office:  [facility] Address: [address],  [place], [place] [address] Phone: [phone]    If you have lab work done today you will be contacted with your lab results within the next 2 weeks.  If you have not heard from [place] then please contact us. The fastest way to get your results is to register for My Chart.   Hemorrhoids Hemorrhoids are swollen veins that may develop:  In the butt (rectum). These are called internal hemorrhoids.  Around the opening of the butt (anus). These are called external hemorrhoids. Hemorrhoids can cause pain, itching, or bleeding. Most of the time, they do not cause serious problems. They usually get better with diet changes, lifestyle changes, and other home treatments. What are the causes? This condition may be caused by:  Having trouble pooping (constipation).  Pushing hard (straining) to poop.  Watery poop (diarrhea).  Pregnancy.  Being very overweight (obese).  Sitting for long periods of time.  Heavy lifting or other activity that causes you to strain.  Anal sex.  Riding a bike for a long period of time. What are the signs or symptoms? Symptoms of this condition include:  Pain.  Itching or soreness in the butt.  Bleeding from the butt.  Leaking poop.  Swelling in the area.  One or more lumps around the opening of your butt. How is this diagnosed? A doctor can often diagnose this condition by looking at  the affected area. The doctor may also:  Do an exam that involves feeling the area with a gloved hand (digital rectal exam).  Examine the area inside your butt using a small tube (anoscope).  Order blood tests. This may be done if you have lost a lot of blood.  Have you get a test that involves looking inside the colon using a flexible tube with a camera on the end (sigmoidoscopy or colonoscopy). How is this treated? This condition can usually be treated at home. Your doctor may tell you to change what you eat, make lifestyle changes, or try home treatments. If these  do not help, procedures can be done to remove the hemorrhoids or make them smaller. These may involve:  Placing rubber bands at the base of the hemorrhoids to cut off their blood supply.  Injecting medicine into the hemorrhoids to shrink them.  Shining a type of light energy onto the hemorrhoids to cause them to fall off.  Doing surgery to remove the hemorrhoids or cut off their blood supply. Follow these instructions at home: Eating and drinking  Eat foods that have a lot of fiber in them. These include whole grains, beans, nuts, fruits, and vegetables.  Ask your doctor about taking products that have added fiber (fibersupplements).  Reduce the amount of fat in your diet. You can do this by: ? Eating low-fat dairy products. ? Eating less red meat. ? Avoiding processed foods.  Drink enough fluid to keep your pee (urine) pale yellow.   Managing pain and swelling  Take a warm-water bath (sitz bath) for 20 minutes to ease pain. Do this 3-4 times a day. You may do this in a bathtub or using a portable sitz bath that fits over the toilet.  If told, put ice on the painful area. It may be helpful to use ice between your warm baths. ? Put ice in a plastic bag. ? Place a towel between your skin and the bag. ? Leave the ice on for 20 minutes, 2-3 times a day.   General instructions  Take over-the-counter and prescription medicines only as told by your doctor. ? Medicated creams and medicines may be used as told.  Exercise often. Ask your doctor how much and what kind of exercise is best for you.  Go to the bathroom when you have the urge to poop. Do not wait.  Avoid pushing too hard when you poop.  Keep your butt dry and clean. Use wet toilet paper or moist towelettes after pooping.  Do not sit on the toilet for a long time.  Keep all follow-up visits as told by your doctor. This is important. Contact a doctor if you:  Have pain and swelling that do not get better with treatment  or medicine.  Have trouble pooping.  Cannot poop.  Have pain or swelling outside the area of the hemorrhoids. Get help right away if you have:  Bleeding that will not stop. Summary  Hemorrhoids are swollen veins in the butt or around the opening of the butt.  They can cause pain, itching, or bleeding.  Eat foods that have a lot of fiber in them. These include whole grains, beans, nuts, fruits, and vegetables.  Take a warm-water bath (sitz bath) for 20 minutes to ease pain. Do this 3-4 times a day. This information is not intended to replace advice given to you by your health care provider. Make sure you discuss any questions you have with your health care provider.  Document Revised: 06/08/2018 Document Reviewed: 10/20/2017 Elsevier Patient Education  2021 [place].   Constipation, Adult Constipation is when a person has trouble pooping (having a bowel movement). When you have this condition, you may poop fewer than 3 times a week. Your poop (stool) may also be dry, hard, or bigger than normal. Follow these instructions at home: Eating and drinking  Eat foods that have a lot of fiber, such as: ? Fresh fruits and vegetables. ? Whole grains. ? Beans.  Eat less of foods that are low in fiber and high in fat and sugar, such as: ? [place] fries. ? Hamburgers. ? Cookies. ? Candy. ? Soda.  Drink enough fluid to keep your pee (urine) pale yellow.   General instructions  Exercise regularly or as told by your doctor. Try to do 150 minutes of exercise each week.  Go to the restroom when you feel like you need to poop. Do not hold it in.  Take over-the-counter and prescription medicines only as told by your doctor. These include any fiber supplements.  When you poop: ? Do deep breathing while relaxing your lower belly (abdomen). ? Relax your pelvic floor. The pelvic floor is a group of muscles that support the rectum, bladder, and intestines (as well as the uterus in  women).  Watch your condition for any changes. Tell your doctor if you notice any.  Keep all follow-up visits as told by your doctor. This is important. Contact a doctor if:  You have pain that gets worse.  You have a fever.  You have not pooped for 4 days.  You vomit.  You are not hungry.  You lose weight.  You are bleeding from the opening of the butt (anus).  You have thin, pencil-like poop. Get help right away if:  You have a fever, and your symptoms suddenly get worse.  You leak poop or have blood in your poop.  Your belly feels hard or bigger than normal (bloated).  You have very bad belly pain.  You feel dizzy or you faint. Summary  Constipation is when a person poops fewer than 3 times a week, has trouble pooping, or has poop that is dry, hard, or bigger than normal.  Eat foods that have a lot of fiber.  Drink enough fluid to keep your pee (urine) pale yellow.  Take over-the-counter and prescription medicines only as told by your doctor. These include any fiber supplements. This information is not intended to replace advice given to you by your health care provider. Make sure you discuss any questions you have with your health care provider. Document Revised: 04/18/2019 Document Reviewed: 04/18/2019 Elsevier Patient Education  2021 [place].   IF you received an x-ray today, you will receive an invoice from [facility]. Please contact [facility] at [phone] with questions or concerns regarding your invoice.   IF you received labwork today, you will receive an invoice from [facility]. Please contact [facility] at [phone] with questions or concerns regarding your invoice.   Our billing staff will not be able to assist you with questions regarding bills from these companies.  You will be contacted with the lab results as soon as they are available. The fastest way to get your results is to activate your My Chart account.  Instructions are located on the last page of this paperwork. If you have not heard from [place] regarding the results in 2 weeks, please contact this office.         Signed,  Merri Ray, MD [facility]

## 2020-08-28 LAB — COMPREHENSIVE METABOLIC PANEL
ALT: 16 IU/L (ref 0–44)
AST: 19 IU/L (ref 0–40)
Albumin/Globulin Ratio: 1.7 (ref 1.2–2.2)
Albumin: 4.5 g/dL (ref 3.8–4.8)
Alkaline Phosphatase: 78 IU/L (ref 44–121)
BUN/Creatinine Ratio: 13 (ref 10–24)
BUN: 23 mg/dL (ref 8–27)
Bilirubin Total: 0.3 mg/dL (ref 0.0–1.2)
CO2: 24 mmol/L (ref 20–29)
Calcium: 9.8 mg/dL (ref 8.6–10.2)
Chloride: 100 mmol/L (ref 96–106)
Creatinine, Ser: 1.75 mg/dL — ABNORMAL HIGH (ref 0.76–1.27)
Globulin, Total: 2.7 g/dL (ref 1.5–4.5)
Glucose: 190 mg/dL — ABNORMAL HIGH (ref 65–99)
Potassium: 3.9 mmol/L (ref 3.5–5.2)
Sodium: 143 mmol/L (ref 134–144)
Total Protein: 7.2 g/dL (ref 6.0–8.5)
eGFR: 42 mL/min/{1.73_m2} — ABNORMAL LOW (ref >59)

## 2020-08-28 LAB — MICROALBUMIN / CREATININE URINE RATIO
Creatinine, Urine: 123.6 mg/dL
Microalb/Creat Ratio: 184 mg/g creat — ABNORMAL HIGH (ref 0–29)
Microalbumin, Urine: 226.9 ug/mL

## 2020-08-28 LAB — HEMOGLOBIN A1C
Est. average glucose Bld gHb Est-mCnc: 163 mg/dL
Hgb A1c MFr Bld: 7.3 % — ABNORMAL HIGH (ref 4.8–5.6)

## 2020-08-28 LAB — LIPID PANEL
Chol/HDL Ratio: 3.4 ratio (ref 0.0–5.0)
Cholesterol, Total: 115 mg/dL (ref 100–199)
HDL: 34 mg/dL — ABNORMAL LOW (ref >39)
LDL Chol Calc (NIH): 56 mg/dL (ref 0–99)
Triglycerides: 144 mg/dL (ref 0–149)
VLDL Cholesterol Cal: 25 mg/dL (ref 5–40)

## 2020-09-05 ENCOUNTER — Other Ambulatory Visit: Payer: Self-pay | Admitting: Family Medicine

## 2020-09-05 DIAGNOSIS — M792 Neuralgia and neuritis, unspecified: Secondary | ICD-10-CM

## 2020-09-05 NOTE — Telephone Encounter (Signed)
Patient is requesting a refill of the following medications: Requested Prescriptions   Pending Prescriptions Disp Refills  . pregabalin (LYRICA) 150 MG capsule [Pharmacy Med Name: PREGABALIN 150MG  CAPSULES] 90 capsule     Sig: TAKE 1 CAPSULE BY MOUTH 3 TIMES DAILY FOR NERVE PAIN OF BACK    Date of patient request: 09/05/2020 Last office visit: 08/27/2020 Date of last refill:07/15/2020 Last refill amount:02/22/2020 Follow up time period per chart: none

## 2020-09-05 NOTE — Telephone Encounter (Signed)
Requested medication (s) are due for refill today: yes  Requested medication (s) are on the active medication list: yes  Last refill: 07/15/20  Future visit scheduled: no  Notes to clinic:  not delegated   Requested Prescriptions  Pending Prescriptions Disp Refills   pregabalin (LYRICA) 150 MG capsule [Pharmacy Med Name: PREGABALIN 150MG  CAPSULES] 90 capsule     Sig: TAKE 1 CAPSULE BY MOUTH 3 TIMES DAILY FOR NERVE PAIN OF BACK      Not Delegated - Neurology:  Anticonvulsants - Controlled Failed - 09/05/2020 12:18 PM      Failed - This refill cannot be delegated      Passed - Valid encounter within last 12 months    Recent Outpatient Visits           1 week ago Hemorrhoids, unspecified hemorrhoid type   [facility], Ranell Patrick, MD   6 months ago Neuropathic pain   [facility], Fenton Malling, MD   10 months ago Peripheral edema   [facility], Ranell Patrick, MD   1 year ago Back tightness   [facility], Ranell Patrick, MD   1 year ago Type 2 diabetes mellitus with chronic kidney disease, without long-term current use of insulin, unspecified CKD stage [facility])   [facility], Ranell Patrick, MD

## 2020-09-11 ENCOUNTER — Encounter: Payer: Self-pay | Admitting: Family Medicine

## 2020-09-11 ENCOUNTER — Other Ambulatory Visit: Payer: Self-pay | Admitting: Family Medicine

## 2020-09-11 DIAGNOSIS — M792 Neuralgia and neuritis, unspecified: Secondary | ICD-10-CM

## 2020-09-12 NOTE — Telephone Encounter (Signed)
Patient states he will call and schedule an appointment to discuss a referral.

## 2020-10-21 ENCOUNTER — Other Ambulatory Visit: Payer: Self-pay | Admitting: Family Medicine

## 2020-10-21 DIAGNOSIS — E1122 Type 2 diabetes mellitus with diabetic chronic kidney disease: Secondary | ICD-10-CM

## 2020-10-22 ENCOUNTER — Ambulatory Visit: Payer: BC Managed Care – PPO | Admitting: Family Medicine

## 2020-10-22 ENCOUNTER — Other Ambulatory Visit: Payer: Self-pay

## 2020-10-22 ENCOUNTER — Encounter: Payer: Self-pay | Admitting: Family Medicine

## 2020-10-22 VITALS — BP 134/86 | HR 85 | Temp 97.9°F | Resp 17 | Ht 67.0 in | Wt 267.4 lb

## 2020-10-22 DIAGNOSIS — N433 Hydrocele, unspecified: Secondary | ICD-10-CM | POA: Diagnosis not present

## 2020-10-22 NOTE — Progress Notes (Signed)
Subjective:  Patient ID: Jeffrey Parks, male    DOB: June 08, 1951  Age: 70 y.o. MRN: [id number]  CC:  Chief Complaint  Patient presents with  . Referral    Pt here today to have Urology referral re ordered as pt is having the same sxs as before, pt has swollen scrotum, pt denies pain or rash, pt has dysuria, denies frequency increase.     HPI Jeffrey Parks presents for   Scrotal swelling, dysuria. Enlarged right testicle noted in October 2020.  Testicular ultrasound with Doppler April 19, 2019 indicated bilateral moderate-sized hydroceles but normal testicles, no sign of infection or mass and normal vascular flow to the testicles.  Small benign-appearing cyst in the right epididymis at 5 mm, normal left epididymis.  He was referred to urology and evaluated by Dr. Karsten Ro on 04/30/2019.  Treatment options were discussed including avoidance of treatment as benign condition versus aspiration with risk of recurrence versus hydrocelectomy.  Initial plan for hydrocelectomy in December 2020.  Symptoms had improved with decreased swelling so canceled surgery.  Now reports recurrence of symptoms - more swollen past few months.  R greater than left. Not painful - just enlarged.  No associated dysuria,  no frequency/urgency.  No fever, no new back pain or hematuria.  No rash, no penile discharge.  He is monogamous with wife.  No known exposure to sexually transmitted infections.  Would like to meet with urology again.   History Patient Active Problem List   Diagnosis Date Noted  . Chronic kidney disease, stage III (moderate) ([place]) 02/08/2018  . Stenosis of cervical spine with myelopathy ([place]) 06/22/2017  . Oropharyngeal dysphagia 06/22/2017  . Type 2 diabetes mellitus with complication ([place]) 03/50/0938  . Gait instability 01/14/2017  . 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 Cervicalgia 01/14/2017  . Leg swelling 09/08/2016  . Sleep apnea 09/08/2016  . Dizziness 09/08/2016  . Fatigue 05/30/2015   . Hypogonadism male 05/22/2015  . Morbid obesity due to excess calories ([place]) 05/22/2015  . 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 Essential hypertension 05/07/2015  . Erectile dysfunction 05/07/2015   Past Medical History:  Diagnosis Date  . Hypertension   . Low testosterone   . Sickle cell trait [facility])    Past Surgical History:  Procedure Laterality Date  . neck surgery, Dec. 14,2018    . None     Allergies  Allergen Reactions  . Norvasc [Amlodipine]    Prior to Admission medications   Medication Sig Start Date End Date Taking? Authorizing Provider  acetaminophen (TYLENOL ARTHRITIS PAIN) 650 MG CR tablet Take 1 tablet (650 mg total) by mouth every 6 (six) hours as needed for pain. 06/09/16  Yes Plunkett, Loree Fee, MD  atorvastatin (LIPITOR) 10 MG tablet TAKE 1 TABLET(10 MG) BY MOUTH DAILY 08/27/20  Yes Wendie Agreste, MD  benzonatate (TESSALON) 100 MG capsule Take 1 capsule (100 mg total) by mouth every 8 (eight) hours. 04/07/20  Yes Lamptey, Myrene Galas, MD  bisacodyl (DULCOLAX) 5 MG EC tablet Take 1 tablet (5 mg total) by mouth daily as needed for moderate constipation. 12/02/16  Yes McVey, Gelene Mink, PA-C  carvedilol (COREG) 3.125 MG tablet Take 1 tablet (3.125 mg total) by mouth 2 (two) times daily with a meal. 08/27/20  Yes Wendie Agreste, MD  furosemide (LASIX) 40 MG tablet Take 2 tablets (80 mg total) by mouth daily. 08/27/20  Yes Wendie Agreste, MD  hydrALAZINE (APRESOLINE) 50 MG tablet Take 1 tablet (50 mg total) by mouth  3 (three) times daily. 08/27/20  Yes Wendie Agreste, MD  hydrocortisone (ANUSOL-HC) 25 MG suppository Place 1 suppository (25 mg total) rectally 2 (two) times daily as needed for hemorrhoids or anal itching. 08/27/20  Yes Wendie Agreste, MD  losartan-hydrochlorothiazide [facility]) 100-25 MG tablet TAKE 1 TABLET BY MOUTH DAILY 08/25/20  Yes Wendie Agreste, MD  metFORMIN (GLUCOPHAGE) 500 MG tablet Take 1 tablet (500 mg total) by mouth 2 (two)  times daily with a meal. 08/27/20  Yes Wendie Agreste, MD  polyethylene glycol (MIRALAX / GLYCOLAX) 17 g packet Take 17 g by mouth daily. 04/07/20  Yes Lamptey, Myrene Galas, MD  pregabalin (LYRICA) 150 MG capsule TAKE 1 CAPSULE BY MOUTH 3 TIMES DAILY FOR NERVE PAIN OF BACK 09/06/20  Yes Wendie Agreste, MD  Sennosides-Docusate Sodium (SENNA-DOCUSATE SODIUM PO) Take by mouth.   Yes [provider]   Social History   Socioeconomic History  . Marital status: Single    Spouse name: Not on file  . Number of children: Not on file  . Years of education: Not on file  . Highest education level: Not on file  Occupational History  . Not on file  Tobacco Use  . Smoking status: Former Smoker    Types: Cigarettes  . Smokeless tobacco: Never Used  . Tobacco comment: Quit two years ago.  Light smoker prior.   Vaping Use  . Vaping Use: Never used  Substance and Sexual Activity  . Alcohol use: Yes    Alcohol/week: 3.0 standard drinks    Types: 3 Standard drinks or equivalent per week  . Drug use: No  . Sexual activity: Not on file  Other Topics Concern  . Not on file  Social History Narrative   Lives with wife, 2 adult children and 3 grandchildren   Has 2 deceased children - 34 children total   Has PhD in literature   Professor at [employer]   Social Determinants of Radio broadcast assistant Strain: Not on [employer] Insecurity: Not on file  Transportation Needs: Not on file  Physical Activity: Not on file  Stress: Not on file  Social Connections: Not on file  Intimate Partner Violence: Not on file    Review of Systems Per HPI.   Objective:   Vitals:   10/22/20 1032  BP: 134/86  Pulse: 85  Resp: 17  Temp: 97.9 F (36.6 C)  TempSrc: Temporal  SpO2: 98%  Weight: 267 lb 6.4 oz (121.3 kg)  Height: 5\' 7"  (1.702 m)     Physical Exam Constitutional:      General: He is not in acute distress.    Appearance: He is well-developed.  HENT:     Head:  Normocephalic and atraumatic.  Cardiovascular:     Rate and Rhythm: Normal rate.  Pulmonary:     Effort: Pulmonary effort is normal.  Genitourinary:    Pubic Area: No rash.      Penis: Normal. No phimosis or paraphimosis.      Testes:        Right: Swelling present. Tenderness not present.        Left: Swelling present. Tenderness not present.     Epididymis:     Right: No tenderness.     Left: No tenderness.     Comments: Enlarged/fullness of R testicle greater than left. Nontender.   Neurological:     Mental Status: He is alert and oriented to person, place, and time.  Assessment & Plan:  Jeffrey Parks is a 70 y.o. male . Hydrocele, bilateral - Plan: Ambulatory referral to Urology Recurrence of swelling, otherwise asymptomatic. Refer back to urology to discuss definitive treatment, possible hydrocoelectomy.   No orders of the defined types were placed in this encounter.  There are no Patient Instructions on file for this visit.    Signed, Merri Ray, MD [facility]

## 2020-12-22 ENCOUNTER — Other Ambulatory Visit: Payer: Self-pay | Admitting: Family Medicine

## 2020-12-22 DIAGNOSIS — M792 Neuralgia and neuritis, unspecified: Secondary | ICD-10-CM

## 2020-12-22 NOTE — Telephone Encounter (Signed)
Last filled 09/06/20 #90 with no refills Last visit 10/22/20 Next visit none

## 2020-12-23 NOTE — Telephone Encounter (Signed)
He has been evaluated by pain management as well as spine center previously.  Are those providers prescribing Lyrica?  I did refill medication temporarily, but may need office visit to discuss further if they are not prescribing that medication.Controlled substance database (PDMP) reviewed. No concerns appreciated.  Last filled in April.

## 2021-01-22 ENCOUNTER — Emergency Department (HOSPITAL_COMMUNITY)
Admission: EM | Admit: 2021-01-22 | Discharge: 2021-01-23 | Disposition: A | Discharge status: Home / Self Care | Payer: BC Managed Care – PPO | Attending: Emergency Medicine | Admitting: Emergency Medicine

## 2021-01-22 ENCOUNTER — Emergency Department (HOSPITAL_COMMUNITY): Payer: BC Managed Care – PPO

## 2021-01-22 ENCOUNTER — Other Ambulatory Visit: Payer: Self-pay

## 2021-01-22 ENCOUNTER — Encounter (HOSPITAL_COMMUNITY): Payer: Self-pay | Admitting: Emergency Medicine

## 2021-01-22 DIAGNOSIS — N183 Chronic kidney disease, stage 3 unspecified: Secondary | ICD-10-CM | POA: Diagnosis not present

## 2021-01-22 DIAGNOSIS — I129 Hypertensive chronic kidney disease with stage 1 through stage 4 chronic kidney disease, or unspecified chronic kidney disease: Secondary | ICD-10-CM | POA: Diagnosis not present

## 2021-01-22 DIAGNOSIS — Z79899 Other long term (current) drug therapy: Secondary | ICD-10-CM | POA: Diagnosis not present

## 2021-01-22 DIAGNOSIS — R059 Cough, unspecified: Secondary | ICD-10-CM | POA: Diagnosis present

## 2021-01-22 DIAGNOSIS — Z87891 Personal history of nicotine dependence: Secondary | ICD-10-CM | POA: Diagnosis not present

## 2021-01-22 DIAGNOSIS — Z7984 Long term (current) use of oral hypoglycemic drugs: Secondary | ICD-10-CM | POA: Diagnosis not present

## 2021-01-22 DIAGNOSIS — E119 Type 2 diabetes mellitus without complications: Secondary | ICD-10-CM | POA: Diagnosis not present

## 2021-01-22 DIAGNOSIS — U071 COVID-19: Secondary | ICD-10-CM | POA: Diagnosis not present

## 2021-01-22 LAB — I-STAT CHEM 8, ED
BUN: 13 mg/dL (ref 8–23)
Calcium, Ion: 1.18 mmol/L (ref 1.15–1.40)
Chloride: 99 mmol/L (ref 98–111)
Creatinine, Ser: 1.5 mg/dL — ABNORMAL HIGH (ref 0.61–1.24)
Glucose, Bld: 153 mg/dL — ABNORMAL HIGH (ref 70–99)
HCT: 42 % (ref 39.0–52.0)
Hemoglobin: 14.3 g/dL (ref 13.0–17.0)
Potassium: 3.2 mmol/L — ABNORMAL LOW (ref 3.5–5.1)
Sodium: 141 mmol/L (ref 135–145)
TCO2: 29 mmol/L (ref 22–32)

## 2021-01-22 NOTE — ED Triage Notes (Signed)
Pt was diagnosed with covid 1 week ago. Pt reports body aches, headache, sore throat for the past week.

## 2021-01-22 NOTE — ED Provider Notes (Signed)
Emergency Medicine Provider Triage Evaluation Note  Jeffrey Parks , a 70 y.o. male  was evaluated in triage.  Pt complains of cough, body aches, sob. Tested positive for covid.  Review of Systems  Positive: Cough, body aches, fatigue, sob Negative: Chest pain  Physical Exam  BP (!) 199/99 (BP Location: Left Arm)   Pulse 81   Temp 99.2 F (37.3 C) (Oral)   Resp 18   SpO2 95%  Gen:   Awake, no distress   Resp:  Normal effort  MSK:   Moves extremities without difficulty  Other:    Medical Decision Making  Medically screening exam initiated at 9:49 PM.  Appropriate orders placed.  Jeffrey Parks was informed that the remainder of the evaluation will be completed by another provider, this initial triage assessment does not replace that evaluation, and the importance of remaining in the ED until their evaluation is complete.     Bishop Dublin 01/22/21 2150    Dorie Rank, MD 01/25/21 [phone]

## 2021-01-22 NOTE — ED Notes (Signed)
Pt ambulatory in ED lobby. 

## 2021-01-23 LAB — RESP PANEL BY RT-PCR (FLU A&B, COVID) ARPGX2
Influenza A by PCR: NEGATIVE
Influenza B by PCR: NEGATIVE
SARS Coronavirus 2 by RT PCR: POSITIVE — AB

## 2021-01-23 MED ORDER — PAXLOVID 10 X 150 MG & 10 X 100MG PO TBPK: 2.0000 | ORAL_TABLET | Freq: Two times a day (BID) | ORAL | 0 refills | Status: AC

## 2021-01-23 MED ORDER — BENZONATATE 100 MG PO CAPS: 100.0000 mg | ORAL_CAPSULE | Freq: Three times a day (TID) | ORAL | 0 refills | Status: DC | PRN

## 2021-01-23 NOTE — ED Notes (Signed)
Pt informed me that he has not taken his nightly blood pressure medication tonight.

## 2021-01-23 NOTE — Discharge Instructions (Addendum)
Do not take your atorvastatin until you finish your Paxlovid.

## 2021-01-23 NOTE — ED Provider Notes (Signed)
[name] Note   CSN: [id number] Arrival date & time: 01/22/21  2120     History Chief Complaint  Patient presents with   Covid Positive    Jeffrey Parks is a 70 y.o. male.  The history is provided by the patient, the spouse and medical records.  Jeffrey Parks is a 70 y.o. male who presents to the Emergency Department complaining of body aches.  He presents to the ED for COVID 19.  He started feeling poorly Wednesday night with cough, poor sleep.  Around Monday he developed mild body aches.  Had low grade fever on Wednesday night.  No sore throat.  No sob.  No N/V/D.  He tested positive for COVID 19 on Thursday. Has a history of hypertension, diabetes and CKD.  Has been vaccinated for COVID 19.       Past Medical History:  Diagnosis Date   Hypertension    Low testosterone    Sickle cell trait [facility])     Patient Active Problem List   Diagnosis Date Noted   Chronic kidney disease, stage III (moderate) ([place]) 02/08/2018   Stenosis of cervical spine with myelopathy ([place]) 06/22/2017   Oropharyngeal dysphagia 06/22/2017   Type 2 diabetes mellitus with complication ([place]) 0000000   Gait instability 01/14/2017   Hyperreflexia 01/14/2017   Cervicalgia 01/14/2017   Leg swelling 09/08/2016   Sleep apnea 09/08/2016   Dizziness 09/08/2016   Fatigue 05/30/2015   Hypogonadism male 05/22/2015   Morbid obesity due to excess calories ([place]) 05/22/2015   Testosterone insufficiency 05/22/2015   Essential hypertension 05/07/2015   Erectile dysfunction 05/07/2015    Past Surgical History:  Procedure Laterality Date   neck surgery, Dec. 14,2018     None         Family History  Problem Relation Age of Onset   Hypertension Mother    Diabetes Other    Sickle cell anemia Son    Sickle cell anemia Son     Social History   Tobacco Use   Smoking status: Former    Types: Cigarettes   Smokeless tobacco: Never   Tobacco  comments:    Quit two years ago.  Light smoker prior.   Vaping Use   Vaping Use: Never used  Substance Use Topics   Alcohol use: Yes    Alcohol/week: 3.0 standard drinks    Types: 3 Standard drinks or equivalent per week   Drug use: No    Home Medications Prior to Admission medications   Medication Sig Start Date End Date Taking? Authorizing Provider  benzonatate (TESSALON) 100 MG capsule Take 1 capsule (100 mg total) by mouth 3 (three) times daily as needed for cough. 01/23/21  Yes Quintella Reichert, MD  nirmatrelvir/ritonavir EUA, renal dosing, (PAXLOVID) TBPK Take 2 tablets by mouth 2 (two) times daily for 5 days. Patient GFR is 48. Take nirmatrelvir (150 mg) one tablet twice daily for 5 days and ritonavir (100 mg) one tablet twice daily for 5 days. 01/23/21 01/28/21 Yes Quintella Reichert, MD  acetaminophen (TYLENOL ARTHRITIS PAIN) 650 MG CR tablet Take 1 tablet (650 mg total) by mouth every 6 (six) hours as needed for pain. 06/09/16   Blanchie Dessert, MD  atorvastatin (LIPITOR) 10 MG tablet TAKE 1 TABLET(10 MG) BY MOUTH DAILY 08/27/20   Wendie Agreste, MD  bisacodyl (DULCOLAX) 5 MG EC tablet Take 1 tablet (5 mg total) by mouth daily as needed for moderate constipation. 12/02/16   McVey,  Gelene Mink, PA-C  carvedilol (COREG) 3.125 MG tablet Take 1 tablet (3.125 mg total) by mouth 2 (two) times daily with a meal. 08/27/20   Wendie Agreste, MD  furosemide (LASIX) 40 MG tablet Take 2 tablets (80 mg total) by mouth daily. 08/27/20   Wendie Agreste, MD  hydrALAZINE (APRESOLINE) 50 MG tablet Take 1 tablet (50 mg total) by mouth 3 (three) times daily. 08/27/20   Wendie Agreste, MD  hydrocortisone (ANUSOL-HC) 25 MG suppository Place 1 suppository (25 mg total) rectally 2 (two) times daily as needed for hemorrhoids or anal itching. 08/27/20   Wendie Agreste, MD  losartan-hydrochlorothiazide [facility]) 100-25 MG tablet TAKE 1 TABLET BY MOUTH DAILY 08/25/20   Wendie Agreste, MD  metFORMIN  (GLUCOPHAGE) 500 MG tablet Take 1 tablet (500 mg total) by mouth 2 (two) times daily with a meal. 08/27/20   Wendie Agreste, MD  polyethylene glycol (MIRALAX / GLYCOLAX) 17 g packet Take 17 g by mouth daily. 04/07/20   Chase Picket, MD  pregabalin (LYRICA) 150 MG capsule TAKE 1 CAPSULE BY MOUTH THREE TIMES DAILY FOR NERVE PAIN OF BACK 12/23/20   Wendie Agreste, MD  Sennosides-Docusate Sodium (SENNA-DOCUSATE SODIUM PO) Take by mouth.    [provider]    Allergies    Norvasc [amlodipine]  Review of Systems   Review of Systems  All other systems reviewed and are negative.  Physical Exam Updated Vital Signs BP (!) 185/96   Pulse 65   Temp 98.9 F (37.2 C)   Resp 16   SpO2 99%   Physical Exam Vitals and nursing note reviewed.  Constitutional:      Appearance: He is well-developed.  HENT:     Head: Normocephalic and atraumatic.  Cardiovascular:     Rate and Rhythm: Normal rate and regular rhythm.     Heart sounds: No murmur heard. Pulmonary:     Effort: Pulmonary effort is normal. No respiratory distress.     Breath sounds: Normal breath sounds.  Abdominal:     Palpations: Abdomen is soft.     Tenderness: There is no abdominal tenderness. There is no guarding or rebound.  Musculoskeletal:        General: No tenderness.     Comments: Nonpitting edema to BLE  Skin:    General: Skin is warm and dry.  Neurological:     Mental Status: He is alert and oriented to person, place, and time.  Psychiatric:        Behavior: Behavior normal.    ED Results / Procedures / Treatments   Labs (all labs ordered are listed, but only abnormal results are displayed) Labs Reviewed  I-STAT CHEM 8, ED - Abnormal; Notable for the following components:      Result Value   Potassium 3.2 (*)    Creatinine, Ser 1.50 (*)    Glucose, Bld 153 (*)    All other components within normal limits  RESP PANEL BY RT-PCR (FLU A&B, COVID) ARPGX2    EKG None  Radiology DG Chest 2  View  Result Date: 01/22/2021 CLINICAL DATA:  COVID EXAM: CHEST - 2 VIEW COMPARISON:  04/07/2020 FINDINGS: The heart size and mediastinal contours are within normal limits. Both lungs are clear. The visualized skeletal structures are unremarkable. IMPRESSION: No active cardiopulmonary disease. Electronically Signed   By: Donavan Foil M.D.   On: 01/22/2021 22:15    Procedures Procedures   Medications Ordered in ED Medications - No  data to display  ED Course  I have reviewed the triage vital signs and the nursing notes.  Pertinent labs & imaging results that were available during my care of the patient were reviewed by me and considered in my medical decision making (see chart for details).    MDM Rules/Calculators/A&P                          patient here for evaluation of COVID-19 infection. He is on day three of his symptoms. He is non-toxic appearing on evaluation with no respiratory distress. He is hypertensive in the emergency department but did not take his evening medications. He is high risk for complications from [id number] due to his history of hypertension, diabetes and CKD. He is a candidate for Paxlovid use. Discussed with patient risks of the medications. Discussed holding his atorvastatin while taking the medication. Due to his underlying renal insufficiency he will you to take a reduced dose. Discussed home care for COVID as well as outpatient follow-up and return precautions.  Final Clinical Impression(s) / ED Diagnoses Final diagnoses:  [id number] virus infection    Rx / DC Orders ED Discharge Orders          Ordered    nirmatrelvir/ritonavir EUA, renal dosing, (PAXLOVID) TBPK  2 times daily        01/23/21 0307    benzonatate (TESSALON) 100 MG capsule  3 times daily PRN        01/23/21 0342             Quintella Reichert, MD 01/23/21 [phone]

## 2021-02-24 ENCOUNTER — Other Ambulatory Visit: Payer: Self-pay | Admitting: Family Medicine

## 2021-02-24 DIAGNOSIS — I10 Essential (primary) hypertension: Secondary | ICD-10-CM

## 2021-02-24 DIAGNOSIS — M792 Neuralgia and neuritis, unspecified: Secondary | ICD-10-CM

## 2021-02-24 NOTE — Telephone Encounter (Signed)
Requesting:Lyrica '150mg'$  Contract: UDS: Last Visit:10/22/20 Next Visit:n/a Last Refill:12/23/20 90 caps 0 refills  Please Advise

## 2021-02-24 NOTE — Telephone Encounter (Signed)
Controlled substance database (PDMP) reviewed. No concerns appreciated.  Refilled lyrica

## 2021-04-24 ENCOUNTER — Other Ambulatory Visit: Payer: Self-pay

## 2021-04-24 DIAGNOSIS — M792 Neuralgia and neuritis, unspecified: Secondary | ICD-10-CM

## 2021-04-24 NOTE — Telephone Encounter (Signed)
Caller name:Bengie Hoffert  Caller callback [phone]  Encourage patient to contact the pharmacy for refills or they can request refills through [facility]  (Please schedule appointment if patient has not been seen in over a year)  MEDICATION NAME & DOSE:pregabalin (LYRICA) 150 MG capsule   Notes/Comments from patient:  WHAT PHARMACY WOULD THEY LIKE THIS SENT TO: [facility] [place] [facility] [address] [facility] [address]   Please notify patient: It takes 48-72 hours to process rx refill requests Ask patient to call pharmacy to ensure rx is ready before heading there.   (CLINICAL TO FILL OR ROUTE PER PROTOCOLS)

## 2021-04-27 MED ORDER — PREGABALIN 150 MG PO CAPS: ORAL_CAPSULE | ORAL | 0 refills | Status: DC

## 2021-04-27 NOTE — Telephone Encounter (Signed)
Requesting:Lyrica 150 mg Contract: UDS: Last Visit:10/22/20 Next Visit:05/29/21 Last Refill:02/24/21 90 tabs 0 refills  Please Advise

## 2021-05-21 ENCOUNTER — Encounter: Payer: BC Managed Care – PPO | Admitting: Family Medicine

## 2021-05-29 ENCOUNTER — Encounter: Payer: Self-pay | Admitting: Registered Nurse

## 2021-05-29 ENCOUNTER — Ambulatory Visit: Payer: BC Managed Care – PPO | Admitting: Registered Nurse

## 2021-05-29 ENCOUNTER — Other Ambulatory Visit: Payer: Self-pay

## 2021-05-29 VITALS — BP 158/85 | HR 67 | Temp 98.2°F | Resp 18 | Ht 67.0 in | Wt 257.2 lb

## 2021-05-29 DIAGNOSIS — J069 Acute upper respiratory infection, unspecified: Secondary | ICD-10-CM

## 2021-05-29 DIAGNOSIS — R6889 Other general symptoms and signs: Secondary | ICD-10-CM

## 2021-05-29 LAB — POCT INFLUENZA A/B
Influenza A, POC: NEGATIVE
Influenza B, POC: NEGATIVE

## 2021-05-29 MED ORDER — DM-GUAIFENESIN ER 30-600 MG PO TB12: 1.0000 | ORAL_TABLET | Freq: Two times a day (BID) | ORAL | 0 refills | Status: DC

## 2021-05-29 MED ORDER — PREDNISONE 20 MG PO TABS: 20.0000 mg | ORAL_TABLET | Freq: Every day | ORAL | 0 refills | Status: DC

## 2021-05-29 MED ORDER — AZELASTINE HCL 0.1 % NA SOLN
1.0000 | Freq: Two times a day (BID) | NASAL | 12 refills | Status: DC
Start: 2021-05-29 — End: 2021-08-31

## 2021-05-29 NOTE — Progress Notes (Signed)
Established Patient Office Visit  Subjective:  Patient ID: Jeffrey Parks, male    DOB: 08-Jul-1950  Age: 70 y.o. MRN: [id number]  CC:  Chief Complaint  Patient presents with   Cough    Patient states starting yesterday he had cough, congestion, runny nose ,    HPI Jeffrey Parks presents for cough  Onset yesterday Some congestion and runny nose.  Steady symptoms.  Has taken OTC - nyquil and dayquil - somewhat effective.   Home covid test was negative.   Some aches and fatigue.  Denies nvd, shob, doe, chest pain, headache  Concerned for flu  Past Medical History:  Diagnosis Date   Hypertension    Low testosterone    Sickle cell trait ([place])     Past Surgical History:  Procedure Laterality Date   neck surgery, Dec. 14,2018     None      Family History  Problem Relation Age of Onset   Hypertension Mother    Diabetes Other    Sickle cell anemia Son    Sickle cell anemia Son     Social History   Socioeconomic History   Marital status: Single    Spouse name: Not on file   Number of children: Not on file   Years of education: Not on file   Highest education level: Not on file  Occupational History   Not on file  Tobacco Use   Smoking status: Former    Types: Cigarettes   Smokeless tobacco: Never   Tobacco comments:    Quit two years ago.  Light smoker prior.   Vaping Use   Vaping Use: Never used  Substance and Sexual Activity   Alcohol use: Yes    Alcohol/week: 3.0 standard drinks    Types: 3 Standard drinks or equivalent per week   Drug use: No   Sexual activity: Not on file  Other Topics Concern   Not on file  Social History Narrative   Lives with wife, 2 adult children and 3 grandchildren   Has 2 deceased children - 4 children total   Has PhD in literature   Professor at [employer]   Social Determinants of Radio broadcast assistant Strain: Not on file  Food Insecurity: Not on file  Transportation Needs: Not on file   Physical Activity: Not on file  Stress: Not on file  Social Connections: Not on file  Intimate Partner Violence: Not on file    Outpatient Medications Prior to Visit  Medication Sig Dispense Refill   acetaminophen (TYLENOL ARTHRITIS PAIN) 650 MG CR tablet Take 1 tablet (650 mg total) by mouth every 6 (six) hours as needed for pain. 30 tablet 0   atorvastatin (LIPITOR) 10 MG tablet TAKE 1 TABLET(10 MG) BY MOUTH DAILY 90 tablet 2   benzonatate (TESSALON) 100 MG capsule Take 1 capsule (100 mg total) by mouth 3 (three) times daily as needed for cough. 15 capsule 0   bisacodyl (DULCOLAX) 5 MG EC tablet Take 1 tablet (5 mg total) by mouth daily as needed for moderate constipation. 30 tablet 0   carvedilol (COREG) 3.125 MG tablet Take 1 tablet (3.125 mg total) by mouth 2 (two) times daily with a meal. 60 tablet 3   furosemide (LASIX) 40 MG tablet Take 2 tablets (80 mg total) by mouth daily. 180 tablet 1   hydrALAZINE (APRESOLINE) 50 MG tablet Take 1 tablet (50 mg total) by mouth 3 (three) times daily. 270 tablet 1  hydrocortisone (ANUSOL-HC) 25 MG suppository Place 1 suppository (25 mg total) rectally 2 (two) times daily as needed for hemorrhoids or anal itching. 12 suppository 1   losartan-hydrochlorothiazide (HYZAAR) 100-25 MG tablet TAKE 1 TABLET BY MOUTH DAILY 90 tablet 1   metFORMIN (GLUCOPHAGE) 500 MG tablet Take 1 tablet (500 mg total) by mouth 2 (two) times daily with a meal. 180 tablet 3   polyethylene glycol (MIRALAX / GLYCOLAX) 17 g packet Take 17 g by mouth daily. 28 each 0   pregabalin (LYRICA) 150 MG capsule TAKE 1 CAPSULE BY MOUTH THREE TIMES DAILY FOR NERVE PAIN 90 capsule 0   Sennosides-Docusate Sodium (SENNA-DOCUSATE SODIUM PO) Take by mouth.     No facility-administered medications prior to visit.    Allergies  Allergen Reactions   Norvasc [Amlodipine]     ROS Review of Systems  Constitutional:  Positive for fatigue.  HENT:  Positive for sinus pressure, sneezing and  sore throat.   Eyes: Negative.   Respiratory:  Positive for cough and chest tightness. Negative for apnea, choking, shortness of breath, wheezing and stridor.   Cardiovascular: Negative.   Gastrointestinal: Negative.   Genitourinary: Negative.   Musculoskeletal:  Positive for myalgias. Negative for arthralgias, back pain, gait problem, joint swelling, neck pain and neck stiffness.  Skin: Negative.   Neurological: Negative.   Psychiatric/Behavioral: Negative.    All other systems reviewed and are negative.    Objective:    Physical Exam Constitutional:      General: He is not in acute distress.    Appearance: Normal appearance. He is normal weight. He is not ill-appearing, toxic-appearing or diaphoretic.  Cardiovascular:     Rate and Rhythm: Normal rate and regular rhythm.     Heart sounds: Normal heart sounds. No murmur heard.   No friction rub. No gallop.  Pulmonary:     Effort: Pulmonary effort is normal. No respiratory distress.     Breath sounds: Normal breath sounds. No stridor. No wheezing, rhonchi or rales.  Chest:     Chest wall: No tenderness.  Neurological:     General: No focal deficit present.     Mental Status: He is alert and oriented to person, place, and time. Mental status is at baseline.  Psychiatric:        Mood and Affect: Mood normal.        Behavior: Behavior normal.        Thought Content: Thought content normal.        Judgment: Judgment normal.    BP (!) 158/85    Pulse 67    Temp 98.2 F (36.8 C) (Temporal)    Resp 18    Ht _0  (1.702 m)    Wt 257 lb 3.2 oz (116.7 kg)    SpO2 99%    BMI 40.28 kg/m  Wt Readings from Last 3 Encounters:  05/29/21 257 lb 3.2 oz (116.7 kg)  10/22/20 267 lb 6.4 oz (121.3 kg)  08/27/20 263 lb (119.3 kg)     Health Maintenance Due  Topic Date Due   Zoster Vaccines- Shingrix (1 of 2) Never done   OPHTHALMOLOGY EXAM  01/03/2020   COVID-19 Vaccine (4 - Booster for Moderna series) 07/24/2020   INFLUENZA VACCINE   01/12/2021   HEMOGLOBIN A1C  02/27/2021    There are no preventive care reminders to display for this patient.  Lab Results  Component Value Date   TSH 2.910 03/09/2017   Lab Results  Component Value Date  WBC 6.0 02/15/2018   HGB 14.3 01/22/2021   HCT 42.0 01/22/2021   MCV 91.5 02/15/2018   PLT 164 02/15/2018   Lab Results  Component Value Date   NA 141 01/22/2021   K 3.2 (L) 01/22/2021   CO2 24 08/27/2020   GLUCOSE 153 (H) 01/22/2021   BUN 13 01/22/2021   CREATININE 1.50 (H) 01/22/2021   BILITOT 0.3 08/27/2020   ALKPHOS 78 08/27/2020   AST 19 08/27/2020   ALT 16 08/27/2020   PROT 7.2 08/27/2020   ALBUMIN 4.5 08/27/2020   CALCIUM 9.8 08/27/2020   ANIONGAP 12 02/15/2018   EGFR 42 (L) 08/27/2020   Lab Results  Component Value Date   CHOL 115 08/27/2020   Lab Results  Component Value Date   HDL 34 (L) 08/27/2020   Lab Results  Component Value Date   LDLCALC 56 08/27/2020   Lab Results  Component Value Date   TRIG 144 08/27/2020   Lab Results  Component Value Date   CHOLHDL 3.4 08/27/2020   Lab Results  Component Value Date   HGBA1C 7.3 (H) 08/27/2020      Assessment & Plan:   Problem List Items Addressed This Visit   None Visit Diagnoses     Flu-like symptoms    -  Primary   Relevant Medications   dextromethorphan-guaiFENesin (MUCINEX DM) 30-600 MG 12hr tablet   azelastine (ASTELIN) 0.1 % nasal spray   predniSONE (DELTASONE) 20 MG tablet   Other Relevant Orders   POCT Influenza A/B (Completed)   Viral URI       Relevant Medications   dextromethorphan-guaiFENesin (MUCINEX DM) 30-600 MG 12hr tablet   azelastine (ASTELIN) 0.1 % nasal spray   predniSONE (DELTASONE) 20 MG tablet       Meds ordered this encounter  Medications   dextromethorphan-guaiFENesin (MUCINEX DM) 30-600 MG 12hr tablet    Sig: Take 1 tablet by mouth 2 (two) times daily.    Dispense:  20 tablet    Refill:  0    Order Specific Question:   Supervising Provider     Answer:   Carlota Raspberry, JEFFREY R [[id number]]   azelastine (ASTELIN) 0.1 % nasal spray    Sig: Place 1 spray into both nostrils 2 (two) times daily. Use in each nostril as directed    Dispense:  30 mL    Refill:  12    Order Specific Question:   Supervising Provider    Answer:   Carlota Raspberry, JEFFREY R [[id number]]   predniSONE (DELTASONE) 20 MG tablet    Sig: Take 1 tablet (20 mg total) by mouth daily with breakfast.    Dispense:  5 tablet    Refill:  0    Order Specific Question:   Supervising Provider    Answer:   Carlota Raspberry, JEFFREY R [[id number]]    Follow-up: Return if symptoms worsen or fail to improve.   PLAN Viral URI Prednisone burst, mucinex dm, and azelastine Return if worsening or failing to improve Encourage hydration and rest. Patient encouraged to call clinic with any questions, comments, or concerns.   Jeffrey Coss, NP

## 2021-05-29 NOTE — Patient Instructions (Addendum)
Mr. Lamarque -   Fortunately, negative for flu today.  Use mucinex twice daily and azelastine once or twice daily for relief. Take prednisone once daily for 5 days. I have sent these to your pharmacy.  Use tylenol 1000mg  three times daily as needed for aches.  Do deep breathing exercises every hour to ensure your lung stay open.  If you are not feeling any better by Tuesday, call me   Thank you  Rich    If you have lab work done today you will be contacted with your lab results within the next 2 weeks.  If you have not heard from [place] then please contact us. The fastest way to get your results is to register for My Chart.   IF you received an x-ray today, you will receive an invoice from [facility]. Please contact [facility] at [phone] with questions or concerns regarding your invoice.   IF you received labwork today, you will receive an invoice from [facility]. Please contact [facility] at [phone] with questions or concerns regarding your invoice.   Our billing staff will not be able to assist you with questions regarding bills from these companies.  You will be contacted with the lab results as soon as they are available. The fastest way to get your results is to activate your My Chart account. Instructions are located on the last page of this paperwork. If you have not heard from [place] regarding the results in 2 weeks, please contact this office.

## 2021-06-08 ENCOUNTER — Other Ambulatory Visit: Payer: Self-pay | Admitting: Family Medicine

## 2021-06-08 DIAGNOSIS — M792 Neuralgia and neuritis, unspecified: Secondary | ICD-10-CM

## 2021-06-08 NOTE — Telephone Encounter (Signed)
Patient is requesting a refill of the following medications: Requested Prescriptions   Pending Prescriptions Disp Refills   pregabalin (LYRICA) 150 MG capsule [Pharmacy Med Name: PREGABALIN 150MG  CAPSULES] 90 capsule     Sig: TAKE 1 CAPSULE BY MOUTH THREE TIMES DAILY FOR NERVE PAIN    Date of patient request: 06/08/2021 Last office visit: 05/29/2021 Date of last refill: 04/27/2021 Last refill amount: 90 capsules Follow up time period per chart: 07/03/2021;

## 2021-06-09 NOTE — Telephone Encounter (Signed)
Controlled substance database reviewed, last filled November 14.  It appears that appointment is scheduled with my colleague in a few weeks.  If pregabalin is not discussed at that time will need office visit within the next month to 6 weeks with me to review this medication.

## 2021-07-03 ENCOUNTER — Encounter: Payer: Self-pay | Admitting: Registered Nurse

## 2021-07-03 ENCOUNTER — Telehealth: Payer: Self-pay

## 2021-07-03 ENCOUNTER — Ambulatory Visit (INDEPENDENT_AMBULATORY_CARE_PROVIDER_SITE_OTHER): Payer: BC Managed Care – PPO | Admitting: Registered Nurse

## 2021-07-03 VITALS — BP 140/70 | HR 77 | Temp 99.1°F | Ht 67.0 in | Wt 271.6 lb

## 2021-07-03 DIAGNOSIS — Z125 Encounter for screening for malignant neoplasm of prostate: Secondary | ICD-10-CM

## 2021-07-03 DIAGNOSIS — E118 Type 2 diabetes mellitus with unspecified complications: Secondary | ICD-10-CM | POA: Diagnosis not present

## 2021-07-03 DIAGNOSIS — R609 Edema, unspecified: Secondary | ICD-10-CM

## 2021-07-03 DIAGNOSIS — R351 Nocturia: Secondary | ICD-10-CM

## 2021-07-03 DIAGNOSIS — I10 Essential (primary) hypertension: Secondary | ICD-10-CM

## 2021-07-03 DIAGNOSIS — Z23 Encounter for immunization: Secondary | ICD-10-CM | POA: Diagnosis not present

## 2021-07-03 DIAGNOSIS — N529 Male erectile dysfunction, unspecified: Secondary | ICD-10-CM

## 2021-07-03 DIAGNOSIS — Z0000 Encounter for general adult medical examination without abnormal findings: Secondary | ICD-10-CM | POA: Diagnosis not present

## 2021-07-03 DIAGNOSIS — N528 Other male erectile dysfunction: Secondary | ICD-10-CM

## 2021-07-03 DIAGNOSIS — N183 Chronic kidney disease, stage 3 unspecified: Secondary | ICD-10-CM

## 2021-07-03 MED ORDER — TAMSULOSIN HCL 0.4 MG PO CAPS
0.4000 mg | ORAL_CAPSULE | Freq: Every day | ORAL | 3 refills | Status: DC
Start: 2021-07-03 — End: 2021-10-26

## 2021-07-03 NOTE — Patient Instructions (Signed)
Mr. Hard -   Let's try levitra for ED. Use as instructed  I will refer to cardiology for leg swelling and weight gain. They will call you  I recommend seeing urology if levitra does not help.  I will let you know how lab results look  Call me if you need anything  Thank you,  Rich

## 2021-07-03 NOTE — Telephone Encounter (Signed)
Pt is looking for new rx that was discussed today

## 2021-07-03 NOTE — Progress Notes (Signed)
Established Patient Office Visit  Subjective:  Patient ID: Jeffrey Parks, male    DOB: [birth year]  Age: 71 y.o. MRN: [id number]  CC:  Chief Complaint  Patient presents with   Annual Exam    HPI Jeffrey Parks presents for CPE  No acute concerns  Histories reviewed and updated with patient.   Hypertension: Patient Currently taking: losartan-hctz 100-32m po qd, carvedilol 3.1239mpo bid ac, hydralazine 5037mo tid, furosemide 35m63m qd  Good effect. No AEs. Denies CV symptoms including: chest pain, shob, doe, headache, visual changes, fatigue, claudication, and dependent edema.   Previous readings and labs: BP Readings from Last 3 Encounters:  07/03/21 140/70  05/29/21 (!) 158/85  01/23/21 (!) 185/96   Lab Results  Component Value Date   CREATININE 1.50 (H) 01/22/2021    T2dm Last A1c:  Lab Results  Component Value Date   HGBA1C 7.3 (H) 08/27/2020    Currently taking: metformin 500mg30mbid ac No new complications Reports good compliance with medications Diet has been steady Exercise habits have been limited  ED Ongoing. Has used tadalafil and sildenafil in the past without effect. Interested in options Trouble maintaining erection. No hematuria or hematospermia. No pain in penis or testicles. Has not seen urology for over one year  Leg swelling Ongoing. Seems to be worsening gradually in the past few months. He does state that he seems to be gaining weight as well, despite steady diet and no changes to activity. Denies any changes to breathing such as shob, doe.  Has been some time since he has seen cardiology. Would like to be seen again.   Nocturia Frequent. Ongoing. 2-3 times per night minimum Does note starting and stopping of stream. Sometimes difficult to start stream No dysuria or hematuria.   Past Medical History:  Diagnosis Date   Hypertension    Low testosterone    Sickle cell trait ([facility] Past Surgical History:   Procedure Laterality Date   neck surgery, Dec. 14,2018     None      Family History  Problem Relation Age of Onset   Hypertension Mother    Diabetes Other    Sickle cell anemia Son    Sickle cell anemia Son     Social History   Socioeconomic History   Marital status: Single    Spouse name: Not on file   Number of children: Not on file   Years of education: Not on file   Highest education level: Not on file  Occupational History   Not on file  Tobacco Use   Smoking status: Former    Types: Cigarettes   Smokeless tobacco: Never   Tobacco comments:    Quit two years ago.  Light smoker prior.   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 30 tablet 0   atorvastatin (LIPITOR) 10 MG tablet TAKE 1 TABLET(10 MG) BY MOUTH DAILY 90 tablet 2   azelastine (ASTELIN) 0.1 % nasal spray Place 1 spray into both nostrils 2 (two) times daily. Use in each nostril as directed 30 mL 12   benzonatate (TESSALON) 100 MG capsule Take 1 capsule (100 mg total) by mouth 3 (three) times daily as needed for cough. 15 capsule 0    bisacodyl (DULCOLAX) 5 MG EC tablet Take 1 tablet (5 mg total) by mouth daily as needed for moderate constipation. 30 tablet 0   carvedilol (COREG) 3.125 MG tablet Take 1 tablet (3.125 mg total) by mouth 2 (two) times daily with a meal. 60 tablet 3   dextromethorphan-guaiFENesin (MUCINEX DM) 30-600 MG 12hr tablet Take 1 tablet by mouth 2 (two) times daily. 20 tablet 0   furosemide (LASIX) 40 MG tablet Take 2 tablets (80 mg total) by mouth daily. 180 tablet 1   hydrALAZINE (APRESOLINE) 50 MG tablet Take 1 tablet (50 mg total) by mouth 3 (three) times daily. 270 tablet 1   hydrocortisone (ANUSOL-HC) 25 MG suppository Place 1 suppository (25 mg total) rectally 2 (two) times daily as needed for hemorrhoids or anal itching. 12 suppository 1   losartan-hydrochlorothiazide (HYZAAR) 100-25 MG tablet TAKE 1 TABLET BY MOUTH DAILY 90 tablet 1   metFORMIN (GLUCOPHAGE) 500 MG tablet Take 1 tablet (500 mg total) by mouth 2 (two) times daily with a meal. 180 tablet 3   polyethylene glycol (MIRALAX / GLYCOLAX) 17 g packet Take 17 g by mouth daily. 28 each 0   predniSONE (DELTASONE) 20 MG tablet Take 1 tablet (20 mg total) by mouth daily with breakfast. 5 tablet 0   pregabalin (LYRICA) 150 MG capsule TAKE 1 CAPSULE BY MOUTH THREE TIMES DAILY FOR NERVE PAIN 90 capsule 0   Sennosides-Docusate Sodium (SENNA-DOCUSATE SODIUM PO) Take by mouth.     latanoprost (XALATAN) 0.005 % ophthalmic solution SMARTSIG:1 Drop(s) In Eye(s) Every Evening     No facility-administered medications prior to visit.    Allergies  Allergen Reactions   Norvasc [Amlodipine]     ROS Review of Systems  Constitutional: Negative.   HENT: Negative.    Eyes: Negative.   Respiratory: Negative.    Cardiovascular: Negative.   Gastrointestinal: Negative.   Genitourinary: Negative.   Musculoskeletal: Negative.   Skin: Negative.   Neurological: Negative.   Psychiatric/Behavioral: Negative.    All other systems reviewed and are  negative.    Objective:    Physical Exam Vitals and nursing note reviewed.  Constitutional:      General: He is not in acute distress.    Appearance: Normal appearance. He is obese. He is not ill-appearing, toxic-appearing or diaphoretic.  HENT:     Head: Normocephalic and atraumatic.     Right Ear: Tympanic membrane, ear canal and external ear normal. There is no impacted cerumen.     Left Ear: Tympanic membrane, ear canal and external ear normal. There is no impacted cerumen.     Nose: Nose normal. No congestion or rhinorrhea.     Mouth/Throat:  Mouth: Mucous membranes are moist.     Pharynx: Oropharynx is clear. No oropharyngeal exudate or posterior oropharyngeal erythema.  Eyes:     General: No scleral icterus.       Right eye: No discharge.        Left eye: No discharge.     Extraocular Movements: Extraocular movements intact.     Conjunctiva/sclera: Conjunctivae normal.     Pupils: Pupils are equal, round, and reactive to light.  Neck:     Vascular: No carotid bruit.  Cardiovascular:     Rate and Rhythm: Normal rate and regular rhythm.     Pulses: Normal pulses.     Heart sounds: Normal heart sounds. No murmur heard.   No friction rub. No gallop.     Comments: Distant heart sounds, question if due to body habitus vs. Cardiac pathology Pulmonary:     Effort: Pulmonary effort is normal. No respiratory distress.     Breath sounds: Normal breath sounds. No stridor. No wheezing, rhonchi or rales.  Chest:     Chest wall: No tenderness.  Abdominal:     General: Abdomen is flat. Bowel sounds are normal. There is no distension.     Palpations: Abdomen is soft. There is no mass.     Tenderness: There is no abdominal tenderness. There is no right CVA tenderness, left CVA tenderness, guarding or rebound.     Hernia: No hernia is present.     Comments: Exam limited by body habitus  Musculoskeletal:        General: No swelling, tenderness, deformity or signs of injury. Normal  range of motion.     Cervical back: Normal range of motion and neck supple. No rigidity or tenderness.     Right lower leg: Edema (+2 pitting) present.     Left lower leg: Edema (+2 pitting) present.  Lymphadenopathy:     Cervical: No cervical adenopathy.  Skin:    General: Skin is warm and dry.     Capillary Refill: Capillary refill takes less than 2 seconds.     Coloration: Skin is not jaundiced or pale.     Findings: No bruising, erythema, lesion or rash.  Neurological:     General: No focal deficit present.     Mental Status: He is alert and oriented to person, place, and time. Mental status is at baseline.     Cranial Nerves: No cranial nerve deficit.     Motor: No weakness.     Gait: Gait normal.  Psychiatric:        Mood and Affect: Mood normal.        Behavior: Behavior normal.        Thought Content: Thought content normal.        Judgment: Judgment normal.    BP 140/70 (BP Location: Left Arm, Patient Position: Sitting, Cuff Size: Normal)    Pulse 77    Temp 99.1 F (37.3 C) (Oral)    Ht 5' 7"  (1.702 m)    Wt 271 lb 9.6 oz (123.2 kg)    SpO2 97%    BMI 42.54 kg/m  Wt Readings from Last 3 Encounters:  07/03/21 271 lb 9.6 oz (123.2 kg)  05/29/21 257 lb 3.2 oz (116.7 kg)  10/22/20 267 lb 6.4 oz (121.3 kg)     Health Maintenance Due  Topic Date Due   Zoster Vaccines- Shingrix (1 of 2) Never done   OPHTHALMOLOGY EXAM  01/03/2020   COVID-19 Vaccine (4 - Booster  for Moderna series) 07/24/2020   HEMOGLOBIN A1C  02/27/2021    There are no preventive care reminders to display for this patient.  Lab Results  Component Value Date   TSH 2.910 03/09/2017   Lab Results  Component Value Date   WBC 6.0 02/15/2018   HGB 14.3 01/22/2021   HCT 42.0 01/22/2021   MCV 91.5 02/15/2018   PLT 164 02/15/2018   Lab Results  Component Value Date   NA 141 01/22/2021   K 3.2 (L) 01/22/2021   CO2 24 08/27/2020   GLUCOSE 153 (H) 01/22/2021   BUN 13 01/22/2021   CREATININE 1.50  (H) 01/22/2021   BILITOT 0.3 08/27/2020   ALKPHOS 78 08/27/2020   AST 19 08/27/2020   ALT 16 08/27/2020   PROT 7.2 08/27/2020   ALBUMIN 4.5 08/27/2020   CALCIUM 9.8 08/27/2020   ANIONGAP 12 02/15/2018   EGFR 42 (L) 08/27/2020   Lab Results  Component Value Date   CHOL 115 08/27/2020   Lab Results  Component Value Date   HDL 34 (L) 08/27/2020   Lab Results  Component Value Date   LDLCALC 56 08/27/2020   Lab Results  Component Value Date   TRIG 144 08/27/2020   Lab Results  Component Value Date   CHOLHDL 3.4 08/27/2020   Lab Results  Component Value Date   HGBA1C 7.3 (H) 08/27/2020      Assessment & Plan:   Problem List Items Addressed This Visit       Cardiovascular and Mediastinum   Essential hypertension   Relevant Medications   vardenafil (LEVITRA) 5 MG tablet   Other Relevant Orders   Urinalysis, Routine w reflex microscopic (Completed)   CBC with Differential/Platelet (Completed)   Comprehensive metabolic panel (Completed)   Hemoglobin A1c (Completed)   TSH (Completed)     Endocrine   Type 2 diabetes mellitus with complication (HCC)   Relevant Orders   Lipid panel (Completed)     Genitourinary   Chronic kidney disease, stage III (moderate) (HCC)     Other   Erectile dysfunction   Relevant Medications   vardenafil (LEVITRA) 5 MG tablet   Morbid obesity due to excess calories ([place])   Relevant Orders   Ambulatory referral to Cardiology   Other Visit Diagnoses     Annual physical exam    -  Primary   Flu vaccine need       Need for immunization against influenza       Relevant Orders   Flu Vaccine QUAD High Dose(Fluad) (Completed)   Need for shingles vaccine       Relevant Orders   Varicella-zoster vaccine IM (Shingrix) (Completed)   Screening PSA (prostate specific antigen)       Relevant Orders   PSA (Completed)   Nocturia       Relevant Medications   tamsulosin (FLOMAX) 0.4 MG CAPS capsule   Other Relevant Orders   PSA  (Completed)   Dependent edema       Relevant Orders   Ambulatory referral to Cardiology       No orders of the defined types were placed in this encounter.   Follow-up: Return in about 6 months (around 12/31/2021) for w/ PCP - chronic conditions.   PLAN Exam unremarkable Labs collected. Will follow up with the patient as warranted. Flu vaccine given Refer to ophthalmology for eye exam t2dm Refer to cardiology for worsening DOE, dependent edema Shingles vaccine #1 given Start levitra 74m po qd prn.  If no effect, see urology. Can place referral without follow up visit if pt needs. Start flomax 0.55m po qd for nocturia. Monitor effect. Will check PSA.  Patient encouraged to call clinic with any questions, comments, or concerns.  RMaximiano Coss NP

## 2021-07-03 NOTE — Telephone Encounter (Signed)
Caller name:Rony Hirsch   On DPR? :Yes  Call back [phone]  Provider they see: Richard  Reason for call:Richard forgot to send medication for Levitra this is new medication that Delfino Lovett put him on it today but was never sent the pharmacy   Pt uses [facility] on [address]

## 2021-07-04 LAB — COMPREHENSIVE METABOLIC PANEL
AG Ratio: 1.5 (calc) (ref 1.0–2.5)
ALT: 18 U/L (ref 9–46)
AST: 20 U/L (ref 10–35)
Albumin: 4 g/dL (ref 3.6–5.1)
Alkaline phosphatase (APISO): 83 U/L (ref 35–144)
BUN/Creatinine Ratio: 11 (calc) (ref 6–22)
BUN: 18 mg/dL (ref 7–25)
CO2: 28 mmol/L (ref 20–32)
Calcium: 9.5 mg/dL (ref 8.6–10.3)
Chloride: 103 mmol/L (ref 98–110)
Creat: 1.57 mg/dL — ABNORMAL HIGH (ref 0.70–1.35)
Globulin: 2.6 g/dL (calc) (ref 1.9–3.7)
Glucose, Bld: 96 mg/dL (ref 65–99)
Potassium: 3.7 mmol/L (ref 3.5–5.3)
Sodium: 141 mmol/L (ref 135–146)
Total Bilirubin: 0.5 mg/dL (ref 0.2–1.2)
Total Protein: 6.6 g/dL (ref 6.1–8.1)

## 2021-07-04 LAB — URINALYSIS, ROUTINE W REFLEX MICROSCOPIC
Bacteria, UA: NONE SEEN /HPF
Bilirubin Urine: NEGATIVE
Glucose, UA: NEGATIVE
Hgb urine dipstick: NEGATIVE
Hyaline Cast: NONE SEEN /LPF
Ketones, ur: NEGATIVE
Leukocytes,Ua: NEGATIVE
Nitrite: NEGATIVE
RBC / HPF: NONE SEEN /HPF (ref 0–2)
Specific Gravity, Urine: 1.014 (ref 1.001–1.035)
Squamous Epithelial / HPF: NONE SEEN /HPF (ref <=5)
WBC, UA: NONE SEEN /HPF (ref 0–5)
pH: 7 (ref 5.0–8.0)

## 2021-07-04 LAB — CBC WITH DIFFERENTIAL/PLATELET
Absolute Monocytes: 459 cells/uL (ref 200–950)
Basophils Absolute: 39 cells/uL (ref 0–200)
Basophils Relative: 0.7 %
Eosinophils Absolute: 151 cells/uL (ref 15–500)
Eosinophils Relative: 2.7 %
HCT: 41.3 % (ref 38.5–50.0)
Hemoglobin: 14.3 g/dL (ref 13.2–17.1)
Lymphs Abs: 2587 cells/uL (ref 850–3900)
MCH: 31.2 pg (ref 27.0–33.0)
MCHC: 34.6 g/dL (ref 32.0–36.0)
MCV: 90.2 fL (ref 80.0–100.0)
MPV: 11.8 fL (ref 7.5–12.5)
Monocytes Relative: 8.2 %
Neutro Abs: 2363 cells/uL (ref 1500–7800)
Neutrophils Relative %: 42.2 %
Platelets: 177 10*3/uL (ref 140–400)
RBC: 4.58 10*6/uL (ref 4.20–5.80)
RDW: 13.7 % (ref 11.0–15.0)
Total Lymphocyte: 46.2 %
WBC: 5.6 10*3/uL (ref 3.8–10.8)

## 2021-07-04 LAB — LIPID PANEL
Cholesterol: 149 mg/dL (ref <200)
HDL: 33 mg/dL — ABNORMAL LOW (ref >=40)
LDL Cholesterol (Calc): 93 mg/dL (calc)
Non-HDL Cholesterol (Calc): 116 mg/dL (calc) (ref <130)
Total CHOL/HDL Ratio: 4.5 (calc) (ref <5.0)
Triglycerides: 134 mg/dL (ref <150)

## 2021-07-04 LAB — HEMOGLOBIN A1C
Hgb A1c MFr Bld: 6.4 % of total Hgb — ABNORMAL HIGH (ref <5.7)
Mean Plasma Glucose: 137 mg/dL
eAG (mmol/L): 7.6 mmol/L

## 2021-07-04 LAB — MICROSCOPIC MESSAGE

## 2021-07-04 LAB — EXTRA URINE SPECIMEN

## 2021-07-04 LAB — TSH: TSH: 1.97 mIU/L (ref 0.40–4.50)

## 2021-07-04 LAB — PSA: PSA: 0.46 ng/mL (ref <=4.00)

## 2021-07-04 MED ORDER — VARDENAFIL HCL 5 MG PO TABS: 5.0000 mg | ORAL_TABLET | ORAL | 0 refills | Status: DC | PRN

## 2021-07-06 NOTE — Telephone Encounter (Signed)
Addressed  Thanks,  Denice Paradise

## 2021-07-28 ENCOUNTER — Other Ambulatory Visit: Payer: Self-pay | Admitting: Family Medicine

## 2021-07-28 DIAGNOSIS — M792 Neuralgia and neuritis, unspecified: Secondary | ICD-10-CM

## 2021-07-29 NOTE — Telephone Encounter (Signed)
Patient is requesting a refill of the following medications: Requested Prescriptions   Pending Prescriptions Disp Refills   pregabalin (LYRICA) 150 MG capsule [Pharmacy Med Name: PREGABALIN 150MG  CAPSULES] 90 capsule     Sig: TAKE 1 CAPSULE BY MOUTH THREE TIMES DAILY FOR NERVE PAIN    Date of patient request: 07/28/2021 Last office visit: 07/03/21 Date of last refill: 06/09/21 Last refill amount: 90

## 2021-08-10 ENCOUNTER — Other Ambulatory Visit: Payer: Self-pay | Admitting: Family Medicine

## 2021-08-10 DIAGNOSIS — I10 Essential (primary) hypertension: Secondary | ICD-10-CM

## 2021-08-20 ENCOUNTER — Telehealth: Payer: Self-pay

## 2021-08-20 NOTE — Telephone Encounter (Signed)
Error

## 2021-08-21 ENCOUNTER — Encounter: Payer: Self-pay | Admitting: Registered Nurse

## 2021-08-21 ENCOUNTER — Other Ambulatory Visit: Payer: Self-pay

## 2021-08-21 ENCOUNTER — Ambulatory Visit (INDEPENDENT_AMBULATORY_CARE_PROVIDER_SITE_OTHER): Payer: BC Managed Care – PPO | Admitting: Registered Nurse

## 2021-08-21 VITALS — BP 144/83 | HR 83 | Temp 97.9°F | Resp 18 | Ht 67.0 in | Wt 267.6 lb

## 2021-08-21 DIAGNOSIS — I10 Essential (primary) hypertension: Secondary | ICD-10-CM

## 2021-08-21 DIAGNOSIS — R5382 Chronic fatigue, unspecified: Secondary | ICD-10-CM | POA: Diagnosis not present

## 2021-08-21 DIAGNOSIS — R7989 Other specified abnormal findings of blood chemistry: Secondary | ICD-10-CM

## 2021-08-21 DIAGNOSIS — R609 Edema, unspecified: Secondary | ICD-10-CM | POA: Diagnosis not present

## 2021-08-21 DIAGNOSIS — E1122 Type 2 diabetes mellitus with diabetic chronic kidney disease: Secondary | ICD-10-CM

## 2021-08-21 NOTE — Patient Instructions (Addendum)
Mr. Jeffrey Parks -  ? ?Great to see you ? ?Let's check on labs.  ? ?I know that your testosterone and libido is important to you - but we need to make sure your heart is safe.  ? ?Please keep your appointment with Dr. Harrell Gave on 3/20 ? ?I will let you know how labs look ? ?Thank you ? ?Jeffrey Parks  ? ? ? ?If you have lab work done today you will be contacted with your lab results within the next 2 weeks.  If you have not heard from [place] then please contact us. The fastest way to get your results is to register for My Chart. ? ? ?IF you received an x-ray today, you will receive an invoice from [facility]. Please contact [facility] at [phone] with questions or concerns regarding your invoice.  ? ?IF you received labwork today, you will receive an invoice from [facility]. Please contact [facility] at [phone] with questions or concerns regarding your invoice.  ? ?Our billing staff will not be able to assist you with questions regarding bills from these companies. ? ?You will be contacted with the lab results as soon as they are available. The fastest way to get your results is to activate your My Chart account. Instructions are located on the last page of this paperwork. If you have not heard from [place] regarding the results in 2 weeks, please contact this office. ?  ? ? ?

## 2021-08-21 NOTE — Progress Notes (Signed)
Established Patient Office Visit  Subjective:  Patient ID: Jeffrey Parks, male    DOB: 07/23/1950  Age: 71 y.o. MRN: [id number]  CC:  Chief Complaint  Patient presents with   Medication Refill    Patient states he is here for a medication refill and and dis cuss discuss fatigue for a while that has gotten worse. Patient states he would like to discuss Clomiphene and increase     HPI [name] presents for medication refill and fatigue.  Fatigue has been chronic for months, but gotten worse over the past few weeks. Some doe, shob, dependent edema. I had seen him in January and referred him to cardiology, he has upcoming appt with Dr. Hughie Closs on 08/31/21.  No changes to diet, exercise, or other lifestyle changes.  He is very focused on his testosterone levels and the extent to which these may be affecting his health. He notes that he suspects it is affecting his ED and libido.   Past Medical History:  Diagnosis Date   Hypertension    Low testosterone    Sickle cell trait [facility])     Past Surgical History:  Procedure Laterality Date   neck surgery, Dec. 14,2018     None      Family History  Problem Relation Age of Onset   Hypertension Mother    Diabetes Other    Sickle cell anemia Son    Sickle cell anemia Son     Social History   Socioeconomic History   Marital status: Single    Spouse name: Not on file   Number of children: Not on file   Years of education: Not on file   Highest education level: Not on file  Occupational History   Not on file  Tobacco Use   Smoking status: Former    Types: Cigarettes   Smokeless tobacco: Never   Tobacco comments:    Quit two years ago.  Light smoker prior.   Vaping Use   Vaping Use: Never used  Substance and Sexual Activity   Alcohol use: Yes    Alcohol/week: 3.0 standard drinks    Types: 3 Standard drinks or equivalent per week   Drug use: No   Sexual activity: Not on file  Other Topics Concern   Not  on file  Social History Narrative   Lives with wife, 2 adult children and 3 grandchildren   Has 2 deceased children - 4 children total   Has PhD in literature   Professor at [employer]   Social Determinants of Corporate investment banker Strain: Not on file  Food Insecurity: Not on file  Transportation Needs: Not on file  Physical Activity: Not on file  Stress: Not on file  Social Connections: Not on file  Intimate Partner Violence: Not on file    Outpatient Medications Prior to Visit  Medication Sig Dispense Refill   latanoprost (XALATAN) 0.005 % ophthalmic solution SMARTSIG:1 Drop(s) In Eye(s) Every Evening     losartan-hydrochlorothiazide (HYZAAR) 100-25 MG tablet TAKE 1 TABLET BY MOUTH DAILY 90 tablet 1   Sennosides-Docusate Sodium (SENNA-DOCUSATE SODIUM PO) Take by mouth.     tamsulosin (FLOMAX) 0.4 MG CAPS capsule Take 1 capsule (0.4 mg total) by mouth daily. 30 capsule 3   hydrocortisone (ANUSOL-HC) 25 MG suppository Place 1 suppository (25 mg total) rectally 2 (two) times daily as needed for hemorrhoids or anal itching. 12 suppository 1   metFORMIN (GLUCOPHAGE) 500 MG tablet Take 1 tablet (  500 mg total) by mouth 2 (two) times daily with a meal. 180 tablet 3   pregabalin (LYRICA) 150 MG capsule TAKE 1 CAPSULE BY MOUTH THREE TIMES DAILY FOR NERVE PAIN 90 capsule 0   furosemide (LASIX) 40 MG tablet Take 2 tablets (80 mg total) by mouth daily. (Patient not taking: Reported on 08/21/2021) 180 tablet 1   vardenafil (LEVITRA) 5 MG tablet Take 1 tablet (5 mg total) by mouth as needed for erectile dysfunction. (Patient not taking: Reported on 08/31/2021) 10 tablet 0   acetaminophen (TYLENOL ARTHRITIS PAIN) 650 MG CR tablet Take 1 tablet (650 mg total) by mouth every 6 (six) hours as needed for pain. (Patient not taking: Reported on 08/21/2021) 30 tablet 0   atorvastatin (LIPITOR) 10 MG tablet TAKE 1 TABLET(10 MG) BY MOUTH DAILY (Patient not taking: Reported on 08/21/2021) 90 tablet 2    azelastine (ASTELIN) 0.1 % nasal spray Place 1 spray into both nostrils 2 (two) times daily. Use in each nostril as directed 30 mL 12   benzonatate (TESSALON) 100 MG capsule Take 1 capsule (100 mg total) by mouth 3 (three) times daily as needed for cough. (Patient not taking: Reported on 08/21/2021) 15 capsule 0   bisacodyl (DULCOLAX) 5 MG EC tablet Take 1 tablet (5 mg total) by mouth daily as needed for moderate constipation. (Patient not taking: Reported on 08/21/2021) 30 tablet 0   carvedilol (COREG) 3.125 MG tablet Take 1 tablet (3.125 mg total) by mouth 2 (two) times daily with a meal. (Patient not taking: Reported on 08/31/2021) 60 tablet 3   dextromethorphan-guaiFENesin (MUCINEX DM) 30-600 MG 12hr tablet Take 1 tablet by mouth 2 (two) times daily. (Patient not taking: Reported on 08/21/2021) 20 tablet 0   hydrALAZINE (APRESOLINE) 50 MG tablet Take 1 tablet (50 mg total) by mouth 3 (three) times daily. (Patient not taking: Reported on 08/21/2021) 270 tablet 1   polyethylene glycol (MIRALAX / GLYCOLAX) 17 g packet Take 17 g by mouth daily. 28 each 0   predniSONE (DELTASONE) 20 MG tablet Take 1 tablet (20 mg total) by mouth daily with breakfast. 5 tablet 0   No facility-administered medications prior to visit.    Allergies  Allergen Reactions   Norvasc [Amlodipine]     ROS Review of Systems  Constitutional: Negative.   HENT: Negative.    Eyes: Negative.   Respiratory: Negative.    Cardiovascular: Negative.   Gastrointestinal: Negative.   Genitourinary: Negative.   Musculoskeletal: Negative.   Skin: Negative.   Neurological: Negative.   Psychiatric/Behavioral: Negative.    All other systems reviewed and are negative.    Objective:    Physical Exam Constitutional:      General: He is not in acute distress.    Appearance: Normal appearance. He is normal weight. He is not ill-appearing, toxic-appearing or diaphoretic.  Cardiovascular:     Rate and Rhythm: Normal rate and regular  rhythm.     Heart sounds: Normal heart sounds. No murmur heard.   No friction rub. No gallop.  Pulmonary:     Effort: Pulmonary effort is normal. No respiratory distress.     Breath sounds: Normal breath sounds. No stridor. No wheezing, rhonchi or rales.  Chest:     Chest wall: No tenderness.  Musculoskeletal:     Right lower leg: Edema (+2 pitting) present.     Left lower leg: Edema (+2 pitting) present.  Neurological:     General: No focal deficit present.     Mental  Status: He is alert and oriented to person, place, and time. Mental status is at baseline.  Psychiatric:        Mood and Affect: Mood normal.        Behavior: Behavior normal.        Thought Content: Thought content normal.        Judgment: Judgment normal.    BP (!) 144/83   Pulse 83   Temp 97.9 F (36.6 C) (Temporal)   Resp 18   Ht 5\' 7"  (1.702 m)   Wt 267 lb 9.6 oz (121.4 kg)   SpO2 98%   BMI 41.91 kg/m  Wt Readings from Last 3 Encounters:  08/31/21 275 lb 1.6 oz (124.8 kg)  08/21/21 267 lb 9.6 oz (121.4 kg)  07/03/21 271 lb 9.6 oz (123.2 kg)     Health Maintenance Due  Topic Date Due   TETANUS/TDAP  Never done   OPHTHALMOLOGY EXAM  01/03/2020   COVID-19 Vaccine (4 - Booster for Moderna series) 07/24/2020   FOOT EXAM  08/27/2021   Zoster Vaccines- Shingrix (2 of 2) 08/28/2021    There are no preventive care reminders to display for this patient.  Lab Results  Component Value Date   TSH 1.94 08/21/2021   Lab Results  Component Value Date   WBC 7.5 08/21/2021   HGB 13.9 08/21/2021   HCT 41.8 08/21/2021   MCV 89.9 08/21/2021   PLT 203 08/21/2021   Lab Results  Component Value Date   NA 140 08/21/2021   K 3.7 08/21/2021   CO2 31 08/21/2021   GLUCOSE 97 08/21/2021   BUN 18 08/21/2021   CREATININE 1.47 (H) 08/21/2021   BILITOT 0.5 08/21/2021   ALKPHOS 78 08/27/2020   AST 16 08/21/2021   ALT 16 08/21/2021   PROT 6.7 08/21/2021   ALBUMIN 4.5 08/27/2020   CALCIUM 9.3 08/21/2021    ANIONGAP 12 02/15/2018   EGFR 42 (L) 08/27/2020   Lab Results  Component Value Date   CHOL 147 08/21/2021   Lab Results  Component Value Date   HDL 40 08/21/2021   Lab Results  Component Value Date   LDLCALC 86 08/21/2021   Lab Results  Component Value Date   TRIG 110 08/21/2021   Lab Results  Component Value Date   CHOLHDL 3.7 08/21/2021   Lab Results  Component Value Date   HGBA1C 6.4 (H) 07/03/2021      Assessment & Plan:   Problem List Items Addressed This Visit       Cardiovascular and Mediastinum   Essential hypertension     Other   Morbid obesity due to excess calories (HCC)   Fatigue - Primary   Relevant Orders   EKG 12-Lead (Completed)   Testosterone (Completed)   Comprehensive metabolic panel (Completed)   CBC with Differential/Platelet (Completed)   Lipid panel (Completed)   TSH (Completed)   T4, free (Completed)   Other Visit Diagnoses     Dependent edema       Relevant Orders   EKG 12-Lead (Completed)   Testosterone (Completed)   Comprehensive metabolic panel (Completed)   CBC with Differential/Platelet (Completed)   Lipid panel (Completed)   TSH (Completed)   T4, free (Completed)   Low testosterone       Type 2 diabetes mellitus with chronic kidney disease, without long-term current use of insulin, unspecified CKD stage (HCC)           No orders of the defined types were placed  in this encounter.   Follow-up: Return if symptoms worsen or fail to improve.   PLAN Will recheck labs per pt but discussed testosterone supplementation safety given his multiple uncontrolled conditions and uncertainty on his cardiac health. Will need to see cardiology to rule out cardiac origins of his ongoing fatigue before we can consider referral to endocrinology. EKG obtained. Compared to 05/03/2016. New apparent LVH. Otherwise RRR no acute changes.  Patient encouraged to call clinic with any questions, comments, or concerns.  Janeece Agee, NP

## 2021-08-22 LAB — CBC WITH DIFFERENTIAL/PLATELET
Absolute Monocytes: 540 cells/uL (ref 200–950)
Basophils Absolute: 30 cells/uL (ref 0–200)
Basophils Relative: 0.4 %
Eosinophils Absolute: 158 cells/uL (ref 15–500)
Eosinophils Relative: 2.1 %
HCT: 41.8 % (ref 38.5–50.0)
Hemoglobin: 13.9 g/dL (ref 13.2–17.1)
Lymphs Abs: 2738 cells/uL (ref 850–3900)
MCH: 29.9 pg (ref 27.0–33.0)
MCHC: 33.3 g/dL (ref 32.0–36.0)
MCV: 89.9 fL (ref 80.0–100.0)
MPV: 11.6 fL (ref 7.5–12.5)
Monocytes Relative: 7.2 %
Neutro Abs: 4035 cells/uL (ref 1500–7800)
Neutrophils Relative %: 53.8 %
Platelets: 203 10*3/uL (ref 140–400)
RBC: 4.65 10*6/uL (ref 4.20–5.80)
RDW: 13.4 % (ref 11.0–15.0)
Total Lymphocyte: 36.5 %
WBC: 7.5 10*3/uL (ref 3.8–10.8)

## 2021-08-22 LAB — COMPREHENSIVE METABOLIC PANEL
AG Ratio: 1.5 (calc) (ref 1.0–2.5)
ALT: 16 U/L (ref 9–46)
AST: 16 U/L (ref 10–35)
Albumin: 4 g/dL (ref 3.6–5.1)
Alkaline phosphatase (APISO): 76 U/L (ref 35–144)
BUN/Creatinine Ratio: 12 (calc) (ref 6–22)
BUN: 18 mg/dL (ref 7–25)
CO2: 31 mmol/L (ref 20–32)
Calcium: 9.3 mg/dL (ref 8.6–10.3)
Chloride: 100 mmol/L (ref 98–110)
Creat: 1.47 mg/dL — ABNORMAL HIGH (ref 0.70–1.35)
Globulin: 2.7 g/dL (calc) (ref 1.9–3.7)
Glucose, Bld: 97 mg/dL (ref 65–99)
Potassium: 3.7 mmol/L (ref 3.5–5.3)
Sodium: 140 mmol/L (ref 135–146)
Total Bilirubin: 0.5 mg/dL (ref 0.2–1.2)
Total Protein: 6.7 g/dL (ref 6.1–8.1)

## 2021-08-22 LAB — LIPID PANEL
Cholesterol: 147 mg/dL (ref <200)
HDL: 40 mg/dL (ref >=40)
LDL Cholesterol (Calc): 86 mg/dL (calc)
Non-HDL Cholesterol (Calc): 107 mg/dL (calc) (ref <130)
Total CHOL/HDL Ratio: 3.7 (calc) (ref <5.0)
Triglycerides: 110 mg/dL (ref <150)

## 2021-08-22 LAB — T4, FREE: Free T4: 1.2 ng/dL (ref 0.8–1.8)

## 2021-08-22 LAB — TSH: TSH: 1.94 mIU/L (ref 0.40–4.50)

## 2021-08-22 LAB — TESTOSTERONE: Testosterone: 168 ng/dL — ABNORMAL LOW (ref 250–827)

## 2021-08-28 NOTE — Progress Notes (Signed)
?Cardiology Office Note:   ? ?Date:  08/31/2021  ? ?ID:  DOCTOR SHEAHAN, DOB 1950-12-25, MRN [id number] ? ?PCP:  Wendie Agreste, MD  ?Cardiologist:  Buford Dresser, MD ? ?Referring MD: Maximiano Coss, NP  ? ?CC; new patient evaluation for lower extremity edema ? ?History of Present Illness:   ? ?Jeffrey Parks is a 72 y.o. male with a hx of hypertension, CKD III, type 2 diabetes mellitus, and sickle cell trait, who is seen as a new consult at the request of Maximiano Coss, NP for the evaluation and management of obesity and dependent edema. ? ?CV history: Last seen in cardiology 09/08/2016 by Dr. Percival Spanish. He saw Maximiano Coss, NP on 07/03/2021. He was referred to cardiology for worsening weight gain and dependent edema. He followed up with Maximiano Coss, NP 08/21/2021. His chronic fatigue was worsening in the few weeks prior, with some DOE. ? ?Today he is accompanied by his wife. ? ?Cardiovascular risk factors: ?Prior clinical ASCVD: None. ?Comorbid conditions: Hypertension. Type 2 Diabetes. ?Metabolic syndrome/Obesity: Lately his weight has been fluctuating. About 240-250 lbs is a typical range for him. His highest adult weight is 270. ?Exercise level: In the past few months, his most strenuous activity has been climbing the stairs 3-4 times daily at home. ?Current diet: Rice, some fried foods, vegetables from time to time. Usually he tries to eat a balanced diet, but enjoys fried foods. ? ?Overall, he is mostly concerned about his constant fatigue. He denies any recent issues with chest pain or shortness of breath. He would like to make sure that his heart is ok to start testosterone supplementation. ? ?His LE edema has been ongoing for some time. Sometimes his swelling improves, at its worst when he sits for a long time. Since his surgery his RLE has "not been functioning well." He notes that his current level of swelling in clinic today is somewhat usual for him. At home he does have compression  socks but he normally does not wear them. ? ?He denies any palpitations, lightheadedness, headaches, syncope, orthopnea, or PND. ? ?Past Medical History:  ?Diagnosis Date  ? Hypertension   ? Low testosterone   ? Sickle cell trait ([place])   ? ? ?Past Surgical History:  ?Procedure Laterality Date  ? neck surgery, Dec. 14,2018    ? None    ? ? ?Current Medications: ?Current Outpatient Medications on File Prior to Visit  ?Medication Sig  ? latanoprost (XALATAN) 0.005 % ophthalmic solution SMARTSIG:1 Drop(s) In Eye(s) Every Evening  ? losartan-hydrochlorothiazide (HYZAAR) 100-25 MG tablet TAKE 1 TABLET BY MOUTH DAILY  ? metFORMIN (GLUCOPHAGE) 500 MG tablet Take 1 tablet (500 mg total) by mouth 2 (two) times daily with a meal.  ? pregabalin (LYRICA) 150 MG capsule TAKE 1 CAPSULE BY MOUTH THREE TIMES DAILY FOR NERVE PAIN  ? Sennosides-Docusate Sodium (SENNA-DOCUSATE SODIUM PO) Take by mouth.  ? tamsulosin (FLOMAX) 0.4 MG CAPS capsule Take 1 capsule (0.4 mg total) by mouth daily.  ? atorvastatin (LIPITOR) 10 MG tablet TAKE 1 TABLET(10 MG) BY MOUTH DAILY (Patient not taking: Reported on 08/21/2021)  ? carvedilol (COREG) 3.125 MG tablet Take 1 tablet (3.125 mg total) by mouth 2 (two) times daily with a meal. (Patient not taking: Reported on 08/31/2021)  ? furosemide (LASIX) 40 MG tablet Take 2 tablets (80 mg total) by mouth daily. (Patient not taking: Reported on 08/21/2021)  ? vardenafil (LEVITRA) 5 MG tablet Take 1 tablet (5 mg total) by mouth as needed  for erectile dysfunction. (Patient not taking: Reported on 08/31/2021)  ? ?No current facility-administered medications on file prior to visit.  ?  ? ?Allergies:   Norvasc [amlodipine]  ? ?Social History  ? ?Tobacco Use  ? Smoking status: Former  ?  Types: Cigarettes  ? Smokeless tobacco: Never  ? Tobacco comments:  ?  Quit two years ago.  Light smoker prior.   ?Vaping Use  ? Vaping Use: Never used  ?Substance Use Topics  ? Alcohol use: Yes  ?  Alcohol/week: 3.0 standard drinks  ?   Types: 3 Standard drinks or equivalent per week  ? Drug use: No  ? ? ?Family History: ?family history includes Diabetes in an other family member; Hypertension in his mother; Sickle cell anemia in his son and son. ? ?ROS:   ?Please see the history of present illness.  Additional pertinent ROS: ?Constitutional: Negative for chills, fever, night sweats, unintentional weight loss. Positive for fatigue.  ?HENT: Negative for ear pain and hearing loss.   ?Eyes: Negative for loss of vision and eye pain.  ?Respiratory: Negative for cough, sputum, wheezing.   ?Cardiovascular: See HPI. ?Gastrointestinal: Negative for abdominal pain, melena, and hematochezia.  ?Genitourinary: Negative for dysuria and hematuria.  ?Musculoskeletal: Negative for falls and myalgias.  ?Skin: Negative for itching and rash.  ?Neurological: Negative for focal weakness, focal sensory changes and loss of consciousness.  ?Endo/Heme/Allergies: Does not bruise/bleed easily.   ? ? ?EKGs/Labs/Other Studies Reviewed:   ? ?The following studies were reviewed today: ? ?CT Chest 03/03/2018: ?Cardiovascular: Coronary, aortic arch, and branch vessel ?atherosclerotic vascular disease. ?  ?Vascular/Lymphatic: Aortoiliac atherosclerotic vascular disease. ?Circumaortic left renal vein. ?  ?IMPRESSION: ?1. No appreciable adenopathy or bony lesions characteristic of ?myeloma. ?2. Other imaging findings of potential clinical significance: Aortic ?Atherosclerosis (ICD10-I70.0). Coronary atherosclerosis. Multilevel ?lumbar impingement. Nonobstructive 2 mm right renal calculus. ?Hypodense left renal lesions are probably cysts although enhancement ?characteristics are not assessed. Small umbilical hernia is lax and ?contains a loop of small bowel. ? ?Echo 09/22/2016: ?Study Conclusions  ? ?- Left ventricle: The cavity size was normal. There was moderate  ?  concentric hypertrophy. Systolic function was normal. The  ?  estimated ejection fraction was in the range of 60% to  65%. Wall  ?  motion was normal; there were no regional wall motion  ?  abnormalities. The study is not technically sufficient to allow  ?  evaluation of LV diastolic function.  ?- Aortic valve: Trileaflet; mildly thickened, mildly calcified  ?  leaflets.  ?- Right ventricle: The cavity size was normal. Wall thickness was  ?  normal. Systolic function was normal.  ? ?Monitor 09/2016: ?NSR, sinus brady, premature ventricular and atrial contractions not frequent.  Runs of brief atrial tachy.  No sustained arrhythmias.   ? ?ETT 07/18/2015: ?Blood pressure demonstrated a hypertensive response to exercise. ?There was no ST segment deviation noted during stress. ?No T wave inversion was noted during stress. ?  ?Poor exercise tolerance, limited by dyspnea and knee pain.. No ECG evidence of exercise induced ischemia. Hypertensive response to exercise. ? ?EKG:  EKG is personally reviewed.   ?08/31/2021: NSR at 92 bpm, borderline LVH ? ?Recent Labs: ?08/21/2021: ALT 16; BUN 18; Creat 1.47; Hemoglobin 13.9; Platelets 203; Potassium 3.7; Sodium 140; TSH 1.94  ? ?Recent Lipid Panel ?   ?Component Value Date/Time  ? CHOL 147 08/21/2021 1520  ? CHOL 115 08/27/2020 1047  ? TRIG 110 08/21/2021 1520  ? HDL 40 08/21/2021  1520  ? HDL 34 (L) 08/27/2020 1047  ? CHOLHDL 3.7 08/21/2021 1520  ? VLDL 17 05/30/2015 1422  ? [place] 86 08/21/2021 1520  ? ? ?Physical Exam:   ? ?VS:  BP (!) 166/100 (BP Location: Right Arm, Patient Position: Sitting, Cuff Size: Large)   Pulse 92   Ht '5\' 7"'$  (1.702 m)   Wt 275 lb 1.6 oz (124.8 kg)   BMI 43.09 kg/m?    ? ?Wt Readings from Last 3 Encounters:  ?08/31/21 275 lb 1.6 oz (124.8 kg)  ?08/21/21 267 lb 9.6 oz (121.4 kg)  ?07/03/21 271 lb 9.6 oz (123.2 kg)  ?  ?GEN: Well nourished, well developed in no acute distress ?HEENT: Normal, moist mucous membranes ?NECK: No JVD ?CARDIAC: regular rhythm, normal S1 and S2, no rubs or gallops. No murmur. ?VASCULAR: Radial and DP pulses 2+ bilaterally. No carotid  bruits ?RESPIRATORY:  Clear to auscultation without rales, wheezing or rhonchi  ?ABDOMEN: Soft, non-tender, non-distended ?MUSCULOSKELETAL:  Ambulates independently ?SKIN: Warm and dry, 2+ bilateral LE edema (pittin

## 2021-08-31 ENCOUNTER — Ambulatory Visit (HOSPITAL_BASED_OUTPATIENT_CLINIC_OR_DEPARTMENT_OTHER): Payer: BC Managed Care – PPO | Admitting: Cardiology

## 2021-08-31 ENCOUNTER — Other Ambulatory Visit: Payer: Self-pay

## 2021-08-31 ENCOUNTER — Encounter (HOSPITAL_BASED_OUTPATIENT_CLINIC_OR_DEPARTMENT_OTHER): Payer: Self-pay | Admitting: Cardiology

## 2021-08-31 VITALS — BP 166/100 | HR 92 | Ht 67.0 in | Wt 275.1 lb

## 2021-08-31 DIAGNOSIS — R600 Localized edema: Secondary | ICD-10-CM

## 2021-08-31 DIAGNOSIS — E349 Endocrine disorder, unspecified: Secondary | ICD-10-CM | POA: Diagnosis not present

## 2021-08-31 DIAGNOSIS — I10 Essential (primary) hypertension: Secondary | ICD-10-CM | POA: Diagnosis not present

## 2021-08-31 DIAGNOSIS — I700 Atherosclerosis of aorta: Secondary | ICD-10-CM

## 2021-08-31 DIAGNOSIS — Z7189 Other specified counseling: Secondary | ICD-10-CM

## 2021-08-31 DIAGNOSIS — I2510 Atherosclerotic heart disease of native coronary artery without angina pectoris: Secondary | ICD-10-CM

## 2021-08-31 NOTE — Patient Instructions (Signed)
Medication Instructions:  Your physician recommends that you continue on your current medications as directed. Please refer to the Current Medication list given to you today.  Labwork: NONE  Testing/Procedures: Your physician has requested that you have an echocardiogram. Echocardiography is a painless test that uses sound waves to create images of your heart. It provides your doctor with information about the size and shape of your heart and how well your heart's chambers and valves are working. This procedure takes approximately one hour. There are no restrictions for this procedure.  Follow-Up: AS NEEDED    

## 2021-09-01 ENCOUNTER — Telehealth: Payer: Self-pay

## 2021-09-01 DIAGNOSIS — E1122 Type 2 diabetes mellitus with diabetic chronic kidney disease: Secondary | ICD-10-CM

## 2021-09-01 NOTE — Telephone Encounter (Signed)
Please advise forms up front. ?

## 2021-09-01 NOTE — Telephone Encounter (Signed)
[facility] Form Special report placed in bin to be signed  ?

## 2021-09-02 ENCOUNTER — Other Ambulatory Visit: Payer: Self-pay

## 2021-09-02 ENCOUNTER — Ambulatory Visit (INDEPENDENT_AMBULATORY_CARE_PROVIDER_SITE_OTHER): Payer: BC Managed Care – PPO

## 2021-09-02 DIAGNOSIS — R600 Localized edema: Secondary | ICD-10-CM

## 2021-09-02 LAB — ECHOCARDIOGRAM COMPLETE
AR max vel: 3.43 cm2
AV Area VTI: 3.96 cm2
AV Area mean vel: 3.64 cm2
AV Mean grad: 5 mmHg
AV Peak grad: 10.9 mmHg
Ao pk vel: 1.65 m/s
Area-P 1/2: 3.12 cm2
Calc EF: 57.4 %
S' Lateral: 3.32 cm
Single Plane A2C EF: 52.2 %
Single Plane A4C EF: 63.7 %

## 2021-09-03 NOTE — Telephone Encounter (Signed)
Placed in your sign folder, on last page they are requesting additional dx codes  ?

## 2021-09-04 NOTE — Telephone Encounter (Signed)
Dr Carlota Raspberry has placed this in your sign folder  ?

## 2021-09-04 NOTE — Telephone Encounter (Signed)
Seen by Kathrin Ruddy on this visit - will forward paperwork to him.  ?

## 2021-09-07 ENCOUNTER — Other Ambulatory Visit: Payer: Self-pay | Admitting: Family Medicine

## 2021-09-07 DIAGNOSIS — I10 Essential (primary) hypertension: Secondary | ICD-10-CM

## 2021-09-07 DIAGNOSIS — M792 Neuralgia and neuritis, unspecified: Secondary | ICD-10-CM

## 2021-09-07 DIAGNOSIS — E1122 Type 2 diabetes mellitus with diabetic chronic kidney disease: Secondary | ICD-10-CM

## 2021-09-08 ENCOUNTER — Other Ambulatory Visit: Payer: Self-pay | Admitting: Family Medicine

## 2021-09-08 ENCOUNTER — Other Ambulatory Visit: Payer: Self-pay

## 2021-09-08 DIAGNOSIS — I10 Essential (primary) hypertension: Secondary | ICD-10-CM

## 2021-09-08 NOTE — Telephone Encounter (Signed)
Returned to brooke ? ?Thanks, ? ?Rich

## 2021-09-15 ENCOUNTER — Other Ambulatory Visit: Payer: Self-pay

## 2021-09-15 DIAGNOSIS — M792 Neuralgia and neuritis, unspecified: Secondary | ICD-10-CM

## 2021-09-15 NOTE — Telephone Encounter (Signed)
Patient is calling in stating that he received a letter from the pharmacy the medication pregabalin (LYRICA) 150 MG capsule needs a PA.  ?

## 2021-09-17 NOTE — Telephone Encounter (Signed)
Spoke with pharmacist, she has stated they did not receive Rx when we had sent on 09/08/21 needs to be sent in in order for them to determine need for PA.  ? ?

## 2021-09-17 NOTE — Telephone Encounter (Signed)
Pt called in checking on this, pt can be reached at the home # and he states that he is almost out  ?

## 2021-09-17 NOTE — Addendum Note (Signed)
Addended by: Patrcia Dolly on: 09/17/2021 04:21 PM ? ? Modules accepted: Orders ? ?

## 2021-09-18 MED ORDER — PREGABALIN 150 MG PO CAPS: ORAL_CAPSULE | ORAL | 0 refills | Status: DC

## 2021-09-18 NOTE — Addendum Note (Signed)
Addended by: Merri Ray R on: 09/18/2021 09:37 AM ? ? Modules accepted: Orders ? ?

## 2021-09-18 NOTE — Telephone Encounter (Signed)
Controlled substance database reviewed, last filled February 16.  Refill ordered. ?

## 2021-10-12 ENCOUNTER — Telehealth: Payer: Self-pay | Admitting: Cardiology

## 2021-10-12 NOTE — Telephone Encounter (Signed)
Patient returned call for his echo results 

## 2021-10-12 NOTE — Telephone Encounter (Signed)
Results called to patient who verbalizes understanding!  ? ? ? ? ? ?"Normal squeeze of the heart. Muscle is slightly thick, which is common with a history of high blood pressure. That thickening of the muscle can cause the heart to be slightly stiff, which can sometime lead to fluid in the legs. This couldn't be completely assessed on current study, but the management is to control the blood pressure, watch salt intake, elevate feet, and wear compression stockings.- Dr. Harrell Gave"  ?

## 2021-10-25 ENCOUNTER — Other Ambulatory Visit: Payer: Self-pay | Admitting: Registered Nurse

## 2021-10-25 DIAGNOSIS — R351 Nocturia: Secondary | ICD-10-CM

## 2021-10-25 NOTE — Telephone Encounter (Signed)
Deferring back to you ? ?Thanks, ? ?Rich

## 2021-10-26 NOTE — Telephone Encounter (Signed)
Flomax refilled temporarily, has appointment with me in July. ?

## 2021-10-27 ENCOUNTER — Telehealth: Payer: Self-pay | Admitting: Family Medicine

## 2021-10-27 DIAGNOSIS — M792 Neuralgia and neuritis, unspecified: Secondary | ICD-10-CM

## 2021-10-28 NOTE — Telephone Encounter (Signed)
He was last seen in January for a physical, again in March by my colleague.  We did not discuss his Lyrica recently.  Please schedule appointment with me to review his medication.  Temporary refill granted.  Controlled substance database reviewed without concerns.  Last filled 09/18/2021. ? ? ?

## 2021-10-28 NOTE — Telephone Encounter (Signed)
Patient is requesting a refill of the following medications: ?Requested Prescriptions  ? ?Pending Prescriptions Disp Refills  ? pregabalin (LYRICA) 150 MG capsule [Pharmacy Med Name: PREGABALIN '150MG'$  CAPSULES] 90 capsule   ?  Sig: TAKE 1 CAPSULE BY MOUTH THREE TIMES DAILY FOR NERVE PAIN  ? ? ?Date of patient request: 10/28/21 ?Last office visit: 08/21/21 ?Date of last refill: 09/18/21 ?Last refill amount: 90 ? ?

## 2021-10-29 NOTE — Telephone Encounter (Signed)
Patient is scheduled for a follow up in July.

## 2021-11-13 ENCOUNTER — Telehealth: Payer: Self-pay

## 2021-11-13 NOTE — Telephone Encounter (Signed)
Given to Dr. Carlota Raspberry to review. Once reviewed the progress note will be scanned in chart.

## 2021-12-05 ENCOUNTER — Other Ambulatory Visit: Payer: Self-pay | Admitting: Family Medicine

## 2021-12-05 DIAGNOSIS — E1122 Type 2 diabetes mellitus with diabetic chronic kidney disease: Secondary | ICD-10-CM

## 2021-12-17 ENCOUNTER — Other Ambulatory Visit: Payer: Self-pay | Admitting: Family Medicine

## 2021-12-17 DIAGNOSIS — M792 Neuralgia and neuritis, unspecified: Secondary | ICD-10-CM

## 2021-12-18 NOTE — Telephone Encounter (Signed)
See note from May.  Lyrica has not been discussed recently although he has seen my colleague for other issues.  Appointment requested.  It appears he is scheduled July 19.  Will refill Lyrica for now.  Needs to keep that appointment. Controlled substance database reviewed, last filled #[id number] on 10/28/2021.  No concerns.

## 2021-12-30 ENCOUNTER — Ambulatory Visit: Payer: BC Managed Care – PPO | Admitting: Family Medicine

## 2021-12-30 ENCOUNTER — Encounter: Payer: Self-pay | Admitting: Family Medicine

## 2021-12-30 VITALS — BP 130/72 | HR 80 | Temp 98.1°F | Resp 20 | Ht 67.0 in | Wt 268.0 lb

## 2021-12-30 DIAGNOSIS — E1122 Type 2 diabetes mellitus with diabetic chronic kidney disease: Secondary | ICD-10-CM

## 2021-12-30 DIAGNOSIS — M792 Neuralgia and neuritis, unspecified: Secondary | ICD-10-CM

## 2021-12-30 DIAGNOSIS — R7989 Other specified abnormal findings of blood chemistry: Secondary | ICD-10-CM

## 2021-12-30 DIAGNOSIS — E785 Hyperlipidemia, unspecified: Secondary | ICD-10-CM

## 2021-12-30 DIAGNOSIS — I10 Essential (primary) hypertension: Secondary | ICD-10-CM | POA: Diagnosis not present

## 2021-12-30 DIAGNOSIS — R531 Weakness: Secondary | ICD-10-CM

## 2021-12-30 LAB — POCT GLYCOSYLATED HEMOGLOBIN (HGB A1C): Hemoglobin A1C: 6.8 % — AB (ref 4.0–5.6)

## 2021-12-30 MED ORDER — PREGABALIN 150 MG PO CAPS: 150.0000 mg | ORAL_CAPSULE | Freq: Two times a day (BID) | ORAL | 1 refills | Status: DC

## 2021-12-30 MED ORDER — LOSARTAN POTASSIUM-HCTZ 100-25 MG PO TABS: 1.0000 | ORAL_TABLET | Freq: Every day | ORAL | 1 refills | Status: DC

## 2021-12-30 MED ORDER — ATORVASTATIN CALCIUM 10 MG PO TABS: ORAL_TABLET | ORAL | 1 refills | Status: DC

## 2021-12-30 MED ORDER — METFORMIN HCL 500 MG PO TABS: ORAL_TABLET | ORAL | 1 refills | Status: DC

## 2021-12-30 NOTE — Patient Instructions (Addendum)
I would recommend discussing the testosterone patches with urology to see if they would write those temporarily while you are waiting for insurance to get other medication covered.  I would recommend follow up to discuss the fatigue and weakness further, especially if not improving with testosterone.  Return to the clinic or go to the nearest emergency room if any of your symptoms worsen or new symptoms occur.  No change in lyrica for now.  We will work on clarifying your medications.   Use compression stockings for leg swelling.   Thank you for coming in today.

## 2021-12-30 NOTE — Progress Notes (Signed)
Subjective:  Patient ID: Jeffrey Parks, male    DOB: [birth year]  Age: 71 y.o. MRN: [id number]  CC:  Chief Complaint  Patient presents with   Diabetes   Hyperlipidemia   Hypertension    HPI Jeffrey Parks presents for follow up. Last visit with me in 2022, has seen my colleague since.    Diabetes: With chronic kidney disease, followed by nephrology.  Few days ago had stable kidney test? Recent nephrology visit - told stable. New diuretic torsemide.  metformin - '500mg'$  BID. No new side effects.  Home readings:109.no 200's.  High 130.  No sx lows.  On statin, ARB.  Lab Results  Component Value Date   HGBA1C 6.4 (H) 07/03/2021   HGBA1C 7.3 (H) 08/27/2020   HGBA1C 6.5 (H) 07/18/2019   Lab Results  Component Value Date   LDLCALC 86 08/21/2021   CREATININE 1.47 (H) 08/21/2021   Followed by urology for fatigue and low testosterone. Not on testosterone supplementation yet. They plan on prescribing testosterone, but working on getting covered with insurance and with delay he would like to purchase patches out of pocket.  Reports feeling weak all over. Attributes this to his low testosterone level.  CBC normal, TSH, CMP normal except creat 1.47 in March.   Chronic low back pain, neck pain Followed by [facility].  Last discussed with me in March 2022.  Has been treated by pain management and has received spinal injections in the past.  Treated with Lyrica 150 mg  - taking 2 times per day. Working well on current dose, no specialist visit needed recently. Restless leg symptoms also controlled with this medicine.  Controlled substance database reviewed, last filled pregabalin No. 90 on 12/18/2021.  Peripheral edema, hypertension, with chronic kidney disease Compression stockings discussed in the past, and treated with Lasix 80 mg daily in the past.  He was referred to cardiology and was seen on March 20.  Components of both pitting and nonpitting edema.  Likely component of  venous insufficiency, updated echo was ordered - March 22nd. EF 60-65%. No regional wall motion abnormalities.   Leg elevation, compression stockings, salt avoidance discussed.  Episodic use compression stockings.   Reports recent nephrology visit in June - records unavailable. started on potassium supplement and farxiga.    Note reviewed from Nov 05, 2021.  Creatinine 1.44 at that time.  Potassium 3.4.  33-monthfollow-up recommended.  For hypertension with renal disease started on torsemide 20 mg tablet 2 every morning, continued on losartan HCTZ 100/25 mg, continued on hydralazine 50 mg 3 times daily and carvedilol 25 mg twice daily.  Torsemide replace the Lasix. 2-week blood pressure/lab check planned.  I am unable to see a more recent visit.   Hyperlipidemia: With coronary and aortic calcium CT consistent with atherosclerosis, plan to continue atorvastatin and option to add aspirin 81 mg daily per cardiology in March. Taking lipitor '10mg'$   - no new myalgias.  Lab Results  Component Value Date   CHOL 147 08/21/2021   HDL 40 08/21/2021   LDLCALC 86 08/21/2021   TRIG 110 08/21/2021   CHOLHDL 3.7 08/21/2021   Lab Results  Component Value Date   ALT 16 08/21/2021   AST 16 08/21/2021   ALKPHOS 78 08/27/2020   BILITOT 0.5 08/21/2021       History Patient Active Problem List   Diagnosis Date Noted   Chronic kidney disease, stage III (moderate) ([place] 02/08/2018   Stenosis of cervical spine with  myelopathy ([place]) 06/22/2017   Oropharyngeal dysphagia 06/22/2017   Type 2 diabetes mellitus with complication ([place]) 62/83/1517   Gait instability 01/14/2017   Hyperreflexia 01/14/2017   Cervicalgia 01/14/2017   Leg swelling 09/08/2016   Sleep apnea 09/08/2016   Dizziness 09/08/2016   Fatigue 05/30/2015   Hypogonadism male 05/22/2015   Morbid obesity due to excess calories ([place]) 05/22/2015   Testosterone insufficiency 05/22/2015   Essential hypertension 05/07/2015   Erectile  dysfunction 05/07/2015   Past Medical History:  Diagnosis Date   Hypertension    Low testosterone    Sickle cell trait [facility])    Past Surgical History:  Procedure Laterality Date   neck surgery, Dec. 14,2018     None     Allergies  Allergen Reactions   Norvasc [Amlodipine]    Prior to Admission medications   Medication Sig Start Date End Date Taking? Authorizing Provider  atorvastatin (LIPITOR) 10 MG tablet TAKE 1 TABLET(10 MG) BY MOUTH DAILY 12/07/21  Yes Wendie Agreste, MD  carvedilol (COREG) 3.125 MG tablet TAKE 1 TABLET(3.125 MG) BY MOUTH TWICE DAILY WITH A MEAL 09/08/21  Yes Wendie Agreste, MD  furosemide (LASIX) 40 MG tablet Take 2 tablets (80 mg total) by mouth daily. 08/27/20  Yes Wendie Agreste, MD  latanoprost (XALATAN) 0.005 % ophthalmic solution SMARTSIG:1 Drop(s) In Eye(s) Every Evening 06/08/21  Yes [provider]  losartan-hydrochlorothiazide (HYZAAR) 100-25 MG tablet TAKE 1 TABLET BY MOUTH DAILY 08/10/21  Yes Wendie Agreste, MD  metFORMIN (GLUCOPHAGE) 500 MG tablet TAKE 1 TABLET(500 MG) BY MOUTH TWICE DAILY WITH A MEAL 12/07/21  Yes Wendie Agreste, MD  pregabalin (LYRICA) 150 MG capsule TAKE 1 CAPSULE BY MOUTH THREE TIMES DAILY FOR NERVE PAIN 12/18/21  Yes Wendie Agreste, MD  Sennosides-Docusate Sodium (SENNA-DOCUSATE SODIUM PO) Take by mouth.   Yes [provider]  tamsulosin (FLOMAX) 0.4 MG CAPS capsule TAKE 1 CAPSULE(0.4 MG) BY MOUTH DAILY 10/26/21  Yes Wendie Agreste, MD  vardenafil (LEVITRA) 5 MG tablet Take 1 tablet (5 mg total) by mouth as needed for erectile dysfunction. 07/04/21  Yes Maximiano Coss, NP   Social History   Socioeconomic History   Marital status: Single    Spouse name: Not on file   Number of children: Not on file   Years of education: Not on file   Highest education level: Not on file  Occupational History   Not on file  Tobacco Use   Smoking status: Former    Types: Cigarettes   Smokeless tobacco: Never    Tobacco comments:    Quit two years ago.  Light smoker prior.   Vaping Use   Vaping Use: Never used  Substance and Sexual Activity   Alcohol use: Yes    Alcohol/week: 3.0 standard drinks of alcohol    Types: 3 Standard drinks or equivalent per week   Drug use: No   Sexual activity: Not on file  Other Topics Concern   Not on file  Social History Narrative   Lives with wife, 2 adult children and 3 grandchildren   Has 2 deceased children - 4 children total   Has PhD in literature   Professor at [employer]   Social Determinants of Radio broadcast assistant Strain: Not on file  Food Insecurity: Not on file  Transportation Needs: Not on file  Physical Activity: Not on file  Stress: Not on file  Social Connections: Not on file  Intimate Partner Violence: Not  on file    Review of Systems  Constitutional:  Negative for fatigue and unexpected weight change.  Eyes:  Negative for visual disturbance.  Respiratory:  Negative for cough, chest tightness and shortness of breath.   Cardiovascular:  Positive for leg swelling. Negative for chest pain and palpitations.  Gastrointestinal:  Negative for abdominal pain and blood in stool.  Neurological:  Negative for dizziness, light-headedness and headaches.     Objective:   Vitals:   12/30/21 1510  BP: 130/72  Pulse: 80  Resp: 20  Temp: 98.1 F (36.7 C)  TempSrc: Oral  SpO2: 96%  Weight: 268 lb (121.6 kg)  Height: '5\' 7"'$  (1.702 m)    Physical Exam Vitals reviewed.  Constitutional:      Appearance: He is well-developed.  HENT:     Head: Normocephalic and atraumatic.  Neck:     Vascular: No carotid bruit or JVD.  Cardiovascular:     Rate and Rhythm: Normal rate and regular rhythm.     Heart sounds: Normal heart sounds. No murmur heard. Pulmonary:     Effort: Pulmonary effort is normal.     Breath sounds: Normal breath sounds. No rales.  Musculoskeletal:     Right lower leg: Edema present.     Left lower leg:  Edema present.  Skin:    General: Skin is warm and dry.  Neurological:     Mental Status: He is alert and oriented to person, place, and time.  Psychiatric:        Mood and Affect: Mood normal.   Pitting edema lower legs to mid tibia bilat.     65 minutes spent during visit, including chart review, discussion of medications and clarification of medication regimen review of previous specialist notes and studies including echo, cardiology note, counseling and assimilation of information, exam, discussion of plan, and chart completion.    Lab Results  Component Value Date   HGBA1C 6.8 (A) 12/30/2021    Assessment & Plan:  Jeffrey Parks is a 71 y.o. male . Generalized weakness Low testosterone   - Patient attributes his generalized weakness, fatigue to low testosterone.  Under the care of urology with planned start of testosterone.  He did ask about short-term supply out-of-pocket and recommended he discuss that option with treating specialist.  Echo, blood work as above without apparent cause of generalized weakness.  Recommended further evaluation including possible neuro eval if symptoms persist especially once he starts testosterone.  RTC/ER precautions if worsening symptoms.  Neuropathic pain - Plan: pregabalin (LYRICA) 150 MG capsule  -Stable pain control with current dose Lyrica.  We did discuss the side effect of peripheral edema with use of Lyrica, would consider lower doses or alternate medication/treatment plan with specialist if persistent edema, especially if any worsening.  Declined medication changes at this time.  Compression stockings recommended.  Type 2 diabetes mellitus with chronic kidney disease, without long-term current use of insulin, unspecified CKD stage (HCC) - Plan: atorvastatin (LIPITOR) 10 MG tablet, metFORMIN (GLUCOPHAGE) 500 MG tablet, POCT glycosylated hemoglobin (Hb A1C)  -Tolerating current regimen, A1c stable, continue same.  Continue  statin.  Essential hypertension - Plan: losartan-hydrochlorothiazide (HYZAAR) 100-25 MG tablet  -Stable with current regimen, however there is some question about medications used, will try to clarify with a nurse visit and review of most recent nephrology notes.  No changes for now.   75-monthfollow-up  Hyperlipidemia, unspecified hyperlipidemia type Continue statin as currently tolerated, plan for fasting labs next visit.  Meds ordered this encounter  Medications   pregabalin (LYRICA) 150 MG capsule    Sig: Take 1 capsule (150 mg total) by mouth 2 (two) times daily.    Dispense:  180 capsule    Refill:  1    Ok to fill 01/31/22.   atorvastatin (LIPITOR) 10 MG tablet    Sig: TAKE 1 TABLET(10 MG) BY MOUTH DAILY    Dispense:  90 tablet    Refill:  1   metFORMIN (GLUCOPHAGE) 500 MG tablet    Sig: TAKE 1 TABLET(500 MG) BY MOUTH TWICE DAILY WITH A MEAL    Dispense:  180 tablet    Refill:  1   losartan-hydrochlorothiazide (HYZAAR) 100-25 MG tablet    Sig: Take 1 tablet by mouth daily.    Dispense:  90 tablet    Refill:  1   Patient Instructions  I would recommend discussing the testosterone patches with urology to see if they would write those temporarily while you are waiting for insurance to get other medication covered.  I would recommend follow up to discuss the fatigue and weakness further, especially if not improving with testosterone.  Return to the clinic or go to the nearest emergency room if any of your symptoms worsen or new symptoms occur.  No change in lyrica for now.  We will work on clarifying your medications.   Use compression stockings for leg swelling.   Thank you for coming in today.       Signed,   Merri Ray, MD [place] 12/30/21 3:40 PM

## 2022-02-18 ENCOUNTER — Other Ambulatory Visit: Payer: Self-pay | Admitting: Family Medicine

## 2022-02-18 DIAGNOSIS — R351 Nocturia: Secondary | ICD-10-CM

## 2022-04-05 ENCOUNTER — Encounter: Payer: Self-pay | Admitting: Family Medicine

## 2022-04-05 ENCOUNTER — Ambulatory Visit (INDEPENDENT_AMBULATORY_CARE_PROVIDER_SITE_OTHER): Payer: BC Managed Care – PPO | Admitting: Family Medicine

## 2022-04-05 VITALS — BP 130/90 | HR 76 | Temp 98.6°F | Ht 67.0 in | Wt 272.6 lb

## 2022-04-05 DIAGNOSIS — Z23 Encounter for immunization: Secondary | ICD-10-CM

## 2022-04-05 DIAGNOSIS — G8929 Other chronic pain: Secondary | ICD-10-CM

## 2022-04-05 DIAGNOSIS — R351 Nocturia: Secondary | ICD-10-CM

## 2022-04-05 DIAGNOSIS — R609 Edema, unspecified: Secondary | ICD-10-CM | POA: Diagnosis not present

## 2022-04-05 DIAGNOSIS — E785 Hyperlipidemia, unspecified: Secondary | ICD-10-CM

## 2022-04-05 DIAGNOSIS — M792 Neuralgia and neuritis, unspecified: Secondary | ICD-10-CM

## 2022-04-05 DIAGNOSIS — I10 Essential (primary) hypertension: Secondary | ICD-10-CM | POA: Diagnosis not present

## 2022-04-05 DIAGNOSIS — D472 Monoclonal gammopathy: Secondary | ICD-10-CM | POA: Diagnosis not present

## 2022-04-05 DIAGNOSIS — R29898 Other symptoms and signs involving the musculoskeletal system: Secondary | ICD-10-CM

## 2022-04-05 DIAGNOSIS — M545 Low back pain, unspecified: Secondary | ICD-10-CM

## 2022-04-05 DIAGNOSIS — N529 Male erectile dysfunction, unspecified: Secondary | ICD-10-CM

## 2022-04-05 DIAGNOSIS — E1122 Type 2 diabetes mellitus with diabetic chronic kidney disease: Secondary | ICD-10-CM | POA: Diagnosis not present

## 2022-04-05 MED ORDER — ATORVASTATIN CALCIUM 10 MG PO TABS: ORAL_TABLET | ORAL | 1 refills | Status: DC

## 2022-04-05 MED ORDER — LOSARTAN POTASSIUM-HCTZ 100-25 MG PO TABS: 1.0000 | ORAL_TABLET | Freq: Every day | ORAL | 1 refills | Status: DC

## 2022-04-05 NOTE — Progress Notes (Signed)
Subjective:  Patient ID: Jeffrey Parks, male    DOB: 26-Jul-1950  Age: 71 y.o. MRN: [id number]  CC:  Chief Complaint  Patient presents with   Hyperlipidemia    Pt states all is well   Hypertension   Back Pain    Pt states he has tighness in his back, pt states it has been going on for 1 week, pt has taken tylenol and states it helped a little    Diabetes    HPI Jeffrey Parks presents for   Diabetes: With chronic kidney disease followed by nephrology.  Treated with farxiga 10 mg daily.  Off metformin since change to farxiga. Stable A1c in July. Home readings - none.  No symptomatic low symptoms.  He is on statin, ARB  Microalbumin: ratio 184 in 08/2020  Optho, foot exam, pneumovax: utd.   Lab Results  Component Value Date   HGBA1C 6.8 (A) 12/30/2021   HGBA1C 6.4 (H) 07/03/2021   HGBA1C 7.3 (H) 08/27/2020   Lab Results  Component Value Date   LDLCALC 86 08/21/2021   CREATININE 1.47 (H) 08/21/2021   Hyperlipidemia: On Lipitor, last levels in March.  No new myalgias/side effects. Lab Results  Component Value Date   CHOL 147 08/21/2021   HDL 40 08/21/2021   LDLCALC 86 08/21/2021   TRIG 110 08/21/2021   CHOLHDL 3.7 08/21/2021   Lab Results  Component Value Date   ALT 16 08/21/2021   AST 16 08/21/2021   ALKPHOS 78 08/27/2020   BILITOT 0.5 08/21/2021    Hypertension: Losartan hydrochlorothiazide 100/25 mg daily.  No longer on coreg? Appears he was still on carvedilol.  Furosemide changed to torsemide by nephrology.   Takes hydralazine '50mg'$  tid. CKD - appt with nephrology next month.  Home BP 117/68, 110/66, 134/79.  BP Readings from Last 3 Encounters:  04/05/22 (!) 130/90  12/30/21 130/72  08/31/21 (!) 166/100   Lab Results  Component Value Date   CREATININE 1.47 (H) 08/21/2021    Low back pain:  Feels like legs are weak for past few months. No acute changes. No bowel or bladder incontinence, no saddle anesthesia.   He has been seen by [facility] for chronic back and neck pain and treated by pain management with spinal injections, Lyrica '150mg'$  BID for neuropathic pain.Saw Dr. Annette Stable at [facility] in 2021. Has received back injections - planning for additional injection. Did not have. Walking with cane. Some increased soreness past week.  Treated with voltaren gel, tylenol. No injury. Reaction to mm relaxant in past.   Of note generalized fatigue discussed last visit, which he attributed to his low testosterone, was under the care of urology.September 7th visit reviewed.  Treated with 200 mg testosterone every other week with testosterone level 368 at the end of his 2-week cycle on 02/08/2022.  Per urology he was still having lower extremity weakness on testosterone, not thought to be result of low testosterone and recommended discussion with me.  Thoracic and lumbar MRI noted from [place] on 04/25/2020, multilevel thoracic degenerative disc disease without significant central or significant foraminal stenosis at any level.  On lumbar imaging multilevel DDD and spondylosis along with multilevel facet degenerative changes.  Mild central canal stenosis L2-3, moderate narrowing of the thecal sac at L3-4 as well as L4-5.  L5-6 moderate central canal stenosis with anterior listhesis, diffuse disc bulging and spondylosis.  Bilateral foraminal stenosis and possible nerve root compromise in either neuroforamina or either lateral recess.  Multiple level foraminal stenosis with possible nerve root compromise at several levels.  Left renal masses possible cysts also noted. Prior CT abdomen pelvis 03/03/2018 with probable cysts as hypodense left renal lesions.    History of IgM monoclonal gammopathy of undetermined significance.last visit with Dr. Irene Limbo in 2019. 4 month follow up planned.   History Patient Active Problem List   Diagnosis Date Noted   Chronic kidney disease, stage III (moderate) ([place]) 02/08/2018   Stenosis of cervical spine  with myelopathy ([place]) 06/22/2017   Oropharyngeal dysphagia 06/22/2017   Type 2 diabetes mellitus with complication ([place]) 16/03/9603   Gait instability 01/14/2017   Hyperreflexia 01/14/2017   Cervicalgia 01/14/2017   Leg swelling 09/08/2016   Sleep apnea 09/08/2016   Dizziness 09/08/2016   Fatigue 05/30/2015   Hypogonadism male 05/22/2015   Morbid obesity due to excess calories ([place]) 05/22/2015   Testosterone insufficiency 05/22/2015   Essential hypertension 05/07/2015   Erectile dysfunction 05/07/2015   Past Medical History:  Diagnosis Date   Hypertension    Low testosterone    Sickle cell trait [facility])    Past Surgical History:  Procedure Laterality Date   neck surgery, Dec. 14,2018     None     Allergies  Allergen Reactions   Norvasc [Amlodipine]    Prior to Admission medications   Medication Sig Start Date End Date Taking? Authorizing Provider  Alprostadil (PROSTAGLANDIN E1) POWD by Does not apply route.   Yes [provider]  atorvastatin (LIPITOR) 10 MG tablet TAKE 1 TABLET(10 MG) BY MOUTH DAILY 12/30/21  Yes Wendie Agreste, MD  dapagliflozin propanediol (FARXIGA) 10 MG TABS tablet Take 100 mg by mouth daily.   Yes [provider]  furosemide (LASIX) 40 MG tablet Take 2 tablets (80 mg total) by mouth daily. 08/27/20  Yes Wendie Agreste, MD  latanoprost (XALATAN) 0.005 % ophthalmic solution SMARTSIG:1 Drop(s) In Eye(s) Every Evening 06/08/21  Yes [provider]  losartan-hydrochlorothiazide (HYZAAR) 100-25 MG tablet Take 1 tablet by mouth daily. 12/30/21  Yes Wendie Agreste, MD  Multiple Vitamins-Minerals (MULTIVIT/MULTIMINERAL ADULT PO) Take by mouth.   Yes [provider]  Potassium (POTASSIMIN PO) Take by mouth.   Yes [provider]  pregabalin (LYRICA) 150 MG capsule Take 1 capsule (150 mg total) by mouth 2 (two) times daily. 12/30/21  Yes Wendie Agreste, MD  Sennosides-Docusate Sodium (SENNA-DOCUSATE SODIUM PO)  Take by mouth.   Yes [provider]  tamsulosin (FLOMAX) 0.4 MG CAPS capsule TAKE 1 CAPSULE(0.4 MG) BY MOUTH DAILY 02/18/22  Yes Wendie Agreste, MD  testosterone cypionate (DEPOTESTOSTERONE CYPIONATE) 200 MG/ML injection Inject 200 mg into the muscle every 14 (fourteen) days.   Yes [provider]  carvedilol (COREG) 3.125 MG tablet TAKE 1 TABLET(3.125 MG) BY MOUTH TWICE DAILY WITH A MEAL Patient not taking: Reported on 04/05/2022 09/08/21   Wendie Agreste, MD  metFORMIN (GLUCOPHAGE) 500 MG tablet TAKE 1 TABLET(500 MG) BY MOUTH TWICE DAILY WITH A MEAL Patient not taking: Reported on 04/05/2022 12/30/21   Wendie Agreste, MD  vardenafil (LEVITRA) 5 MG tablet Take 1 tablet (5 mg total) by mouth as needed for erectile dysfunction. Patient not taking: Reported on 04/05/2022 07/04/21   Maximiano Coss, NP   Social History   Socioeconomic History   Marital status: Single    Spouse name: Not on file   Number of children: Not on file   Years of education: Not on file   Highest education  level: Not on file  Occupational History   Not on file  Tobacco Use   Smoking status: Former    Types: Cigarettes   Smokeless tobacco: Never   Tobacco comments:    Quit two years ago.  Light smoker prior.   Vaping Use   Vaping Use: Never used  Substance and Sexual Activity   Alcohol use: Yes    Alcohol/week: 3.0 standard drinks of alcohol    Types: 3 Standard drinks or equivalent per week   Drug use: No   Sexual activity: Not on file  Other Topics Concern   Not on file  Social History Narrative   Lives with wife, 2 adult children and 3 grandchildren   Has 2 deceased children - 4 children total   Has PhD in literature   Professor at [employer]   Social Determinants of Radio broadcast assistant Strain: Not on file  Food Insecurity: Not on file  Transportation Needs: Not on file  Physical Activity: Not on file  Stress: Not on file  Social Connections: Not on file   Intimate Partner Violence: Not on file    Review of Systems Per HPI  Objective:   Vitals:   04/05/22 1525  BP: (!) 130/90  Pulse: 76  Temp: 98.6 F (37 C)  SpO2: 96%  Weight: 272 lb 9.6 oz (123.7 kg)  Height: '5\' 7"'$  (1.702 m)     Physical Exam Vitals reviewed.  Constitutional:      Appearance: He is well-developed.  HENT:     Head: Normocephalic and atraumatic.  Neck:     Vascular: No carotid bruit or JVD.  Cardiovascular:     Rate and Rhythm: Normal rate and regular rhythm.     Heart sounds: Normal heart sounds. No murmur heard. Pulmonary:     Effort: Pulmonary effort is normal.     Breath sounds: Normal breath sounds. No rales.  Musculoskeletal:     Right lower leg: Edema present.     Left lower leg: Edema present.     Comments: No midline bony tenderness of the lumbar spine, describes area of discomfort as diffuse, lower lumbar spine.  Negative seated straight leg raise.  Ambulating with cane.  Equal lower extremity strength with knee flexion, extension testing.  Skin:    General: Skin is warm and dry.  Neurological:     Mental Status: He is alert and oriented to person, place, and time.  Psychiatric:        Mood and Affect: Mood normal.     54 minutes spent during visit, including chart review, counseling and assimilation of information, exam, discussion of plan, and chart completion.     Assessment & Plan:  Jeffrey Parks is a 71 y.o. male . Type 2 diabetes mellitus with chronic kidney disease, without long-term current use of insulin, unspecified CKD stage ([place]) - Plan: atorvastatin (LIPITOR) 10 MG tablet, Microalbumin / creatinine urine ratio, Hemoglobin A1c  -Check A1c.  Continue same med regimen for now.  Check urine microalbumin.  Need for influenza vaccination - Plan: Flu Vaccine QUAD High Dose(Fluad)  Peripheral edema  -Chronic, continue follow-up with nephrology.  We will continue same dose Lyrica for now but discussed potential contribution  to edema.  Lungs clear.  Essential hypertension - Plan: losartan-hydrochlorothiazide (HYZAAR) 100-25 MG tablet  -Borderline in office, lower readings at home.  No med changes for now.  Continue nephrology follow-up.  Did asked that he clarify his medications with nephrology specifically  carvedilol.  Neuropathic pain  -Continue Lyrica for now, see information below regarding low back pain.  Nocturia  -Followed by urology, continue Flomax.  Erectile dysfunction, unspecified erectile dysfunction type  -Continue follow-up with urology  MGUS (monoclonal gammopathy of unknown significance) - Plan: Ambulatory referral to Hematology / Oncology, CBC  -Previously seen by hematology, I do not see recent visit.  Last visit noted 77-monthfollow-up, but I do not see other recommendations on as needed follow-up per patient's history.  Check CBC, referral back to hematology for ongoing monitoring or decision on further testing.   Hyperlipidemia, unspecified hyperlipidemia type - Plan: Comprehensive metabolic panel, Lipid panel  -Check labs, continue statin same dose as above  Leg weakness, bilateral - Plan: Ambulatory referral to Neurosurgery Chronic low back pain, unspecified back pain laterality, unspecified whether sciatica present - Plan: Ambulatory referral to Neurosurgery  -Chronic low back pain, past few months with subjective leg weakness.  Multiple level foraminal stenosis with possible nerve root compromise at several levels on prior imaging.  Referred to neurosurgery to decide on repeat testing, treatment, with RTC/ER precautions if acute changes.  -Prior imaging noted with likely renal cyst, can decide if repeat imaging needed depending on evaluation with neurosurgery.  Recheck 1 month.   Meds ordered this encounter  Medications   atorvastatin (LIPITOR) 10 MG tablet    Sig: TAKE 1 TABLET(10 MG) BY MOUTH DAILY    Dispense:  90 tablet    Refill:  1   losartan-hydrochlorothiazide (HYZAAR)  100-25 MG tablet    Sig: Take 1 tablet by mouth daily.    Dispense:  90 tablet    Refill:  1   Patient Instructions  No change in meds today but please look at you meds at home and let u[place]now if they are different. It appears on last note from Dr. SJoelyn Omsyou were taking carvedilol, but please clarify what meds you need to be on with their office.   I will refer you back to Dr. KIrene Limbofor the previous MGUS, as well as to the neurosurgeon with the back pain and leg weakness past few months.  If any acute changes or worsening, be seen right away.  Follow-up with me in 1 month to review chronic medications, medical problems, and these referrals.  Thank you for coming in today.   Return to the clinic or go to the nearest emergency room if any of your symptoms worsen or new symptoms occur.        Signed,   JMerri Ray MD [facility] 04/05/22 3:25 PM

## 2022-04-05 NOTE — Patient Instructions (Addendum)
No change in meds today but please look at you meds at home and let us know if they are different. It appears on last note from Dr. Joelyn Oms you were taking carvedilol, but please clarify what meds you need to be on with their office.   I will refer you back to Dr. Irene Limbo for the previous MGUS, as well as to the neurosurgeon with the back pain and leg weakness past few months.  If any acute changes or worsening, be seen right away.  Follow-up with me in 1 month to review chronic medications, medical problems, and these referrals.  Thank you for coming in today.   Return to the clinic or go to the nearest emergency room if any of your symptoms worsen or new symptoms occur.

## 2022-04-06 ENCOUNTER — Telehealth: Payer: Self-pay | Admitting: Hematology

## 2022-04-06 LAB — CBC
HCT: 47.8 % (ref 39.0–52.0)
Hemoglobin: 15.6 g/dL (ref 13.0–17.0)
MCHC: 32.7 g/dL (ref 30.0–36.0)
MCV: 88.8 fl (ref 78.0–100.0)
Platelets: 197 10*3/uL (ref 150.0–400.0)
RBC: 5.38 Mil/uL (ref 4.22–5.81)
RDW: 15.8 % — ABNORMAL HIGH (ref 11.5–15.5)
WBC: 6.8 10*3/uL (ref 4.0–10.5)

## 2022-04-06 LAB — LIPID PANEL
Cholesterol: 95 mg/dL (ref 0–200)
HDL: 28.3 mg/dL — ABNORMAL LOW (ref >39.00)
LDL Cholesterol: 43 mg/dL (ref 0–99)
NonHDL: 66.6
Total CHOL/HDL Ratio: 3
Triglycerides: 118 mg/dL (ref 0.0–149.0)
VLDL: 23.6 mg/dL (ref 0.0–40.0)

## 2022-04-06 LAB — COMPREHENSIVE METABOLIC PANEL
ALT: 13 U/L (ref 0–53)
AST: 20 U/L (ref 0–37)
Albumin: 4.1 g/dL (ref 3.5–5.2)
Alkaline Phosphatase: 71 U/L (ref 39–117)
BUN: 21 mg/dL (ref 6–23)
CO2: 27 mEq/L (ref 19–32)
Calcium: 9.5 mg/dL (ref 8.4–10.5)
Chloride: 104 mEq/L (ref 96–112)
Creatinine, Ser: 1.67 mg/dL — ABNORMAL HIGH (ref 0.40–1.50)
GFR: 41.41 mL/min — ABNORMAL LOW (ref >60.00)
Glucose, Bld: 88 mg/dL (ref 70–99)
Potassium: 3.7 mEq/L (ref 3.5–5.1)
Sodium: 140 mEq/L (ref 135–145)
Total Bilirubin: 0.5 mg/dL (ref 0.2–1.2)
Total Protein: 7.2 g/dL (ref 6.0–8.3)

## 2022-04-06 LAB — MICROALBUMIN / CREATININE URINE RATIO
Creatinine,U: 77.5 mg/dL
Microalb Creat Ratio: 49.7 mg/g — ABNORMAL HIGH (ref 0.0–30.0)
Microalb, Ur: 38.5 mg/dL — ABNORMAL HIGH (ref 0.0–1.9)

## 2022-04-06 LAB — HEMOGLOBIN A1C: Hgb A1c MFr Bld: 6.6 % — ABNORMAL HIGH (ref 4.6–6.5)

## 2022-04-06 NOTE — Telephone Encounter (Signed)
Scheduled appt per 10/23 referral. Pt is aware of appt date and time. Pt is aware to arrive 15 mins prior to appt time and to bring and updated insurance card. Pt is aware of appt location.   

## 2022-04-26 ENCOUNTER — Inpatient Hospital Stay: Payer: BC Managed Care – PPO

## 2022-04-26 ENCOUNTER — Inpatient Hospital Stay: Payer: BC Managed Care – PPO | Attending: Hematology | Admitting: Hematology

## 2022-04-26 ENCOUNTER — Other Ambulatory Visit: Payer: Self-pay

## 2022-04-26 VITALS — BP 165/91 | HR 73 | Temp 97.9°F | Resp 16 | Wt 279.3 lb

## 2022-04-26 DIAGNOSIS — I10 Essential (primary) hypertension: Secondary | ICD-10-CM | POA: Insufficient documentation

## 2022-04-26 DIAGNOSIS — Z87891 Personal history of nicotine dependence: Secondary | ICD-10-CM | POA: Diagnosis not present

## 2022-04-26 DIAGNOSIS — R5383 Other fatigue: Secondary | ICD-10-CM | POA: Diagnosis not present

## 2022-04-26 DIAGNOSIS — D573 Sickle-cell trait: Secondary | ICD-10-CM | POA: Insufficient documentation

## 2022-04-26 DIAGNOSIS — M7989 Other specified soft tissue disorders: Secondary | ICD-10-CM | POA: Diagnosis not present

## 2022-04-26 DIAGNOSIS — D472 Monoclonal gammopathy: Secondary | ICD-10-CM | POA: Insufficient documentation

## 2022-04-26 LAB — CBC WITH DIFFERENTIAL (CANCER CENTER ONLY)
Abs Immature Granulocytes: 0.01 10*3/uL (ref 0.00–0.07)
Basophils Absolute: 0 10*3/uL (ref 0.0–0.1)
Basophils Relative: 0 %
Eosinophils Absolute: 0.2 10*3/uL (ref 0.0–0.5)
Eosinophils Relative: 2 %
HCT: 46.6 % (ref 39.0–52.0)
Hemoglobin: 15.7 g/dL (ref 13.0–17.0)
Immature Granulocytes: 0 %
Lymphocytes Relative: 42 %
Lymphs Abs: 3.1 10*3/uL (ref 0.7–4.0)
MCH: 28.9 pg (ref 26.0–34.0)
MCHC: 33.7 g/dL (ref 30.0–36.0)
MCV: 85.7 fL (ref 80.0–100.0)
Monocytes Absolute: 0.6 10*3/uL (ref 0.1–1.0)
Monocytes Relative: 8 %
Neutro Abs: 3.6 10*3/uL (ref 1.7–7.7)
Neutrophils Relative %: 48 %
Platelet Count: 177 10*3/uL (ref 150–400)
RBC: 5.44 MIL/uL (ref 4.22–5.81)
RDW: 15.3 % (ref 11.5–15.5)
WBC Count: 7.4 10*3/uL (ref 4.0–10.5)
nRBC: 0 % (ref 0.0–0.2)

## 2022-04-26 LAB — CMP (CANCER CENTER ONLY)
ALT: 15 U/L (ref 0–44)
AST: 18 U/L (ref 15–41)
Albumin: 4.2 g/dL (ref 3.5–5.0)
Alkaline Phosphatase: 77 U/L (ref 38–126)
Anion gap: 6 (ref 5–15)
BUN: 18 mg/dL (ref 8–23)
CO2: 29 mmol/L (ref 22–32)
Calcium: 9 mg/dL (ref 8.9–10.3)
Chloride: 104 mmol/L (ref 98–111)
Creatinine: 1.83 mg/dL — ABNORMAL HIGH (ref 0.61–1.24)
GFR, Estimated: 39 mL/min — ABNORMAL LOW (ref >60)
Glucose, Bld: 90 mg/dL (ref 70–99)
Potassium: 3.8 mmol/L (ref 3.5–5.1)
Sodium: 139 mmol/L (ref 135–145)
Total Bilirubin: 0.4 mg/dL (ref 0.3–1.2)
Total Protein: 7.4 g/dL (ref 6.5–8.1)

## 2022-04-26 LAB — LACTATE DEHYDROGENASE: LDH: 156 U/L (ref 98–192)

## 2022-04-26 NOTE — Progress Notes (Signed)
HEMATOLOGY/ONCOLOGY CONSULTATION NOTE  Date of Service: 04/26/2022  Patient Care Team: Wendie Agreste, MD as PCP - General (Family Medicine) Buford Dresser, MD as PCP - Cardiology (Cardiology)  CHIEF COMPLAINTS/PURPOSE OF CONSULTATION:  IgM MGUS (monoclonal gammopathy of unknown significance   HISTORY OF PRESENTING ILLNESS:   Jeffrey Parks is a wonderful 71 y.o. male who has been referred to [place] by Dr. Merri Ray, MD, for evaluation and management of MGUS. PCP notes that the patient was previously seen by hematology.   He was previously referred to me by Dr. Pearson Grippe on 02/15/2018 for Monoclonal Paraproteinemia. Patient was scheduled for an CT scan and did not follow up with [place] after. CT scan showed no signs of Lymphoma.   Patient is here with his wife during today's visit. He complains of muscle pain, mainly his hip, after his back surgery. He has nerve problem, which he has an upcoming Neurologist appointment.   He complains of consistent fatigue since our last visit. His wife reports of his weight gain in the past year.  He had sleep study previously around 4 years ago, which he was prescribed the Cpap machine. However, he is not using the Cpap machine anymore since it did not help him.   He denies fever, chills, abdominal pain, loss of appetite, abnormal bowl moments, unexpected weight loss, and new lumps/bumps during today's visit. However, he complains of leg swelling. He notes that he has not had an ultrasound for his leg as of right now.    MEDICAL HISTORY:  Past Medical History:  Diagnosis Date   Hypertension    Low testosterone    Sickle cell trait ([place])     SURGICAL HISTORY: Past Surgical History:  Procedure Laterality Date   neck surgery, Dec. 14,2018     None      SOCIAL HISTORY: Social History   Socioeconomic History   Marital status: Single    Spouse name: Not on file   Number of children: Not on file   Years of education: Not  on file   Highest education level: Not on file  Occupational History   Not on file  Tobacco Use   Smoking status: Former    Types: Cigarettes   Smokeless tobacco: Never   Tobacco comments:    Quit two years ago.  Light smoker prior.   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  MEDICATIONS:  Current Outpatient Medications  Medication Sig Dispense Refill   Alprostadil (PROSTAGLANDIN E1) POWD by Does not apply route.     atorvastatin (LIPITOR) 10 MG tablet TAKE 1 TABLET(10 MG) BY MOUTH DAILY 90 tablet 1   dapagliflozin propanediol (FARXIGA) 10 MG TABS tablet Take 100 mg by mouth daily.     latanoprost (XALATAN) 0.005 % ophthalmic solution SMARTSIG:1 Drop(s) In Eye(s) Every Evening     losartan-hydrochlorothiazide (HYZAAR) 100-25 MG tablet Take 1 tablet by mouth daily. 90 tablet 1   Multiple Vitamins-Minerals (MULTIVIT/MULTIMINERAL ADULT PO) Take by mouth.     Potassium (POTASSIMIN PO) Take by mouth.     pregabalin (LYRICA) 150 MG capsule Take 1 capsule (150 mg total) by mouth 2 (two) times daily.  180 capsule 1   Sennosides-Docusate Sodium (SENNA-DOCUSATE SODIUM PO) Take by mouth.     tamsulosin (FLOMAX) 0.4 MG CAPS capsule TAKE 1 CAPSULE(0.4 MG) BY MOUTH DAILY 30 capsule 3   testosterone cypionate (DEPOTESTOSTERONE CYPIONATE) 200 MG/ML injection Inject 200 mg into the muscle every 14 (fourteen) days.     No current facility-administered medications for this visit.    REVIEW OF SYSTEMS:    10 Point review of Systems was done is negative except as noted above.  PHYSICAL EXAMINATION: ECOG PERFORMANCE STATUS: 1 - Symptomatic but completely ambulatory  . Vitals:   04/26/22 1126  BP: (!) 165/91  Pulse: 73  Resp: 16  Temp: 97.9 F (36.6 C)  SpO2: 97%   Filed Weights   04/26/22 1126  Weight: 279 lb 4.8 oz (126.7 kg)   .Body mass index is 43.74 kg/m.  GENERAL:alert, in no acute distress and comfortable SKIN: no acute rashes, no significant lesions EYES: conjunctiva are pink and non-injected, sclera anicteric OROPHARYNX: MMM, no exudates, no oropharyngeal erythema or ulceration NECK: supple, no JVD LYMPH:  no palpable lymphadenopathy in the cervical, axillary or inguinal regions LUNGS: clear to auscultation b/l with normal respiratory effort HEART: regular rate & rhythm ABDOMEN:  normoactive bowel sounds , non tender, not distended. Extremity: no pedal edema PSYCH: alert & oriented x 3 with fluent speech NEURO: no focal motor/sensory deficits  LABORATORY DATA:  I have reviewed the data as listed .    Latest Ref Rng & Units 04/26/2022   12:08 PM 04/05/2022    4:21 PM 08/21/2021    3:20 PM  CBC  WBC 4.0 - 10.5 K/uL 7.4  6.8  7.5   Hemoglobin 13.0 - 17.0 g/dL 15.7  15.6  13.9   Hematocrit 39.0 - 52.0 % 46.6  47.8  41.8   Platelets 150 - 400 K/uL 177  197.0  203    .    Latest Ref Rng & Units 04/26/2022   12:08 PM 04/05/2022    4:21 PM 08/21/2021    3:20 PM  CMP  Glucose 70 - 99 mg/dL 90  88  97   BUN 8 - 23 mg/dL '18  21  18   '$ Creatinine 0.61 - 1.24 mg/dL  1.83  1.67  1.47   Sodium 135 - 145 mmol/L 139  140  140   Potassium 3.5 - 5.1 mmol/L 3.8  3.7  3.7   Chloride 98 - 111 mmol/L 104  104  100   CO2 22 - 32 mmol/L '29  27  31   '$ Calcium 8.9 - 10.3 mg/dL 9.0  9.5  9.3   Total Protein 6.5 - 8.1 g/dL 7.4  7.2  6.7   Total Bilirubin 0.3 -  1.2 mg/dL 0.4  0.5  0.5   Alkaline Phos 38 - 126 U/L 77  71    AST 15 - 41 U/L '18  20  16   '$ ALT 0 - 44 U/L '15  13  16     '$ RADIOGRAPHIC STUDIES: I have personally reviewed the radiological images as listed and agreed with the findings in the report. No results found.  ASSESSMENT & PLAN:   71 y.o. male with   1. 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  PLAN: -Discussed lab results from 04/05/2022. Labs showed WBC of 6.8 K, hemoglobin of 15.6 K, and platelets of 197. Stable CMP. -Discussed information on IgM MGUS with the patient and his wife.  -Discussed his CT scan from 2019- reviewed again..  -Answered all patient's and his wife's question about protein levels and MGUS. -Discussed the different reasons for his fatigue.  FOLLOW-UP: Labs today RTC with Dr Irene Limbo in 12 months with labs 1 week prior   .The total time spent in the appointment was 40 minutes* .  All of the patient's questions were answered with apparent satisfaction. The patient knows to call the clinic with any problems, questions or concerns.   Sullivan Lone MD MS AAHIVMS [facility] Hematology/Oncology Physician [facility]  .*Total Encounter Time as defined by the Centers for Medicare and Medicaid Services includes, in addition to the face-to-face time of a patient visit (documented in the note above) non-face-to-face time: obtaining and reviewing outside history, ordering and reviewing medications, tests or procedures, care coordination (communications with other health care professionals or caregivers) and documentation in the medical record.    Zettie Cooley, am acting as a scribe for Sullivan Lone,  MD.  Sullivan Lone MD [place] AAHIVMS [facility] Hematology/Oncology Physician [facility]  (Office):       [phone] (Work cell):  [phone] (Fax):           [phone]  04/26/2022 10:11 AM

## 2022-04-27 LAB — KAPPA/LAMBDA LIGHT CHAINS
Kappa free light chain: 40.3 mg/L — ABNORMAL HIGH (ref 3.3–19.4)
Kappa, lambda light chain ratio: 2.1 — ABNORMAL HIGH (ref 0.26–1.65)
Lambda free light chains: 19.2 mg/L (ref 5.7–26.3)

## 2022-04-29 LAB — MULTIPLE MYELOMA PANEL, SERUM
Albumin SerPl Elph-Mcnc: 3.6 g/dL (ref 2.9–4.4)
Albumin/Glob SerPl: 1.3 (ref 0.7–1.7)
Alpha 1: 0.2 g/dL (ref 0.0–0.4)
Alpha2 Glob SerPl Elph-Mcnc: 0.8 g/dL (ref 0.4–1.0)
B-Globulin SerPl Elph-Mcnc: 0.9 g/dL (ref 0.7–1.3)
Gamma Glob SerPl Elph-Mcnc: 1.1 g/dL (ref 0.4–1.8)
Globulin, Total: 3 g/dL (ref 2.2–3.9)
IgA: 229 mg/dL (ref 61–437)
IgG (Immunoglobin G), Serum: 956 mg/dL (ref 603–1613)
IgM (Immunoglobulin M), Srm: 175 mg/dL — ABNORMAL HIGH (ref 20–172)
M Protein SerPl Elph-Mcnc: 0.3 g/dL — ABNORMAL HIGH
Total Protein ELP: 6.6 g/dL (ref 6.0–8.5)

## 2022-05-03 ENCOUNTER — Ambulatory Visit: Payer: BC Managed Care – PPO | Admitting: Family Medicine

## 2022-05-17 ENCOUNTER — Other Ambulatory Visit: Payer: Self-pay | Admitting: Lab

## 2022-05-17 ENCOUNTER — Other Ambulatory Visit: Payer: Self-pay | Admitting: Family Medicine

## 2022-05-17 DIAGNOSIS — I10 Essential (primary) hypertension: Secondary | ICD-10-CM

## 2022-05-17 MED ORDER — HYDRALAZINE HCL 50 MG PO TABS: 50.0000 mg | ORAL_TABLET | Freq: Three times a day (TID) | ORAL | 1 refills | Status: DC

## 2022-05-20 ENCOUNTER — Other Ambulatory Visit: Payer: Self-pay | Admitting: Neurosurgery

## 2022-05-20 DIAGNOSIS — M4310 Spondylolisthesis, site unspecified: Secondary | ICD-10-CM

## 2022-06-12 ENCOUNTER — Ambulatory Visit
Admission: RE | Admit: 2022-06-12 | Discharge: 2022-06-12 | Disposition: A | Discharge status: Home / Self Care | Payer: BC Managed Care – PPO | Source: Ambulatory Visit | Attending: Neurosurgery | Admitting: Neurosurgery

## 2022-06-12 DIAGNOSIS — M4310 Spondylolisthesis, site unspecified: Secondary | ICD-10-CM

## 2022-06-23 ENCOUNTER — Other Ambulatory Visit: Payer: Self-pay | Admitting: Family Medicine

## 2022-06-23 DIAGNOSIS — R351 Nocturia: Secondary | ICD-10-CM

## 2022-07-20 ENCOUNTER — Telehealth: Payer: Self-pay | Admitting: Family Medicine

## 2022-07-20 DIAGNOSIS — M545 Low back pain, unspecified: Secondary | ICD-10-CM

## 2022-07-20 DIAGNOSIS — M792 Neuralgia and neuritis, unspecified: Secondary | ICD-10-CM

## 2022-07-20 DIAGNOSIS — M48061 Spinal stenosis, lumbar region without neurogenic claudication: Secondary | ICD-10-CM

## 2022-07-20 NOTE — Telephone Encounter (Signed)
Pt needs referral to [facility] for his back surgery previously referred to [facility]

## 2022-07-20 NOTE — Telephone Encounter (Signed)
Caller name: ALEXANDRA POSADAS  On DPR?: Yes  Call back number: [phone] (mobile)  Provider they see: Wendie Agreste, MD  Reason for call: Patient called states that his insurance will not cover his appt wit [facility] imaging. Patient states that his insurance will cover his appt with [facility]. Patient needs his referral sent to [facility].

## 2022-07-20 NOTE — Telephone Encounter (Signed)
Looks like he did have an appointment January 17 with neurosurgeon, Dr. Trenton Gammon. Is he requesting a back specialist evaluation through [facility]?  If so, then I can refer him to Dr. Rolena Infante at [facility] who does specialize in back, spine.  Initial message indicates that he does not have coverage through [facility] imaging which would be imaging facility, not specialist.  Please clarify.   I do not see any studies that were recently ordered, other than the MRI that has been completed December 30.

## 2022-07-21 NOTE — Addendum Note (Signed)
Addended by: Merri Ray R on: 07/21/2022 05:43 PM   Modules accepted: Orders

## 2022-07-21 NOTE — Telephone Encounter (Signed)
Patient reports was not covered with Dr Annette Stable needs a new specialist Not sure where mention of  imaging is from pt and I discuss [facility] vs Dr Gearldine Bienenstock office and insurance informed him he would be covered at [employer]

## 2022-07-22 NOTE — Telephone Encounter (Signed)
Referral has been sent to [facility]. If pt still has [facility] should take that insurance.

## 2022-08-04 ENCOUNTER — Other Ambulatory Visit: Payer: Self-pay | Admitting: Family Medicine

## 2022-08-04 DIAGNOSIS — M792 Neuralgia and neuritis, unspecified: Secondary | ICD-10-CM

## 2022-08-04 NOTE — Telephone Encounter (Signed)
Lyrica 150 mg LOV: 04/05/22 Last Refill:12/30/21 Upcoming appt: none

## 2022-08-04 NOTE — Telephone Encounter (Signed)
Lyrica, neuropathic pain discussed at October office visit.  Controlled substance database reviewed.  Pregabalin 150 mg #180 on 05/05/2022.  Refill ordered.

## 2022-08-25 ENCOUNTER — Other Ambulatory Visit: Payer: Self-pay | Admitting: Family Medicine

## 2022-08-25 DIAGNOSIS — M792 Neuralgia and neuritis, unspecified: Secondary | ICD-10-CM

## 2022-09-23 ENCOUNTER — Other Ambulatory Visit: Payer: Self-pay | Admitting: Family Medicine

## 2022-09-23 DIAGNOSIS — R351 Nocturia: Secondary | ICD-10-CM

## 2022-10-14 ENCOUNTER — Telehealth: Payer: BC Managed Care – PPO | Admitting: Physician Assistant

## 2022-10-14 DIAGNOSIS — J069 Acute upper respiratory infection, unspecified: Secondary | ICD-10-CM

## 2022-10-14 MED ORDER — DOXYCYCLINE HYCLATE 100 MG PO TABS: 100.0000 mg | ORAL_TABLET | Freq: Two times a day (BID) | ORAL | 0 refills | Status: DC

## 2022-10-14 MED ORDER — BENZONATATE 100 MG PO CAPS: 100.0000 mg | ORAL_CAPSULE | Freq: Three times a day (TID) | ORAL | 0 refills | Status: DC | PRN

## 2022-10-14 MED ORDER — FLUTICASONE PROPIONATE 50 MCG/ACT NA SUSP: 2.0000 | Freq: Every day | NASAL | 0 refills | Status: AC

## 2022-10-14 NOTE — Progress Notes (Signed)
Virtual Visit Consent   Jeffrey Parks, you are scheduled for a virtual visit with a [facility] provider today. Just as with appointments in the office, your consent must be obtained to participate. Your consent will be active for this visit and any virtual visit you may have with one of our providers in the next 365 days. If you have a MyChart account, a copy of this consent can be sent to you electronically.  As this is a virtual visit, video technology does not allow for your provider to perform a traditional examination. This may limit your provider's ability to fully assess your condition. If your provider identifies any concerns that need to be evaluated in person or the need to arrange testing (such as labs, EKG, etc.), we will make arrangements to do so. Although advances in technology are sophisticated, we cannot ensure that it will always work on either your end or our end. If the connection with a video visit is poor, the visit may have to be switched to a telephone visit. With either a video or telephone visit, we are not always able to ensure that we have a secure connection.  By engaging in this virtual visit, you consent to the provision of healthcare and authorize for your insurance to be billed (if applicable) for the services provided during this visit. Depending on your insurance coverage, you may receive a charge related to this service.  I need to obtain your verbal consent now. Are you willing to proceed with your visit today? Jeffrey Parks has provided verbal consent on 10/14/2022 for a virtual visit (video or telephone). Jeffrey Parks, [place]  Date: 10/14/2022 11:13 AM  Virtual Visit via Video Note   I, Jeffrey Parks, connected with  Jeffrey Parks  ([id number], 1951-02-25) on 10/14/22 at 11:00 AM EDT by a video-enabled telemedicine application and verified that I am speaking with the correct person using two identifiers.  Location: Patient: Virtual Visit  Location Patient: Home Provider: Virtual Visit Location Provider: Home Office   I discussed the limitations of evaluation and management by telemedicine and the availability of in person appointments. The patient expressed understanding and agreed to proceed.    History of Present Illness: Jeffrey Parks is a 72 y.o. who identifies as a male who was assigned male at birth, and is being seen today for 2-3 days of chest congestion and cough that is dry but at times slightly productive. Questionable low-grade fever at onset of symptoms. Denies sinus congestion, sinus pain or tooth pain. Denies chest pain or SOB. Taking Dayquil/Nyquil with improvement. Did have episode of feeling off balance but attributed it to taking medication on an empty stomach. No recurrence. Denies recent travel or sick contact.   HPI: HPI  Problems:  Patient Active Problem List   Diagnosis Date Noted   Chronic kidney disease, stage III (moderate) (HCC) 02/08/2018   Stenosis of cervical spine with myelopathy (HCC) 06/22/2017   Oropharyngeal dysphagia 06/22/2017   Diabetes mellitus type 2, uncomplicated (HCC) 05/31/2017   Gait instability 01/14/2017   Hyperreflexia 01/14/2017   Cervicalgia 01/14/2017   Leg swelling 09/08/2016   Sleep apnea 09/08/2016   Dizziness 09/08/2016   Fatigue 05/30/2015   Hypogonadism male 05/22/2015   Morbid obesity due to excess calories (HCC) 05/22/2015   Testosterone insufficiency 05/22/2015   Hypertension, uncontrolled 05/07/2015   Erectile dysfunction 05/07/2015    Allergies:  Allergies  Allergen Reactions   Norvasc [Amlodipine]    Medications:  Current Outpatient Medications:    benzonatate (TESSALON) 100 MG capsule, Take 1 capsule (100 mg total) by mouth 3 (three) times daily as needed for cough., Disp: 30 capsule, Rfl: 0   [START ON 10/16/2022] doxycycline (VIBRA-TABS) 100 MG tablet, Take 1 tablet (100 mg total) by mouth 2 (two) times daily., Disp: 20 tablet, Rfl: 0    fluticasone (FLONASE) 50 MCG/ACT nasal spray, Place 2 sprays into both nostrils daily., Disp: 16 g, Rfl: 0   Alprostadil (PROSTAGLANDIN E1) POWD, by Does not apply route., Disp: , Rfl:    atorvastatin (LIPITOR) 10 MG tablet, TAKE 1 TABLET(10 MG) BY MOUTH DAILY, Disp: 90 tablet, Rfl: 1   dapagliflozin propanediol (FARXIGA) 10 MG TABS tablet, Take 100 mg by mouth daily., Disp: , Rfl:    hydrALAZINE (APRESOLINE) 50 MG tablet, TAKE 1 TABLET(50 MG) BY MOUTH THREE TIMES DAILY, Disp: 270 tablet, Rfl: 1   hydrALAZINE (APRESOLINE) 50 MG tablet, Take 1 tablet (50 mg total) by mouth 3 (three) times daily., Disp: 270 tablet, Rfl: 1   latanoprost (XALATAN) 0.005 % ophthalmic solution, SMARTSIG:1 Drop(s) In Eye(s) Every Evening, Disp: , Rfl:    losartan-hydrochlorothiazide (HYZAAR) 100-25 MG tablet, Take 1 tablet by mouth daily., Disp: 90 tablet, Rfl: 1   Multiple Vitamins-Minerals (MULTIVIT/MULTIMINERAL ADULT PO), Take by mouth., Disp: , Rfl:    Potassium (POTASSIMIN PO), Take by mouth., Disp: , Rfl:    pregabalin (LYRICA) 150 MG capsule, TAKE 1 CAPSULE(150 MG) BY MOUTH TWICE DAILY, Disp: 180 capsule, Rfl: 1   Sennosides-Docusate Sodium (SENNA-DOCUSATE SODIUM PO), Take by mouth., Disp: , Rfl:    tamsulosin (FLOMAX) 0.4 MG CAPS capsule, TAKE 1 CAPSULE(0.4 MG) BY MOUTH DAILY, Disp: 30 capsule, Rfl: 3   testosterone cypionate (DEPOTESTOSTERONE CYPIONATE) 200 MG/ML injection, Inject 200 mg into the muscle every 14 (fourteen) days., Disp: , Rfl:   Observations/Objective: Patient is well-developed, well-nourished in no acute distress.  Resting comfortably  at home.  Head is normocephalic, atraumatic.  No labored breathing.  Speech is clear and coherent with logical content.  Patient is alert and oriented at baseline.   Assessment and Plan: 1. Viral URI with cough - benzonatate (TESSALON) 100 MG capsule; Take 1 capsule (100 mg total) by mouth 3 (three) times daily as needed for cough.  Dispense: 30 capsule;  Refill: 0 - fluticasone (FLONASE) 50 MCG/ACT nasal spray; Place 2 sprays into both nostrils daily.  Dispense: 16 g; Refill: 0  Short duration of symptoms, already improving on their own. Supportive measures and OTC medications reviewed. Tessalon and Flonase per orders. Giving concerns, did place script for Doxy to be filled after 48 hours if symptoms are not continuing to improve/resolve.  Follow Up Instructions: I discussed the assessment and treatment plan with the patient. The patient was provided an opportunity to ask questions and all were answered. The patient agreed with the plan and demonstrated an understanding of the instructions.  A copy of instructions were sent to the patient via MyChart unless otherwise noted below.   The patient was advised to call back or seek an in-person evaluation if the symptoms worsen or if the condition fails to improve as anticipated.  Time:  I spent 10 minutes with the patient via telehealth technology discussing the above problems/concerns.    Jeffrey Climes, PA-C

## 2022-10-14 NOTE — Patient Instructions (Signed)
Nehemiah E Weisberg, thank you for joining [name], PA-C for today's virtual visit.  While this provider is not your primary care provider (PCP), if your PCP is located in our provider database this encounter information will be shared with them immediately following your visit.   A [facility] MyChart account gives you access to today's visit and all your visits, tests, and labs performed at [facility] " click here if you don't have a [facility] MyChart account or go to [email or web address]  Consent: (Patient) Jeffrey Parks provided verbal consent for this virtual visit at the beginning of the encounter.  Current Medications:  Current Outpatient Medications:    benzonatate (TESSALON) 100 MG capsule, Take 1 capsule (100 mg total) by mouth 3 (three) times daily as needed for cough., Disp: 30 capsule, Rfl: 0   [START ON 10/16/2022] doxycycline (VIBRA-TABS) 100 MG tablet, Take 1 tablet (100 mg total) by mouth 2 (two) times daily., Disp: 20 tablet, Rfl: 0   fluticasone (FLONASE) 50 MCG/ACT nasal spray, Place 2 sprays into both nostrils daily., Disp: 16 g, Rfl: 0   Alprostadil (PROSTAGLANDIN E1) POWD, by Does not apply route., Disp: , Rfl:    atorvastatin (LIPITOR) 10 MG tablet, TAKE 1 TABLET(10 MG) BY MOUTH DAILY, Disp: 90 tablet, Rfl: 1   dapagliflozin propanediol (FARXIGA) 10 MG TABS tablet, Take 100 mg by mouth daily., Disp: , Rfl:    hydrALAZINE (APRESOLINE) 50 MG tablet, TAKE 1 TABLET(50 MG) BY MOUTH THREE TIMES DAILY, Disp: 270 tablet, Rfl: 1   hydrALAZINE (APRESOLINE) 50 MG tablet, Take 1 tablet (50 mg total) by mouth 3 (three) times daily., Disp: 270 tablet, Rfl: 1   latanoprost (XALATAN) 0.005 % ophthalmic solution, SMARTSIG:1 Drop(s) In Eye(s) Every Evening, Disp: , Rfl:    losartan-hydrochlorothiazide (HYZAAR) 100-25 MG tablet, Take 1 tablet by mouth daily., Disp: 90 tablet, Rfl: 1   Multiple Vitamins-Minerals (MULTIVIT/MULTIMINERAL ADULT PO), Take by  mouth., Disp: , Rfl:    Potassium (POTASSIMIN PO), Take by mouth., Disp: , Rfl:    pregabalin (LYRICA) 150 MG capsule, TAKE 1 CAPSULE(150 MG) BY MOUTH TWICE DAILY, Disp: 180 capsule, Rfl: 1   Sennosides-Docusate Sodium (SENNA-DOCUSATE SODIUM PO), Take by mouth., Disp: , Rfl:    tamsulosin (FLOMAX) 0.4 MG CAPS capsule, TAKE 1 CAPSULE(0.4 MG) BY MOUTH DAILY, Disp: 30 capsule, Rfl: 3   testosterone cypionate (DEPOTESTOSTERONE CYPIONATE) 200 MG/ML injection, Inject 200 mg into the muscle every 14 (fourteen) days., Disp: , Rfl:    Medications ordered in this encounter:  Meds ordered this encounter  Medications   benzonatate (TESSALON) 100 MG capsule    Sig: Take 1 capsule (100 mg total) by mouth 3 (three) times daily as needed for cough.    Dispense:  30 capsule    Refill:  0    Order Specific Question:   Supervising Provider    Answer:   Merrilee Jansky [[id number]]   fluticasone (FLONASE) 50 MCG/ACT nasal spray    Sig: Place 2 sprays into both nostrils daily.    Dispense:  16 g    Refill:  0    Order Specific Question:   Supervising Provider    Answer:   Merrilee Jansky [id number]   doxycycline (VIBRA-TABS) 100 MG tablet    Sig: Take 1 tablet (100 mg total) by mouth 2 (two) times daily.    Dispense:  20 tablet    Refill:  0    Fill only if patient requests.  Order Specific Question:   Supervising Provider    Answer:   Merrilee Jansky [[id number]]     *If you need refills on other medications prior to your next appointment, please contact your pharmacy*  Follow-Up: Call back or seek an in-person evaluation if the symptoms worsen or if the condition fails to improve as anticipated.  [facility] [phone]  Other Instructions   Increase fluids.  Get plenty of rest. Use Mucinex for congestion. Start the [employer] as directed. Take a daily probiotic (I recommend Align or Culturelle, but even Activia Yogurt may be beneficial).  A humidifier placed in the  bedroom may offer some relief for a dry, scratchy throat of nasal irritation.  Read information below on acute bronchitis. If symptoms are not continuing to improve over the weekend, you can then fill antibiotic I have placed on file for you, taking as directed. Please call or return to clinic if symptoms are not improving.  Acute Bronchitis Bronchitis is when the airways that extend from the windpipe into the lungs get red, puffy, and painful (inflamed). Bronchitis often causes thick spit (mucus) to develop. This leads to a cough. A cough is the most common symptom of bronchitis. In acute bronchitis, the condition usually begins suddenly and goes away over time (usually in 2 weeks). Smoking, allergies, and asthma can make bronchitis worse. Repeated episodes of bronchitis may cause more lung problems.  HOME CARE Rest. Drink enough fluids to keep your pee (urine) clear or pale yellow (unless you need to limit fluids as told by your doctor). Only take over-the-counter or prescription medicines as told by your doctor. Avoid smoking and secondhand smoke. These can make bronchitis worse. If you are a smoker, think about using nicotine gum or skin patches. Quitting smoking will help your lungs heal faster. Reduce the chance of getting bronchitis again by: Washing your hands often. Avoiding people with cold symptoms. Trying not to touch your hands to your mouth, nose, or eyes. Follow up with your doctor as told.  GET HELP IF: Your symptoms do not improve after 1 week of treatment. Symptoms include: Cough. Fever. Coughing up thick spit. Body aches. Chest congestion. Chills. Shortness of breath. Sore throat.  GET HELP RIGHT AWAY IF:  You have an increased fever. You have chills. You have severe shortness of breath. You have bloody thick spit (sputum). You throw up (vomit) often. You lose too much body fluid (dehydration). You have a severe headache. You faint.  MAKE SURE YOU:   Understand these instructions. Will watch your condition. Will get help right away if you are not doing well or get worse. Document Released: 11/17/2007 Document Revised: 01/31/2013 Document Reviewed: 11/21/2012 [facility] Patient Information 2015 [place], [place]. This information is not intended to replace advice given to you by your health care provider. Make sure you discuss any questions you have with your health care provider.    If you have been instructed to have an in-person evaluation today at a local Urgent Care facility, please use the link below. It will take you to a list of all of our available [facility], including address, phone number and hours of operation. Please do not delay care.  [facility]  If you or a family member do not have a primary care provider, use the link below to schedule a visit and establish care. When you choose a [facility] primary care physician or advanced practice provider, you gain a  long-term partner in health. Find a Primary Care Provider  Learn more about [facility]'s in-office and virtual care options: [facility] - Get Care Now

## 2022-11-20 ENCOUNTER — Other Ambulatory Visit: Payer: Self-pay | Admitting: Family Medicine

## 2022-11-20 DIAGNOSIS — I10 Essential (primary) hypertension: Secondary | ICD-10-CM

## 2023-01-29 ENCOUNTER — Other Ambulatory Visit: Payer: Self-pay | Admitting: Family Medicine

## 2023-01-29 DIAGNOSIS — M792 Neuralgia and neuritis, unspecified: Secondary | ICD-10-CM

## 2023-01-29 DIAGNOSIS — I10 Essential (primary) hypertension: Secondary | ICD-10-CM

## 2023-01-31 ENCOUNTER — Other Ambulatory Visit: Payer: Self-pay | Admitting: Family Medicine

## 2023-01-31 DIAGNOSIS — I10 Essential (primary) hypertension: Secondary | ICD-10-CM

## 2023-01-31 NOTE — Telephone Encounter (Signed)
Patient last seen in October 2023 and recommended 1 month follow-up at that time.  Controlled substance database reviewed.  Pregabalin last filled for #180 on 10/2122.    30-day supply given, needs office visit.

## 2023-01-31 NOTE — Telephone Encounter (Signed)
Lyrica 150 mg Requested Prescriptions   Pending Prescriptions Disp Refills   pregabalin (LYRICA) 150 MG capsule [Pharmacy Med Name: PREGABALIN 150MG  CAPSULES] 180 capsule     Sig: TAKE 1 CAPSULE(150 MG) BY MOUTH TWICE DAILY   losartan-hydrochlorothiazide (HYZAAR) 100-25 MG tablet [Pharmacy Med Name: LOSARTAN/HCTZ 100/25MG  TABLETS] 90 tablet 1    Sig: TAKE 1 TABLET BY MOUTH DAILY     Date of patient request: 01/31/23 Last office visit: 04/05/22 Date of last refill: 08/04/22 Last refill amount: 180 Follow up time period per chart: 1 month

## 2023-03-07 ENCOUNTER — Other Ambulatory Visit: Payer: Self-pay | Admitting: Family Medicine

## 2023-03-07 DIAGNOSIS — R351 Nocturia: Secondary | ICD-10-CM

## 2023-03-07 NOTE — Telephone Encounter (Signed)
Last refill 03/07/2023

## 2023-03-09 ENCOUNTER — Other Ambulatory Visit: Payer: Self-pay | Admitting: Family Medicine

## 2023-03-09 DIAGNOSIS — M792 Neuralgia and neuritis, unspecified: Secondary | ICD-10-CM

## 2023-03-11 ENCOUNTER — Telehealth: Payer: Self-pay | Admitting: Family Medicine

## 2023-03-11 DIAGNOSIS — M792 Neuralgia and neuritis, unspecified: Secondary | ICD-10-CM

## 2023-03-11 MED ORDER — PREGABALIN 150 MG PO CAPS
150.0000 mg | ORAL_CAPSULE | Freq: Two times a day (BID) | ORAL | 0 refills | Status: DC
Start: 2023-03-11 — End: 2023-03-29

## 2023-03-11 NOTE — Addendum Note (Signed)
Addended by: Meredith Staggers R on: 03/11/2023 04:51 PM   Modules accepted: Orders

## 2023-03-11 NOTE — Telephone Encounter (Signed)
Last office visit 04/05/2022 No upcoming appt

## 2023-03-11 NOTE — Telephone Encounter (Signed)
Lyrica refill request noted.  Last office visit with me was in October 2023.  Advised to follow-up in 1 month which has not occurred.  I did refill this medication for a 30-day supply August 19 with instructions for office visit.  At this point there is no office visit scheduled with me.  If he does schedule a visit I would consider a short-term 30-day supply but then no further refills until seen in office.  Let me know if he does schedule a visit and I can look at refilling medication temporarily.

## 2023-03-11 NOTE — Telephone Encounter (Signed)
Appt 03/21/23

## 2023-03-11 NOTE — Telephone Encounter (Signed)
Noted.  Will need to keep that visit.  Controlled substance database reviewed, last filled 01/31/2023, refill ordered.

## 2023-03-11 NOTE — Telephone Encounter (Signed)
Encourage patient to contact the pharmacy for refills or they can request refills through [facility]  (Please schedule appointment if patient has not been seen in over a year)    WHAT PHARMACY WOULD THEY LIKE THIS SENT TO: [id number] - [place], [place] - [address] AT [facility] [address]   MEDICATION NAME & DOSE: pregabalin (LYRICA) 150 MG capsule   NOTES/COMMENTS FROM PATIENT: Pt needs a refill on this medication, he is completely out      Front office please notify patient: It takes 48-72 hours to process rx refill requests Ask patient to call pharmacy to ensure rx is ready before heading there.

## 2023-03-14 LAB — HM DIABETES EYE EXAM

## 2023-03-14 NOTE — Telephone Encounter (Signed)
Pt has been notified.

## 2023-03-21 ENCOUNTER — Ambulatory Visit: Payer: BC Managed Care – PPO | Admitting: Family Medicine

## 2023-03-21 VITALS — BP 130/78 | HR 68 | Temp 97.6°F | Ht 67.0 in | Wt 267.8 lb

## 2023-03-21 DIAGNOSIS — G8929 Other chronic pain: Secondary | ICD-10-CM

## 2023-03-21 DIAGNOSIS — Z7984 Long term (current) use of oral hypoglycemic drugs: Secondary | ICD-10-CM

## 2023-03-21 DIAGNOSIS — I10 Essential (primary) hypertension: Secondary | ICD-10-CM | POA: Diagnosis not present

## 2023-03-21 DIAGNOSIS — D472 Monoclonal gammopathy: Secondary | ICD-10-CM

## 2023-03-21 DIAGNOSIS — E1122 Type 2 diabetes mellitus with diabetic chronic kidney disease: Secondary | ICD-10-CM

## 2023-03-21 DIAGNOSIS — R600 Localized edema: Secondary | ICD-10-CM

## 2023-03-21 DIAGNOSIS — M545 Low back pain, unspecified: Secondary | ICD-10-CM

## 2023-03-21 DIAGNOSIS — N183 Chronic kidney disease, stage 3 unspecified: Secondary | ICD-10-CM

## 2023-03-21 NOTE — Progress Notes (Unsigned)
Subjective:  Patient ID: Jeffrey Parks, male    DOB: October 10, 1950  Age: 72 y.o. MRN: [id number]  CC:  Chief Complaint  Patient presents with   Medical Management of Chronic Issues    Pt due for some refills, no concerns     HPI Jeffrey Parks presents for  Chronic condition follow-up and med refill.  Last visit with me in October 2023, Advised 1 month follow-up at that time.  Hypertension: With chronic kidney disease followed by nephrology.  Previous furosemide has been changed to torsemide when discussed last fever and was taking hydralazine 50 mg 3 times daily. Note reviewed from [facility] on 03/09/2023, Katie Stovall, [place].  Stage III CKD.  Followed by Dr. Marisue Humble.  Had been started on Coreg 12.5 mg twice daily at previous visit.  Torsemide 1-2 times weekly for edema.  Continued on Kerendia, carvedilol, losartan 100 mg daily, and continued Farxiga, potassium 20 mill equivalents daily. Appt with Dr. Marisue Humble in next 2 months. Off hydralazine - was discontinued  Home readings: variable. 130/70-80 range or lower. Up some with doctor visits or when stressed.  BP Readings from Last 3 Encounters:  03/21/23 130/78  04/26/22 (!) 165/91  04/05/22 (!) 130/90   Lab Results  Component Value Date   CREATININE 1.83 (H) 04/26/2022   Chronic low back pain with lumbar radiculopathy Followed by Dr.Ramos, Dr Shon Baton. with recent appointment August 31 with Dr. Ethelene Hal.  Plan for second opinion - Dr. Ethelene Hal will be arranging.   Lyrica 150 mg twice daily - managing  restless legs. Doing well. No new side effects. No change in swelling. Some salt on foods.   Diabetes: With chronic kidney disease followed by nephrology as above.  Treated with Farxiga 10 mg daily.  Previously on metformin, stopped with change to [place]. Home reading around 95 a few weeks ago.  Microalbumin: Elevated ratio of 49.7 on 10/23/202. Repeat today.   Optho, foot exam, pneumovax:  Optho last week.  No new  lesions or wounds.  Flu vaccine at nephrology recently.  stopped taking statin - for some time - unknown reason.    Persistent LE edema - he wonders about blood flow in legs and if it is ok.  Unsteady at times when first stands, plans separate visit, denies recent changes.   Lab Results  Component Value Date   HGBA1C 6.6 (H) 04/05/2022   HGBA1C 6.8 (A) 12/30/2021   HGBA1C 6.4 (H) 07/03/2021   Lab Results  Component Value Date   MICROALBUR 38.5 (H) 04/05/2022   LDLCALC 43 04/05/2022   CREATININE 1.83 (H) 04/26/2022    Monoclonal gammopathy. Discussed last October, had not seen hematology recently.  33-month follow-up had been recommended by previous hematology note, referral placed last October. Saw Dr. Candise Che 04/26/22 - follow up in 1 year. - appt on 04/27/23, with bloodwork 1 week prior.  Lab Results  Component Value Date   WBC 7.4 04/26/2022   HGB 15.7 04/26/2022   HCT 46.6 04/26/2022   MCV 85.7 04/26/2022   PLT 177 04/26/2022      History Patient Active Problem List   Diagnosis Date Noted   Chronic kidney disease, stage III (moderate) (HCC) 02/08/2018   Stenosis of cervical spine with myelopathy (HCC) 06/22/2017   Oropharyngeal dysphagia 06/22/2017   Diabetes mellitus type 2, uncomplicated (HCC) 05/31/2017   Gait instability 01/14/2017   Hyperreflexia 01/14/2017   Cervicalgia 01/14/2017   Leg swelling 09/08/2016   Sleep apnea 09/08/2016  Dizziness 09/08/2016   Fatigue 05/30/2015   Hypogonadism male 05/22/2015   Morbid obesity due to excess calories (HCC) 05/22/2015   Testosterone insufficiency 05/22/2015   Hypertension, uncontrolled 05/07/2015   Erectile dysfunction 05/07/2015   Past Medical History:  Diagnosis Date   Hypertension    Low testosterone    Sickle cell trait [facility])    Past Surgical History:  Procedure Laterality Date   neck surgery, Dec. 14,2018     None     Allergies  Allergen Reactions   Norvasc [Amlodipine]    Prior to Admission  medications   Medication Sig Start Date End Date Taking? Authorizing Provider  Alprostadil (PROSTAGLANDIN E1) POWD by Does not apply route.   Yes [provider]  dapagliflozin propanediol (FARXIGA) 10 MG TABS tablet Take 100 mg by mouth daily.   Yes [provider]  doxycycline (VIBRA-TABS) 100 MG tablet Take 1 tablet (100 mg total) by mouth 2 (two) times daily. 10/16/22  Yes Waldon Merl, PA-C  fluticasone (FLONASE) 50 MCG/ACT nasal spray Place 2 sprays into both nostrils daily. 10/14/22  Yes Waldon Merl, PA-C  hydrALAZINE (APRESOLINE) 50 MG tablet TAKE 1 TABLET(50 MG) BY MOUTH THREE TIMES DAILY 05/17/22  Yes Shade Flood, MD  hydrALAZINE (APRESOLINE) 50 MG tablet Take 1 tablet (50 mg total) by mouth 3 (three) times daily. 05/17/22  Yes Shade Flood, MD  latanoprost (XALATAN) 0.005 % ophthalmic solution SMARTSIG:1 Drop(s) In Eye(s) Every Evening 06/08/21  Yes [provider]  losartan-hydrochlorothiazide (HYZAAR) 100-25 MG tablet TAKE 1 TABLET BY MOUTH DAILY 01/31/23  Yes Shade Flood, MD  Multiple Vitamins-Minerals (MULTIVIT/MULTIMINERAL ADULT PO) Take by mouth.   Yes [provider]  Potassium (POTASSIMIN PO) Take by mouth.   Yes [provider]  pregabalin (LYRICA) 150 MG capsule Take 1 capsule (150 mg total) by mouth 2 (two) times daily. TAKE 1 CAPSULE(150 MG) BY MOUTH TWICE DAILY 03/11/23  Yes Shade Flood, MD  Sennosides-Docusate Sodium (SENNA-DOCUSATE SODIUM PO) Take by mouth.   Yes [provider]  tamsulosin (FLOMAX) 0.4 MG CAPS capsule TAKE 1 CAPSULE(0.4 MG) BY MOUTH DAILY 03/07/23  Yes Shade Flood, MD  testosterone cypionate (DEPOTESTOSTERONE CYPIONATE) 200 MG/ML injection Inject 200 mg into the muscle every 14 (fourteen) days.   Yes [provider]  atorvastatin (LIPITOR) 10 MG tablet TAKE 1 TABLET(10 MG) BY MOUTH DAILY Patient not taking: Reported on 03/21/2023 04/05/22   Shade Flood, MD   benzonatate (TESSALON) 100 MG capsule Take 1 capsule (100 mg total) by mouth 3 (three) times daily as needed for cough. Patient not taking: Reported on 03/21/2023 10/14/22   Waldon Merl, PA-C   Social History   Socioeconomic History   Marital status: Single    Spouse name: Not on file   Number of children: Not on file   Years of education: Not on file   Highest education level: Not on file  Occupational History   Not on file  Tobacco Use   Smoking status: Former    Types: Cigarettes   Smokeless tobacco: Never   Tobacco comments:    Quit two years ago.  Light smoker prior.   Vaping Use   Vaping status: Never Used  Substance and Sexual Activity   Alcohol use: Yes    Alcohol/week: 3.0 standard drinks of alcohol    Types: 3 Standard drinks or equivalent per week   Drug use: No   Sexual activity: Not on file  Other Topics Concern   Not on file  Social History Narrative   Lives with wife, 2 adult children and 3 grandchildren   Has 2 deceased children - 4 children total   Has PhD in literature   Professor at [employer]   Social Determinants of Corporate investment banker Strain: Not on [employer] Insecurity: Not on file  Transportation Needs: Not on file  Physical Activity: Not on file  Stress: Not on file  Social Connections: Not on file  Intimate Partner Violence: Not on file    Review of Systems  Per HPI Objective:   Vitals:   03/21/23 1506  BP: 130/78  Pulse: 68  Temp: 97.6 F (36.4 C)  TempSrc: Temporal  SpO2: 97%  Weight: 267 lb 12.8 oz (121.5 kg)  Height: 5\' 7"  (1.702 m)     Physical Exam Vitals reviewed.  Constitutional:      Appearance: He is well-developed.  HENT:     Head: Normocephalic and atraumatic.  Neck:     Vascular: No carotid bruit or JVD.  Cardiovascular:     Rate and Rhythm: Normal rate and regular rhythm.     Heart sounds: Normal heart sounds. No murmur heard. Pulmonary:     Effort: Pulmonary effort is normal.      Breath sounds: Normal breath sounds. No rales.  Musculoskeletal:     Right lower leg: Edema (Lower legs bilaterally with stasis changes, firm skin, no wounds.) present.     Left lower leg: Edema present.  Skin:    General: Skin is warm and dry.  Neurological:     Mental Status: He is alert and oriented to person, place, and time.  Psychiatric:        Mood and Affect: Mood normal.      46 minutes spent during visit, including chart review, review of nephrology notes, counseling and assimilation of information, exam, discussion of plan including for his edema, and chart completion.    Assessment & Plan:  Jeffrey Parks is a 72 y.o. male . Type 2 diabetes mellitus with chronic kidney disease, without long-term current use of insulin, unspecified CKD stage (HCC) - Plan: Microalbumin / creatinine urine ratio, Comprehensive metabolic panel, Hemoglobin A1c, Lipid panel  - check labs.  Most recent labs are stable.  Adjust plan accordingly based on results.    Essential hypertension  -With CKD, stable on current regimen, continue routine follow-up with nephrology as planned.  Medication list reconciled.  MGUS (monoclonal gammopathy of unknown significance)  -Visit with hematology/oncology last year noted, 1 year follow-up planned.  Stage 3 chronic kidney disease, unspecified whether stage 3a or 3b CKD (HCC)  -Ongoing care with nephrology, on Kerendia, SGLT2, ARB.  Chronic low back pain, unspecified back pain laterality, unspecified whether sciatica present  -Continue follow-up with physical medicine rehab with planned second opinion coordinated by their office.  Stable restless leg symptoms with current dose of Lyrica, continue same. Discussed potential edema with Lyrica but likely other contributors as below.  No med changes for now.  Pedal edema - Plan: Ambulatory referral to Vascular Surgery  -Likely component of chronic venous stasis, on Lyrica as above, chronic kidney disease, but  also discussed sodium avoidance as has been recommended by nephrology as well.  Handout given on pedal edema, handout given on salty 6 foods to avoid.  Refer to vascular surgery to decide on further testing and recommendations.  Has torsemide as needed for edema.  No orders of  the defined types were placed in this encounter.  Patient Instructions  See information below regarding swelling in the legs.  Make sure to avoid added salt to your foods and the handout regarding some higher salt foods that can also be avoided.  Fluid pill as needed when swelling has increased, and elevate legs when seated to help with that swelling.  I will also refer you to a vascular specialist to determine if other testing or recommendations needed.   Follow-up in 2 weeks and we can review your lab results, discussed your cholesterol and the previous cholesterol medication and discuss balance concern further.  If any new or worsening symptoms be seen right away.  Take care and thank you for coming in today.  Peripheral Edema  Peripheral edema is swelling that is caused by a buildup of fluid. Peripheral edema most often affects the lower legs, ankles, and feet. It can also develop in the arms, hands, and face. The area of the body that has peripheral edema will look swollen. It may also feel heavy or warm. Your clothes may start to feel tight. Pressing on the area may make a temporary dent in your skin (pitting edema). You may not be able to move your swollen arm or leg as much as usual. There are many causes of peripheral edema. It can happen because of a complication of other conditions such as heart failure, kidney disease, or a problem with your circulation. It also can be a side effect of certain medicines or happen because of an infection. It often happens to women during pregnancy. Sometimes, the cause is not known. Follow these instructions at home: Managing pain, stiffness, and swelling  Raise (elevate) your legs  while you are sitting or lying down. Move around often to prevent stiffness and to reduce swelling. Do not sit or stand for long periods of time. Do not wear tight clothing. Do not wear garters on your upper legs. Exercise your legs to get your circulation going. This helps to move the fluid back into your blood vessels, and it may help the swelling go down. Wear compression stockings as told by your health care provider. These stockings help to prevent blood clots and reduce swelling in your legs. It is important that these are the correct size. These stockings should be prescribed by your doctor to prevent possible injuries. If elastic bandages or wraps are recommended, use them as told by your health care provider. Medicines Take over-the-counter and prescription medicines only as told by your health care provider. Your health care provider may prescribe medicine to help your body get rid of excess water (diuretic). Take this medicine if you are told to take it. General instructions Eat a low-salt (low-sodium) diet as told by your health care provider. Sometimes, eating less salt may reduce swelling. Pay attention to any changes in your symptoms. Moisturize your skin daily to help prevent skin from cracking and draining. Keep all follow-up visits. This is important. Contact a health care provider if: You have a fever. You have swelling in only one leg. You have increased swelling, redness, or pain in one or both of your legs. You have drainage or sores at the area where you have edema. Get help right away if: You have edema that starts suddenly or is getting worse, especially if you are pregnant or have a medical condition. You develop shortness of breath, especially when you are lying down. You have pain in your chest or abdomen. You feel weak.  You feel like you will faint. These symptoms may be an emergency. Get help right away. Call 911. Do not wait to see if the symptoms will go  away. Do not drive yourself to the hospital. Summary Peripheral edema is swelling that is caused by a buildup of fluid. Peripheral edema most often affects the lower legs, ankles, and feet. Move around often to prevent stiffness and to reduce swelling. Do not sit or stand for long periods of time. Pay attention to any changes in your symptoms. Contact a health care provider if you have edema that starts suddenly or is getting worse, especially if you are pregnant or have a medical condition. Get help right away if you develop shortness of breath, especially when lying down. This information is not intended to replace advice given to you by your health care provider. Make sure you discuss any questions you have with your health care provider. Document Revised: 02/02/2021 Document Reviewed: 02/02/2021 Elsevier Patient Education  2024 Elsevier Inc.     Signed,   Meredith Staggers, MD [facility], [facility] 03/21/23 3:31 PM

## 2023-03-21 NOTE — Patient Instructions (Addendum)
See information below regarding swelling in the legs.  Make sure to avoid added salt to your foods and the handout regarding some higher salt foods that can also be avoided.  Fluid pill as needed when swelling has increased, and elevate legs when seated to help with that swelling.  I will also refer you to a vascular specialist to determine if other testing or recommendations needed.   Follow-up in 2 weeks and we can review your lab results, discussed your cholesterol and the previous cholesterol medication and discuss balance concern further.  If any new or worsening symptoms be seen right away.  Take care and thank you for coming in today.  Peripheral Edema  Peripheral edema is swelling that is caused by a buildup of fluid. Peripheral edema most often affects the lower legs, ankles, and feet. It can also develop in the arms, hands, and face. The area of the body that has peripheral edema will look swollen. It may also feel heavy or warm. Your clothes may start to feel tight. Pressing on the area may make a temporary dent in your skin (pitting edema). You may not be able to move your swollen arm or leg as much as usual. There are many causes of peripheral edema. It can happen because of a complication of other conditions such as heart failure, kidney disease, or a problem with your circulation. It also can be a side effect of certain medicines or happen because of an infection. It often happens to women during pregnancy. Sometimes, the cause is not known. Follow these instructions at home: Managing pain, stiffness, and swelling  Raise (elevate) your legs while you are sitting or lying down. Move around often to prevent stiffness and to reduce swelling. Do not sit or stand for long periods of time. Do not wear tight clothing. Do not wear garters on your upper legs. Exercise your legs to get your circulation going. This helps to move the fluid back into your blood vessels, and it may help the  swelling go down. Wear compression stockings as told by your health care provider. These stockings help to prevent blood clots and reduce swelling in your legs. It is important that these are the correct size. These stockings should be prescribed by your doctor to prevent possible injuries. If elastic bandages or wraps are recommended, use them as told by your health care provider. Medicines Take over-the-counter and prescription medicines only as told by your health care provider. Your health care provider may prescribe medicine to help your body get rid of excess water (diuretic). Take this medicine if you are told to take it. General instructions Eat a low-salt (low-sodium) diet as told by your health care provider. Sometimes, eating less salt may reduce swelling. Pay attention to any changes in your symptoms. Moisturize your skin daily to help prevent skin from cracking and draining. Keep all follow-up visits. This is important. Contact a health care provider if: You have a fever. You have swelling in only one leg. You have increased swelling, redness, or pain in one or both of your legs. You have drainage or sores at the area where you have edema. Get help right away if: You have edema that starts suddenly or is getting worse, especially if you are pregnant or have a medical condition. You develop shortness of breath, especially when you are lying down. You have pain in your chest or abdomen. You feel weak. You feel like you will faint. These symptoms may be an emergency.  Get help right away. Call 911. Do not wait to see if the symptoms will go away. Do not drive yourself to the hospital. Summary Peripheral edema is swelling that is caused by a buildup of fluid. Peripheral edema most often affects the lower legs, ankles, and feet. Move around often to prevent stiffness and to reduce swelling. Do not sit or stand for long periods of time. Pay attention to any changes in your  symptoms. Contact a health care provider if you have edema that starts suddenly or is getting worse, especially if you are pregnant or have a medical condition. Get help right away if you develop shortness of breath, especially when lying down. This information is not intended to replace advice given to you by your health care provider. Make sure you discuss any questions you have with your health care provider. Document Revised: 02/02/2021 Document Reviewed: 02/02/2021 Elsevier Patient Education  2024 [employer].

## 2023-03-22 ENCOUNTER — Encounter: Payer: Self-pay | Admitting: Family Medicine

## 2023-03-22 LAB — COMPREHENSIVE METABOLIC PANEL
ALT: 17 U/L (ref 0–53)
AST: 18 U/L (ref 0–37)
Albumin: 4.2 g/dL (ref 3.5–5.2)
Alkaline Phosphatase: 94 U/L (ref 39–117)
BUN: 17 mg/dL (ref 6–23)
CO2: 27 meq/L (ref 19–32)
Calcium: 9.6 mg/dL (ref 8.4–10.5)
Chloride: 105 meq/L (ref 96–112)
Creatinine, Ser: 1.62 mg/dL — ABNORMAL HIGH (ref 0.40–1.50)
GFR: 42.66 mL/min — ABNORMAL LOW (ref >60.00)
Glucose, Bld: 80 mg/dL (ref 70–99)
Potassium: 3.7 meq/L (ref 3.5–5.1)
Sodium: 142 meq/L (ref 135–145)
Total Bilirubin: 0.3 mg/dL (ref 0.2–1.2)
Total Protein: 7.1 g/dL (ref 6.0–8.3)

## 2023-03-22 LAB — LIPID PANEL
Cholesterol: 181 mg/dL (ref 0–200)
HDL: 41.2 mg/dL (ref >39.00)
LDL Cholesterol: 104 mg/dL — ABNORMAL HIGH (ref 0–99)
NonHDL: 139.35
Total CHOL/HDL Ratio: 4
Triglycerides: 175 mg/dL — ABNORMAL HIGH (ref 0.0–149.0)
VLDL: 35 mg/dL (ref 0.0–40.0)

## 2023-03-22 LAB — HEMOGLOBIN A1C: Hgb A1c MFr Bld: 6.5 % (ref 4.6–6.5)

## 2023-03-23 LAB — MICROALBUMIN / CREATININE URINE RATIO
Creatinine,U: 69.1 mg/dL
Microalb Creat Ratio: 88.9 mg/g — ABNORMAL HIGH (ref 0.0–30.0)
Microalb, Ur: 61.4 mg/dL — ABNORMAL HIGH (ref 0.0–1.9)

## 2023-03-28 ENCOUNTER — Ambulatory Visit: Payer: BC Managed Care – PPO | Admitting: Family Medicine

## 2023-03-28 ENCOUNTER — Encounter: Payer: Self-pay | Admitting: Family Medicine

## 2023-03-28 VITALS — BP 132/82 | HR 69 | Temp 97.8°F | Ht 67.0 in | Wt 267.4 lb

## 2023-03-28 DIAGNOSIS — R2681 Unsteadiness on feet: Secondary | ICD-10-CM

## 2023-03-28 DIAGNOSIS — M792 Neuralgia and neuritis, unspecified: Secondary | ICD-10-CM

## 2023-03-28 DIAGNOSIS — M545 Low back pain, unspecified: Secondary | ICD-10-CM | POA: Diagnosis not present

## 2023-03-28 DIAGNOSIS — E1122 Type 2 diabetes mellitus with diabetic chronic kidney disease: Secondary | ICD-10-CM | POA: Diagnosis not present

## 2023-03-28 DIAGNOSIS — R600 Localized edema: Secondary | ICD-10-CM

## 2023-03-28 DIAGNOSIS — R5383 Other fatigue: Secondary | ICD-10-CM

## 2023-03-28 DIAGNOSIS — E785 Hyperlipidemia, unspecified: Secondary | ICD-10-CM | POA: Diagnosis not present

## 2023-03-28 DIAGNOSIS — G8929 Other chronic pain: Secondary | ICD-10-CM

## 2023-03-28 MED ORDER — ATORVASTATIN CALCIUM 10 MG PO TABS: ORAL_TABLET | ORAL | 1 refills | Status: AC

## 2023-03-28 NOTE — Patient Instructions (Addendum)
Torsemide more often may help with swelling, but keep follow up with vascular specialist as planned - let me know if you have not received a call for an appointment in the next week.   Restart atorvastatin once per day for cholesterol, and we can recheck labs in 6 weeks.   As we discussed Lyrica could be responsible for a few of your symptoms including the leg swelling, fatigue, and unsteadiness.  Lets try a lower dose of 100 mg twice per day for now, 6-week follow-up.  If worsening of symptom control can return to 150 mg dose if needed.  Let me know if there are questions.

## 2023-03-28 NOTE — Progress Notes (Unsigned)
Subjective:  Patient ID: Jeffrey Parks, male    DOB: [birth year]  Age: 72 y.o. MRN: [id number]  CC:  Chief Complaint  Patient presents with   Follow-up    Pt is here today for F/U to discuss labs.     HPI Jeffrey Parks presents for follow-up of October 7 visit.  Multiple chronic conditions discussed at that time.  Updated labs ordered.  Pedal edema: Multiple potential causes discussed last visit, recommended avoiding added salt or high salt foods, has torsemide as needed, leg elevation, and referred to vascular specialist.  Additionally he is on Lyrica - we have discussed lyrica as lilkley contributor of edema. On conrinic lyrica for years. Controls leg shaking that occurred after neck surgery. Has not tried lower dose recently.  Torsemide 2 times per week. 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   Hyperlipidemia: Off statin medication last visit, unknown reason.  Elevated readings on most recent labs, recommended restarting his Lipitor, 10 mg daily - needs new rx.  No known side effects on lipitor prior - ran out.   Lab Results  Component Value Date   CHOL 181 03/21/2023   HDL 41.20 03/21/2023   LDLCALC 104 (H) 03/21/2023   TRIG 175.0 (H) 03/21/2023   CHOLHDL 4 03/21/2023   Lab Results  Component Value Date   ALT 17 03/21/2023   AST 18 03/21/2023   ALKPHOS 94 03/21/2023   BILITOT 0.3 03/21/2023    Balance difficulty.  History of chronic low back pain with lumbar radiculopathy, takes Lyrica 150 mg twice daily for restless leg syndrome - on same dose for times.  Uses cane for ambulation. Notices symptoms of dizziness or feeling off balance with initial standing or moving to quickly. No falls. Gilmer Mor has helped, sometimes reaches for wall.  No room spinning.  Rare tinnitus - high pitch, not persistent, no pulsatile tinnitus. No new focal weakness. Chronic R leg weakness since surgery.  Some  shortness of breath with moving from one place to another. Past year or two. No recent worsening. Denies palpitations/CP/near syncope. Has seen Jeffrey Parks in the past with cardiology. Feels some fatigue, tired at times.  Echo 09/02/21: EF 60-65%, no regional wall motion abnormalities. There is moderate concentric left ventricular hypertrophy.        HM:  had optho visit 2 wks ago - [facility]- requested records.  Flu vaccine at urology few weeks ago.  2nd shingrix recommended at pharmacy.  Tdap 2013.      History Patient Active Problem List   Diagnosis Date Noted   Chronic kidney disease, stage III (moderate) (HCC) 02/08/2018   Stenosis of cervical spine with myelopathy (HCC) 06/22/2017   Oropharyngeal dysphagia 06/22/2017   Diabetes mellitus type 2, uncomplicated (HCC) 05/31/2017   Gait instability 01/14/2017   Hyperreflexia 01/14/2017   Cervicalgia 01/14/2017   Leg swelling 09/08/2016   Sleep apnea 09/08/2016   Dizziness 09/08/2016   Fatigue 05/30/2015   Hypogonadism male 05/22/2015   Morbid obesity due to excess calories (HCC) 05/22/2015   Testosterone insufficiency 05/22/2015   Hypertension, uncontrolled 05/07/2015   Erectile dysfunction 05/07/2015   Past Medical History:  Diagnosis Date   Hypertension    Low testosterone    Sickle cell trait [facility])    Past Surgical History:  Procedure Laterality Date   neck surgery, Dec. 14,2018     None     Allergies  Allergen  Reactions   Norvasc [Amlodipine]    Prior to Admission medications   Medication Sig Start Date End Date Taking? Authorizing Provider  acetaminophen (TYLENOL) 650 MG CR tablet Take 650 mg by mouth every 6 (six) hours as needed for pain.   Yes [provider]  Alprostadil (PROSTAGLANDIN E1) POWD by Does not apply route.   Yes [provider]  atorvastatin (LIPITOR) 10 MG tablet TAKE 1 TABLET(10 MG) BY MOUTH DAILY 04/05/22  Yes Shade Flood, MD  bisacodyl (DULCOLAX) 5 MG EC  tablet Take 5 mg by mouth daily as needed for moderate constipation.   Yes [provider]  carvedilol (COREG) 12.5 MG tablet Take 12.5 mg by mouth 2 (two) times daily.   Yes [provider]  dapagliflozin propanediol (FARXIGA) 10 MG TABS tablet Take 100 mg by mouth daily.   Yes [provider]  KERENDIA 20 MG TABS Take 1 tablet by mouth daily.   Yes [provider]  losartan (COZAAR) 100 MG tablet Take 100 mg by mouth daily.   Yes [provider]  Multiple Vitamins-Minerals (MULTIVIT/MULTIMINERAL ADULT PO) Take by mouth.   Yes [provider]  Potassium (POTASSIMIN PO) Take by mouth.   Yes [provider]  pregabalin (LYRICA) 150 MG capsule Take 1 capsule (150 mg total) by mouth 2 (two) times daily. TAKE 1 CAPSULE(150 MG) BY MOUTH TWICE DAILY 03/11/23  Yes Shade Flood, MD  Sennosides-Docusate Sodium (SENNA-DOCUSATE SODIUM PO) Take by mouth.   Yes [provider]  tamsulosin (FLOMAX) 0.4 MG CAPS capsule TAKE 1 CAPSULE(0.4 MG) BY MOUTH DAILY 03/07/23  Yes Shade Flood, MD  testosterone cypionate (DEPOTESTOSTERONE CYPIONATE) 200 MG/ML injection Inject 200 mg into the muscle every 14 (fourteen) days.   Yes [provider]  torsemide (DEMADEX) 20 MG tablet Take 20 mg by mouth 2 (two) times daily.   Yes [provider]  traMADol (ULTRAM) 50 MG tablet Take 50 mg by mouth 3 (three) times daily as needed. 02/03/23  Yes [provider]  benzonatate (TESSALON) 100 MG capsule Take 1 capsule (100 mg total) by mouth 3 (three) times daily as needed for cough. Patient not taking: Reported on 03/21/2023 10/14/22   Waldon Merl, PA-C  fluticasone [facility]) 50 MCG/ACT nasal spray Place 2 sprays into both nostrils daily. 10/14/22   Waldon Merl, PA-C  latanoprost (XALATAN) 0.005 % ophthalmic solution SMARTSIG:1 Drop(s) In Eye(s) Every Evening Patient not taking: Reported on 03/21/2023 06/08/21   [provider]   Social History   Socioeconomic History   Marital status: Single    Spouse name: Not on file   Number of children: Not on file   Years of education: Not on file   Highest education level: Not on file  Occupational History   Not on file  Tobacco Use   Smoking status: Former    Types: Cigarettes   Smokeless tobacco: Never   Tobacco comments:    Quit two years ago.  Light smoker prior.   Vaping Use   Vaping status: Never Used  Substance and Sexual Activity   Alcohol use: Yes    Alcohol/week: 3.0 standard drinks of alcohol    Types: 3 Standard drinks or equivalent per week   Drug use: No   Sexual activity: Not on file  Other Topics Concern   Not on file  Social History Narrative   Lives with wife, 2 adult children and 3 grandchildren   Has 2 deceased  children - 4 children total   Has PhD in literature   Professor at [employer]   Social Determinants of Health   Financial Resource Strain: Not on file  Food Insecurity: Not on file  Transportation Needs: Not on file  Physical Activity: Not on file  Stress: Not on file  Social Connections: Not on file  Intimate Partner Violence: Not on file    Review of Systems  Per HPI.  Objective:   Vitals:   03/28/23 1522  BP: 132/82  Pulse: 69  Temp: 97.8 F (36.6 C)  SpO2: 96%  Weight: 267 lb 6 oz (121.3 kg)  Height: 5\' 7"  (1.702 m)     Physical Exam Vitals reviewed.  Constitutional:      Appearance: He is well-developed.  HENT:     Head: Normocephalic and atraumatic.  Neck:     Vascular: No carotid bruit or JVD.  Cardiovascular:     Rate and Rhythm: Normal rate and regular rhythm.     Heart sounds: Normal heart sounds. No murmur heard. Pulmonary:     Effort: Pulmonary effort is normal.     Breath sounds: Normal breath sounds. No rales.  Musculoskeletal:     Right lower leg: Edema (tight LE edema to mid tibia, stasis changes, no wounds.) present.     Left lower leg: Edema present.  Skin:     General: Skin is warm and dry.  Neurological:     Mental Status: He is alert and oriented to person, place, and time.  Psychiatric:        Mood and Affect: Mood normal.    Assessment & Plan:  Jeffrey Parks is a 72 y.o. male . Chronic low back pain, unspecified back pain laterality, unspecified whether sciatica present  -Follow-up planned with physical medicine rehab and second opinion with back specialist/surgeon.  Chronic weakness of right leg reported since prior cervical spine surgery.  Denies recent change in symptoms, has cane for assistance.  See information below, may be contributing to the unsteadiness.  Type 2 diabetes mellitus with chronic kidney disease, without long-term current use of insulin, unspecified CKD stage (HCC) - Plan: atorvastatin (LIPITOR) 10 MG tablet  -Well-controlled on recent A1c, no change in diabetic meds for now.  Neuropathic pain Pedal edema Unsteadiness Fatigue, unspecified type  -Chronic pedal edema, restless legs, neuropathic symptoms thought to be related to previous cervical spine disease.  He has been on higher dose of pregabalin in the past, we have discussed potential pedal edema from that med, still may be contributing to his pedal edema, unsteadiness, and fatigue.  Reviewed common side effects with that medication.  Because his neuropathic symptoms, restless leg symptoms have been stable we will try a lower dose of 100 mg Lyrica temporarily with option to return to 150 mg dosing.  Depending on that response could consider neurology eval.  Chronic neck, back issues may also be contributing to his unsteadiness.  Close follow-up and recheck in 6 weeks, sooner if new or worsening symptoms.  -Has torsemide if needed for pedal edema, continue follow-up with vascular specialist as planned.  Hyperlipidemia, unspecified hyperlipidemia type  -Restart statin with Lipitor 10 mg daily, recheck labs in 6 weeks.  Office visit at that time  Meds ordered this  encounter  Medications   atorvastatin (LIPITOR) 10 MG tablet    Sig: TAKE 1 TABLET(10 MG) BY MOUTH DAILY    Dispense:  90 tablet    Refill:  1   Patient Instructions  Torsemide more often may help with swelling, but keep follow up with vascular specialist as planned - let me know if you have not received a call for an appointment in the next week.   Restart atorvastatin once per day for cholesterol, and we can recheck labs in 6 weeks.     Signed,   Meredith Staggers, MD [facility], [facility] 03/28/23 4:12 PM

## 2023-03-29 ENCOUNTER — Encounter: Payer: Self-pay | Admitting: Family Medicine

## 2023-03-29 MED ORDER — PREGABALIN 100 MG PO CAPS: 100.0000 mg | ORAL_CAPSULE | Freq: Two times a day (BID) | ORAL | 1 refills | Status: DC

## 2023-04-04 ENCOUNTER — Ambulatory Visit: Payer: BC Managed Care – PPO | Admitting: Family Medicine

## 2023-04-04 ENCOUNTER — Other Ambulatory Visit: Payer: Self-pay | Admitting: Family Medicine

## 2023-04-04 DIAGNOSIS — R351 Nocturia: Secondary | ICD-10-CM

## 2023-04-19 ENCOUNTER — Other Ambulatory Visit: Payer: Self-pay

## 2023-04-19 DIAGNOSIS — D472 Monoclonal gammopathy: Secondary | ICD-10-CM

## 2023-04-20 ENCOUNTER — Other Ambulatory Visit: Payer: Self-pay

## 2023-04-20 ENCOUNTER — Inpatient Hospital Stay: Payer: BC Managed Care – PPO | Attending: Hematology

## 2023-04-20 DIAGNOSIS — R350 Frequency of micturition: Secondary | ICD-10-CM | POA: Insufficient documentation

## 2023-04-20 DIAGNOSIS — M7989 Other specified soft tissue disorders: Secondary | ICD-10-CM | POA: Insufficient documentation

## 2023-04-20 DIAGNOSIS — D472 Monoclonal gammopathy: Secondary | ICD-10-CM | POA: Insufficient documentation

## 2023-04-20 LAB — CBC WITH DIFFERENTIAL (CANCER CENTER ONLY)
Abs Immature Granulocytes: 0.01 10*3/uL (ref 0.00–0.07)
Basophils Absolute: 0 10*3/uL (ref 0.0–0.1)
Basophils Relative: 1 %
Eosinophils Absolute: 0.1 10*3/uL (ref 0.0–0.5)
Eosinophils Relative: 2 %
HCT: 47.3 % (ref 39.0–52.0)
Hemoglobin: 16.1 g/dL (ref 13.0–17.0)
Immature Granulocytes: 0 %
Lymphocytes Relative: 45 %
Lymphs Abs: 3 10*3/uL (ref 0.7–4.0)
MCH: 30.1 pg (ref 26.0–34.0)
MCHC: 34 g/dL (ref 30.0–36.0)
MCV: 88.4 fL (ref 80.0–100.0)
Monocytes Absolute: 0.5 10*3/uL (ref 0.1–1.0)
Monocytes Relative: 7 %
Neutro Abs: 3.1 10*3/uL (ref 1.7–7.7)
Neutrophils Relative %: 45 %
Platelet Count: 181 10*3/uL (ref 150–400)
RBC: 5.35 MIL/uL (ref 4.22–5.81)
RDW: 13.8 % (ref 11.5–15.5)
WBC Count: 6.7 10*3/uL (ref 4.0–10.5)
nRBC: 0 % (ref 0.0–0.2)

## 2023-04-20 LAB — CMP (CANCER CENTER ONLY)
ALT: 18 U/L (ref 0–44)
AST: 19 U/L (ref 15–41)
Albumin: 4.1 g/dL (ref 3.5–5.0)
Alkaline Phosphatase: 85 U/L (ref 38–126)
Anion gap: 7 (ref 5–15)
BUN: 19 mg/dL (ref 8–23)
CO2: 27 mmol/L (ref 22–32)
Calcium: 9.9 mg/dL (ref 8.9–10.3)
Chloride: 105 mmol/L (ref 98–111)
Creatinine: 1.6 mg/dL — ABNORMAL HIGH (ref 0.61–1.24)
GFR, Estimated: 46 mL/min — ABNORMAL LOW (ref >60)
Glucose, Bld: 111 mg/dL — ABNORMAL HIGH (ref 70–99)
Potassium: 3.6 mmol/L (ref 3.5–5.1)
Sodium: 139 mmol/L (ref 135–145)
Total Bilirubin: 0.7 mg/dL (ref <1.2)
Total Protein: 7.1 g/dL (ref 6.5–8.1)

## 2023-04-20 LAB — LACTATE DEHYDROGENASE: LDH: 178 U/L (ref 98–192)

## 2023-04-21 LAB — KAPPA/LAMBDA LIGHT CHAINS
Kappa free light chain: 31.7 mg/L — ABNORMAL HIGH (ref 3.3–19.4)
Kappa, lambda light chain ratio: 1.74 — ABNORMAL HIGH (ref 0.26–1.65)
Lambda free light chains: 18.2 mg/L (ref 5.7–26.3)

## 2023-04-25 LAB — MULTIPLE MYELOMA PANEL, SERUM
Albumin SerPl Elph-Mcnc: 3.9 g/dL (ref 2.9–4.4)
Albumin/Glob SerPl: 1.4 (ref 0.7–1.7)
Alpha 1: 0.2 g/dL (ref 0.0–0.4)
Alpha2 Glob SerPl Elph-Mcnc: 0.7 g/dL (ref 0.4–1.0)
B-Globulin SerPl Elph-Mcnc: 0.9 g/dL (ref 0.7–1.3)
Gamma Glob SerPl Elph-Mcnc: 1 g/dL (ref 0.4–1.8)
Globulin, Total: 2.8 g/dL (ref 2.2–3.9)
IgA: 228 mg/dL (ref 61–437)
IgG (Immunoglobin G), Serum: 997 mg/dL (ref 603–1613)
IgM (Immunoglobulin M), Srm: 181 mg/dL — ABNORMAL HIGH (ref 20–172)
M Protein SerPl Elph-Mcnc: 0.4 g/dL — ABNORMAL HIGH
Total Protein ELP: 6.7 g/dL (ref 6.0–8.5)

## 2023-04-27 ENCOUNTER — Inpatient Hospital Stay: Payer: BC Managed Care – PPO | Admitting: Hematology

## 2023-04-27 VITALS — BP 160/88 | HR 63 | Temp 97.9°F | Resp 20 | Wt 265.3 lb

## 2023-04-27 DIAGNOSIS — D472 Monoclonal gammopathy: Secondary | ICD-10-CM

## 2023-04-27 NOTE — Progress Notes (Signed)
HEMATOLOGY/ONCOLOGY CLINIC VISIT NOTE  Date of Service: 04/27/2023  Patient Care Team: Shade Flood, MD as PCP - General (Family Medicine) Jodelle Red, MD as PCP - Cardiology (Cardiology) [facility] (Nephrology) Gaylord Shih, [facility] (Specialist) Jannifer Hick, MD as Consulting Physician (Urology) [facility]  CHIEF COMPLAINTS/PURPOSE OF CONSULTATION:  IgM MGUS (monoclonal gammopathy of unknown significance   HISTORY OF PRESENTING ILLNESS:   Jeffrey Parks is a wonderful 72 y.o. male who has been referred to [place] by Dr. Meredith Staggers, MD, for evaluation and management of MGUS. PCP notes that the patient was previously seen by hematology.   He was previously referred to me by Dr. Sabra Heck on 02/15/2018 for Monoclonal Paraproteinemia. Patient was scheduled for an CT scan and did not follow up with [place] after. CT scan showed no signs of Lymphoma.   Patient is here with his wife during today's visit. He complains of muscle pain, mainly his hip, after his back surgery. He has nerve problem, which he has an upcoming Neurologist appointment.   He complains of consistent fatigue since our last visit. His wife reports of his weight gain in the past year.  He had sleep study previously around 4 years ago, which he was prescribed the Cpap machine. However, he is not using the Cpap machine anymore since it did not help him.   He denies fever, chills, abdominal pain, loss of appetite, abnormal bowl moments, unexpected weight loss, and new lumps/bumps during today's visit. However, he complains of leg swelling. He notes that he has not had an ultrasound for his leg as of right now.   INTERVAL HISTORY:  Jeffrey Parks is a 72 y.o. male here for continued evaluation and management of MGUS. He was last seen by me on 04/26/2022 and complained of muscle pain, mainly his hip, after his back surgery, and reported a nerve issue.  Patient also complained of consistent fatigue, weight gain, and leg swelling.   Today, he is accompanied by his wife. He reports that he has been doing well overall over the last year. He reports that his fatigue continues to persist and his energy level is lower. He reports that he was previously using CPAP machine, which did not work well. Pt was previously seen by a sleep-certified neurologist. Patient reports that Lyrica was lowered by his PCP, which has helped with his fatigue.   His neuropathy symptoms are much better but continue to be bothersome. He denies any pain, tingling, or burning. Patient does endorse a tremor and restless legs. His wife reports that patient sometimes has jumpy movements.   Patient denies any fever, infection isses, new lumps/bumps, change in breathing habits, SOB, abdominal pain, back pain, or change in bowel habits.  He complains of new leg swelling. Patient uses compress socks from time to time. He reports interest in using a bed that lifts up at the end to allow him to elevate his legs. Patient is on diuretics including torsemide and farxiga. He complains of frequent urination and his wife reports that he does urinate a lot when using the restroom.   His blood pressure in clinic today is elevated at 160/88. His BP is generally controlled at home. He reports that his weight has generally been stable over the last 4-5 years.   Patient does use a cane.   Patient reports that he is not on testosterone replacement.  MEDICAL HISTORY:  Past Medical History:  Diagnosis Date  Hypertension    Low testosterone    Sickle cell trait (HCC)     SURGICAL HISTORY: Past Surgical History:  Procedure Laterality Date   neck surgery, Dec. 14,2018     None      SOCIAL HISTORY: Social History   Socioeconomic History   Marital status: Single    Spouse name: Not on file   Number of children: Not on file   Years of education: Not on file   Highest education level: Not  on file  Occupational History   Not on file  Tobacco Use   Smoking status: Former    Types: Cigarettes   Smokeless tobacco: Never   Tobacco comments:    Quit two years ago.  Light smoker prior.   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  MEDICATIONS:  Current Outpatient Medications  Medication Sig Dispense Refill   acetaminophen (TYLENOL) 650 MG CR tablet Take 650 mg by mouth every 6 (six) hours as needed for pain.     Alprostadil (PROSTAGLANDIN E1) POWD by Does not apply route.     atorvastatin (LIPITOR) 10 MG tablet TAKE 1 TABLET(10 MG) BY MOUTH DAILY 90 tablet 1   benzonatate (TESSALON) 100 MG capsule Take 1 capsule (100 mg total) by mouth 3 (three) times daily as needed for cough. (Patient not taking: Reported on 03/21/2023) 30 capsule 0   bisacodyl (DULCOLAX) 5 MG EC tablet Take 5 mg by mouth daily as needed for moderate constipation.     carvedilol (COREG) 12.5 MG tablet Take 12.5 mg by mouth 2 (two) times daily.     dapagliflozin propanediol (FARXIGA) 10 MG TABS tablet Take 100 mg by  mouth daily.     fluticasone (FLONASE) 50 MCG/ACT nasal spray Place 2 sprays into both nostrils daily. 16 g 0   KERENDIA 20 MG TABS Take 1 tablet by mouth daily.     latanoprost (XALATAN) 0.005 % ophthalmic solution SMARTSIG:1 Drop(s) In Eye(s) Every Evening (Patient not taking: Reported on 03/21/2023)     losartan (COZAAR) 100 MG tablet Take 100 mg by mouth daily.     Multiple Vitamins-Minerals (MULTIVIT/MULTIMINERAL ADULT PO) Take by mouth.     Potassium (POTASSIMIN PO) Take by mouth.     pregabalin (LYRICA) 100 MG capsule Take 1 capsule (100 mg total) by mouth 2 (two) times daily. 60 capsule 1   Sennosides-Docusate Sodium (SENNA-DOCUSATE SODIUM PO) Take by mouth.  tamsulosin (FLOMAX) 0.4 MG CAPS capsule TAKE 1 CAPSULE(0.4 MG) BY MOUTH DAILY 30 capsule 0   testosterone cypionate (DEPOTESTOSTERONE CYPIONATE) 200 MG/ML injection Inject 200 mg into the muscle every 14 (fourteen) days.     torsemide (DEMADEX) 20 MG tablet Take 20 mg by mouth 2 (two) times daily.     traMADol (ULTRAM) 50 MG tablet Take 50 mg by mouth 3 (three) times daily as needed.     No current facility-administered medications for this visit.    REVIEW OF SYSTEMS:    10 Point review of Systems was done is negative except as noted above.   PHYSICAL EXAMINATION: ECOG PERFORMANCE STATUS: 1 - Symptomatic but completely ambulatory  . Vitals:   04/27/23 1320  BP: (!) 160/88  Pulse: 63  Resp: 20  Temp: 97.9 F (36.6 C)  SpO2: 99%    Filed Weights   04/27/23 1320  Weight: 265 lb 4.8 oz (120.3 kg)    .Body mass index is 41.55 kg/m.   GENERAL:alert, in no acute distress and comfortable SKIN: no acute rashes, no significant lesions EYES: conjunctiva are pink and non-injected, sclera anicteric OROPHARYNX: MMM, no exudates, no oropharyngeal erythema or ulceration NECK: supple, no JVD LYMPH:  no palpable lymphadenopathy in the cervical, axillary or inguinal regions LUNGS: clear to auscultation b/l with normal  respiratory effort HEART: regular rate & rhythm ABDOMEN:  normoactive bowel sounds , non tender, not distended. Extremity: no pedal edema PSYCH: alert & oriented x 3 with fluent speech NEURO: no focal motor/sensory deficits   LABORATORY DATA:  I have reviewed the data as listed .    Latest Ref Rng & Units 04/20/2023    1:07 PM 04/26/2022   12:08 PM 04/05/2022    4:21 PM  CBC  WBC 4.0 - 10.5 K/uL 6.7  7.4  6.8   Hemoglobin 13.0 - 17.0 g/dL 06.3  01.6  01.0   Hematocrit 39.0 - 52.0 % 47.3  46.6  47.8   Platelets 150 - 400 K/uL 181  177  197.0    .    Latest Ref Rng & Units 04/20/2023    1:07 PM 03/21/2023    4:27 PM 04/26/2022   12:08 PM  CMP  Glucose 70 - 99 mg/dL 932  80  90   BUN 8 - 23 mg/dL 19  17  18    Creatinine 0.61 - 1.24 mg/dL 3.55  7.32  2.02   Sodium 135 - 145 mmol/L 139  142  139   Potassium 3.5 - 5.1 mmol/L 3.6  3.7  3.8   Chloride 98 - 111 mmol/L 105  105  104   CO2 22 - 32 mmol/L 27  27  29    Calcium 8.9 - 10.3 mg/dL 9.9  9.6  9.0   Total Protein 6.5 - 8.1 g/dL 7.1  7.1  7.4   Total Bilirubin <1.2 mg/dL 0.7  0.3  0.4   Alkaline Phos 38 - 126 U/L 85  94  77   AST 15 - 41 U/L 19  18  18    ALT 0 - 44 U/L 18  17  15      RADIOGRAPHIC STUDIES: I have personally reviewed the radiological images as listed and agreed with the findings in the report. No results found.  ASSESSMENT & PLAN:   72 y.o. male with   1. 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  PLAN:  -Discussed lab results from 04/20/2023 in detail with patient. CBC normal, showed WBC of  6.7K, hemoglobin of 16.1, and platelets of 181K. -CMP shows stable chronic kidney disease  -LDH normal -myeloma panel from 1 week ago showed stable IgM level and his M protein was stable at 0.4 g/dL. His M protein was previously 0.3 g/dL one year ago.  -K/L light chains stable -Did not feel an enlarged spleen or liver on physical exam -no obvious progression or sign of lymphoma at this  time -discussed potential reasons to consider treatment, such as if there are affects on blood counts, affects on other organs, or there is increase in abnormal protein, which is not a concern at this time.  -discussed that if there is no other explanation for his fatigue, the next step would be a whole body PET scan and evaluation of the bone marrow. However, discussed that generally, if this type of lymphoma were to be causing fatigue, there would also typically be a much broader footprint of findings, which is not the case.  -if there is a concerning change in blood counts, there may be a role for a bone marrow biopsy, which is not a concern at this time.  -discussed that restless leg, fatigue, and leg swelling could potentially be attributed ot untreated sleep apnea. Also discussed that treating sleep apnea may also control blood pressures. He shall connect with his PCP for management.  -educated patient that uncontrolled sleep apnea may cause fatigue -discussed that if his sleep apnea is bad enough that his oxygen levels drop enough at night, there may be role to use oxygen overnight when sleeping, though the primary treatment would be CPAP machine -educated patient that the technology of CPAP machines has changed over time and could be more beneficial than his previous machine. Also discussed potential alternatives to CPAP machine.  -educated patient that significant iron deficiency, thyroid disorders, neuropathy, and untreated sleep apnea can cause restless legs -discussed that a potential reason for urinary frequency may be that he is not emptying the bladder completely due to tight prostate -educated patient that leg swelling may cause sleep disturbances -recommend using compression socks to reduce urinary frequency during the night and manage leg swelling -answered all of patient's and his wife's questions in detail  -will continue to monitor annually over the next year or two. After that  time, there may be considerations of transferring his care to PCP down the line  FOLLOW-UP: RTC with Dr Candise Che in 12 months with labs 1 week prior  The total time spent in the appointment was 23 minutes* .  All of the patient's questions were answered with apparent satisfaction. The patient knows to call the clinic with any problems, questions or concerns.   Wyvonnia Lora MD MS AAHIVMS [facility] Hematology/Oncology Physician [facility]  .*Total Encounter Time as defined by the Centers for Medicare and Medicaid Services includes, in addition to the face-to-face time of a patient visit (documented in the note above) non-face-to-face time: obtaining and reviewing outside history, ordering and reviewing medications, tests or procedures, care coordination (communications with other health care professionals or caregivers) and documentation in the medical record.    I,Mitra Faeizi,acting as a Neurosurgeon for Wyvonnia Lora, MD.,have documented all relevant documentation on the behalf of Wyvonnia Lora, MD,as directed by  Wyvonnia Lora, MD while in the presence of Wyvonnia Lora, MD.  .I have reviewed the above documentation for accuracy and completeness, and I agree with the above. Johney Maine MD

## 2023-05-04 ENCOUNTER — Other Ambulatory Visit: Payer: Self-pay | Admitting: Family Medicine

## 2023-05-04 DIAGNOSIS — R351 Nocturia: Secondary | ICD-10-CM

## 2023-05-06 ENCOUNTER — Other Ambulatory Visit: Payer: Self-pay

## 2023-05-06 DIAGNOSIS — M7989 Other specified soft tissue disorders: Secondary | ICD-10-CM

## 2023-05-09 ENCOUNTER — Ambulatory Visit: Payer: BC Managed Care – PPO | Admitting: Family Medicine

## 2023-05-09 VITALS — BP 138/80 | HR 69 | Temp 98.0°F | Ht 67.0 in | Wt 265.0 lb

## 2023-05-09 DIAGNOSIS — G4730 Sleep apnea, unspecified: Secondary | ICD-10-CM

## 2023-05-09 DIAGNOSIS — M792 Neuralgia and neuritis, unspecified: Secondary | ICD-10-CM | POA: Diagnosis not present

## 2023-05-09 DIAGNOSIS — E785 Hyperlipidemia, unspecified: Secondary | ICD-10-CM | POA: Diagnosis not present

## 2023-05-09 DIAGNOSIS — R600 Localized edema: Secondary | ICD-10-CM | POA: Diagnosis not present

## 2023-05-09 NOTE — Patient Instructions (Addendum)
I will check labs today.  Keep follow up with vascular as planned.  Stay at same dose of lyrica for now. Continue to elevate legs, compression stockings for swelling.  With history of severe sleep apnea, I do recommend follow up with sleep specialist (Dr. Vickey Huger) to discuss options or possible repeat testing. I will place that referral.  Let me know if there are questions.  Take care.

## 2023-05-09 NOTE — Progress Notes (Unsigned)
Subjective:  Patient ID: Jeffrey Parks, male    DOB: [birth year]  Age: 72 y.o. MRN: [id number]  CC:  Chief Complaint  Patient presents with   Hyperlipidemia    Pt here for 6 week recheck    HPI Jeffrey Parks presents for   Pedal edema See last visit, multiple contributors possible, has torsemide as needed, leg elevation and has been referred to vascular specialist.  Intermittent torsemide as option.  Also on Lyrica and we have discussed that as a possible contributor to his edema.  History of chronic neck and back issues, Lyrica has helped control leg shaking after his previous surgery, given stability and control and possible contributor to edema recommended trial of lower dose at 100 mg twice daily at his October visit No new leg shaking on lower dose., no new pain.  Slight decreased leg swelling. Compression stockings at work.  Torsemide - not taking recently.  Appt with vascular 12/6 - ultrasound ordered.  Visit with Dr. Candise Che on 11/13 noted. Discussed untreated OSA - wore mask in the past - years ago - did not like mask. Severe osa. Recommended follow up   Hyperlipidemia: Restarted Lipitor at his October 14 visit. Taking daily. No new side effects.   Lab Results  Component Value Date   CHOL 181 03/21/2023   HDL 41.20 03/21/2023   LDLCALC 104 (H) 03/21/2023   TRIG 175.0 (H) 03/21/2023   CHOLHDL 4 03/21/2023   Lab Results  Component Value Date   ALT 18 04/20/2023   AST 19 04/20/2023   ALKPHOS 85 04/20/2023   BILITOT 0.7 04/20/2023       History Patient Active Problem List   Diagnosis Date Noted   Chronic kidney disease, stage III (moderate) (HCC) 02/08/2018   Stenosis of cervical spine with myelopathy (HCC) 06/22/2017   Oropharyngeal dysphagia 06/22/2017   Diabetes mellitus type 2, uncomplicated (HCC) 05/31/2017   Gait instability 01/14/2017   Hyperreflexia 01/14/2017   Cervicalgia 01/14/2017   Leg swelling 09/08/2016   Sleep apnea 09/08/2016    Dizziness 09/08/2016   Fatigue 05/30/2015   Hypogonadism male 05/22/2015   Morbid obesity due to excess calories (HCC) 05/22/2015   Testosterone insufficiency 05/22/2015   Hypertension, uncontrolled 05/07/2015   Erectile dysfunction 05/07/2015   Past Medical History:  Diagnosis Date   Hypertension    Low testosterone    Sickle cell trait [facility])    Past Surgical History:  Procedure Laterality Date   neck surgery, Dec. 14,2018     None     Allergies  Allergen Reactions   Norvasc [Amlodipine]    Prior to Admission medications   Medication Sig Start Date End Date Taking? Authorizing Provider  acetaminophen (TYLENOL) 650 MG CR tablet Take 650 mg by mouth every 6 (six) hours as needed for pain.   Yes [provider]  Alprostadil (PROSTAGLANDIN E1) POWD by Does not apply route.   Yes [provider]  atorvastatin (LIPITOR) 10 MG tablet TAKE 1 TABLET(10 MG) BY MOUTH DAILY 03/28/23  Yes Shade Flood, MD  bisacodyl (DULCOLAX) 5 MG EC tablet Take 5 mg by mouth daily as needed for moderate constipation.   Yes [provider]  carvedilol (COREG) 12.5 MG tablet Take 12.5 mg by mouth 2 (two) times daily.   Yes [provider]  dapagliflozin propanediol (FARXIGA) 10 MG TABS tablet Take 100 mg by mouth daily.   Yes [provider]  fluticasone (FLONASE) 50 MCG/ACT nasal spray Place 2 sprays  into both nostrils daily. 10/14/22  Yes Waldon Merl, PA-C  KERENDIA 20 MG TABS Take 1 tablet by mouth daily.   Yes [provider]  latanoprost (XALATAN) 0.005 % ophthalmic solution  06/08/21  Yes [provider]  losartan (COZAAR) 100 MG tablet Take 100 mg by mouth daily.   Yes [provider]  Multiple Vitamins-Minerals (MULTIVIT/MULTIMINERAL ADULT PO) Take by mouth.   Yes [provider]  Potassium (POTASSIMIN PO) Take by mouth.   Yes [provider]  pregabalin (LYRICA) 100 MG capsule Take 1 capsule (100 mg  total) by mouth 2 (two) times daily. 03/29/23  Yes Shade Flood, MD  Sennosides-Docusate Sodium (SENNA-DOCUSATE SODIUM PO) Take by mouth.   Yes [provider]  tamsulosin (FLOMAX) 0.4 MG CAPS capsule TAKE 1 CAPSULE(0.4 MG) BY MOUTH DAILY 05/04/23  Yes Shade Flood, MD  testosterone cypionate (DEPOTESTOSTERONE CYPIONATE) 200 MG/ML injection Inject 200 mg into the muscle every 14 (fourteen) days.   Yes [provider]  torsemide (DEMADEX) 20 MG tablet Take 20 mg by mouth 2 (two) times daily.   Yes [provider]  traMADol (ULTRAM) 50 MG tablet Take 50 mg by mouth 3 (three) times daily as needed. 02/03/23  Yes [provider]  benzonatate (TESSALON) 100 MG capsule Take 1 capsule (100 mg total) by mouth 3 (three) times daily as needed for cough. Patient not taking: Reported on 03/21/2023 10/14/22   Waldon Merl, PA-C   Social History   Socioeconomic History   Marital status: Single    Spouse name: Not on file   Number of children: Not on file   Years of education: Not on file   Highest education level: Not on file  Occupational History   Not on file  Tobacco Use   Smoking status: Former    Types: Cigarettes   Smokeless tobacco: Never   Tobacco comments:    Quit two years ago.  Light smoker prior.   Vaping Use   Vaping status: Never Used  Substance and Sexual Activity   Alcohol use: Yes    Alcohol/week: 3.0 standard drinks of alcohol    Types: 3 Standard drinks or equivalent per week   Drug use: No   Sexual activity: Not on file  Other Topics Concern   Not on file  Social History Narrative   Lives with wife, 2 adult children and 3 grandchildren   Has 2 deceased children - 4 children total   Has PhD in literature   Professor at [employer]   Social Determinants of Corporate investment banker Strain: Not on file  Food Insecurity: Not on file  Transportation Needs: Not on file  Physical Activity: Not on file  Stress: Not on  file  Social Connections: Not on file  Intimate Partner Violence: Not on file    Review of Systems   Objective:   Vitals:   05/09/23 1514  BP: 138/80  Pulse: 69  Temp: 98 F (36.7 C)  TempSrc: Temporal  SpO2: 97%  Weight: 265 lb (120.2 kg)  Height: 5\' 7"  (1.702 m)     Physical Exam Vitals reviewed.  Constitutional:      Appearance: He is well-developed.  HENT:     Head: Normocephalic and atraumatic.  Neck:     Vascular: No carotid bruit or JVD.  Cardiovascular:     Rate and Rhythm: Normal rate and regular rhythm.     Heart sounds: Normal heart sounds. No murmur  heard. Pulmonary:     Effort: Pulmonary effort is normal.     Breath sounds: Normal breath sounds. No rales.  Musculoskeletal:     Right lower leg: Edema (1-2+ lower extremities bilaterally, distal half with some stasis changes, no wounds.) present.     Left lower leg: Edema present.  Skin:    General: Skin is warm and dry.  Neurological:     Mental Status: He is alert and oriented to person, place, and time.  Psychiatric:        Mood and Affect: Mood normal.        Assessment & Plan:  Jeffrey Parks is a 72 y.o. male . Pedal edema  Hyperlipidemia, unspecified hyperlipidemia type - Plan: Lipid panel, Comprehensive metabolic panel  Neuropathic pain  Sleep apnea, unspecified type - Plan: Ambulatory referral to Sleep Studies   No orders of the defined types were placed in this encounter.  Patient Instructions  I will check labs today.  Keep follow up with vascular as planned.  Stay at same dose of lyrica for now. Continue to elevate legs, compression stockings for swelling.  With history of severe sleep apnea, I do recommend follow up with sleep specialist (Dr. Vickey Huger) to discuss options or possible repeat testing. I will place that referral.  Let me know if there are questions.  Take care.      Signed,   Meredith Staggers, MD [facility], [facility] 05/09/23 3:55 PM

## 2023-05-10 ENCOUNTER — Encounter: Payer: Self-pay | Admitting: Family Medicine

## 2023-05-10 LAB — LIPID PANEL
Cholesterol: 168 mg/dL (ref 0–200)
HDL: 39.6 mg/dL (ref >39.00)
LDL Cholesterol: 102 mg/dL — ABNORMAL HIGH (ref 0–99)
NonHDL: 128.17
Total CHOL/HDL Ratio: 4
Triglycerides: 130 mg/dL (ref 0.0–149.0)
VLDL: 26 mg/dL (ref 0.0–40.0)

## 2023-05-10 LAB — COMPREHENSIVE METABOLIC PANEL
ALT: 18 U/L (ref 0–53)
AST: 19 U/L (ref 0–37)
Albumin: 4.2 g/dL (ref 3.5–5.2)
Alkaline Phosphatase: 81 U/L (ref 39–117)
BUN: 19 mg/dL (ref 6–23)
CO2: 32 meq/L (ref 19–32)
Calcium: 9.5 mg/dL (ref 8.4–10.5)
Chloride: 105 meq/L (ref 96–112)
Creatinine, Ser: 1.65 mg/dL — ABNORMAL HIGH (ref 0.40–1.50)
GFR: 41.69 mL/min — ABNORMAL LOW (ref >60.00)
Glucose, Bld: 108 mg/dL — ABNORMAL HIGH (ref 70–99)
Potassium: 4.4 meq/L (ref 3.5–5.1)
Sodium: 142 meq/L (ref 135–145)
Total Bilirubin: 0.4 mg/dL (ref 0.2–1.2)
Total Protein: 7 g/dL (ref 6.0–8.3)

## 2023-05-18 ENCOUNTER — Telehealth: Payer: Self-pay

## 2023-05-18 NOTE — Telephone Encounter (Signed)
LDL cholesterol only a few points elevated.  Overall numbers have improved.  Borderline kidney function test and blood sugar but those levels are overall stable from previous readings.  Let me know if you have questions.   Dr. Neva Seat

## 2023-05-19 NOTE — Progress Notes (Signed)
Patient ID: Jeffrey Parks, male   DOB: [birth year], 72 y.o.   MRN: [id number]  Reason for Consult: New Patient (Initial Visit)   Referred by Shade Flood, MD  Subjective:     HPI  Jeffrey Parks is a 72 y.o. male who presents for evaluation of lower extremity swelling.  Bilateral from top of foot to knee Timeframe: Since his neck surgery in 2018 Symptoms: Denies aching, wounds or pain Varicosities: No Previous wounds: No Previous DVT: No In compression: Yes  Past Medical History:  Diagnosis Date   Hypertension    Low testosterone    Sickle cell trait (HCC)    Family History  Problem Relation Age of Onset   Hypertension Mother    Diabetes Other    Sickle cell anemia Son    Sickle cell anemia Son    Past Surgical History:  Procedure Laterality Date   neck surgery, Dec. 14,2018     None      Short Social History:  Social History   Tobacco Use   Smoking status: Former    Types: Cigarettes   Smokeless tobacco: Never   Tobacco comments:    Quit two years ago.  Light smoker prior.   Substance Use Topics   Alcohol use: Yes    Alcohol/week: 3.0 standard drinks of alcohol    Types: 3 Standard drinks or equivalent per week    Allergies  Allergen Reactions   Norvasc [Amlodipine]     Current Outpatient Medications  Medication Sig Dispense Refill   acetaminophen (TYLENOL) 650 MG CR tablet Take 650 mg by mouth every 6 (six) hours as needed for pain.     Alprostadil (PROSTAGLANDIN E1) POWD by Does not apply route.     atorvastatin (LIPITOR) 10 MG tablet TAKE 1 TABLET(10 MG) BY MOUTH DAILY 90 tablet 1   bisacodyl (DULCOLAX) 5 MG EC tablet Take 5 mg by mouth daily as needed for moderate constipation.     carvedilol (COREG) 12.5 MG tablet Take 12.5 mg by mouth 2 (two) times daily.     dapagliflozin propanediol (FARXIGA) 10 MG TABS tablet Take 100 mg by mouth daily.     fluticasone (FLONASE) 50 MCG/ACT nasal spray Place 2 sprays into both nostrils daily. 16  g 0   KERENDIA 20 MG TABS Take 1 tablet by mouth daily.     latanoprost (XALATAN) 0.005 % ophthalmic solution      losartan (COZAAR) 100 MG tablet Take 100 mg by mouth daily.     Multiple Vitamins-Minerals (MULTIVIT/MULTIMINERAL ADULT PO) Take by mouth.     Potassium (POTASSIMIN PO) Take by mouth.     pregabalin (LYRICA) 100 MG capsule Take 1 capsule (100 mg total) by mouth 2 (two) times daily. 60 capsule 1   Sennosides-Docusate Sodium (SENNA-DOCUSATE SODIUM PO) Take by mouth.     tamsulosin (FLOMAX) 0.4 MG CAPS capsule TAKE 1 CAPSULE(0.4 MG) BY MOUTH DAILY 30 capsule 0   testosterone cypionate (DEPOTESTOSTERONE CYPIONATE) 200 MG/ML injection Inject 200 mg into the muscle every 14 (fourteen) days.     torsemide (DEMADEX) 20 MG tablet Take 20 mg by mouth 2 (two) times daily.     traMADol (ULTRAM) 50 MG tablet Take 50 mg by mouth 3 (three) times daily as needed.     benzonatate (TESSALON) 100 MG capsule Take 1 capsule (100 mg total) by mouth 3 (three) times daily as needed for cough. (Patient not taking: Reported on 03/21/2023) 30 capsule 0   No  current facility-administered medications for this visit.    REVIEW OF SYSTEMS  Negative other than noted in HPI     Objective:  Objective   Vitals:   05/20/23 1114  BP: (!) 157/97  Pulse: 68  Resp: 20  Temp: 97.8 F (36.6 C)  SpO2: 94%  Weight: 266 lb (120.7 kg)  Height: 5\' 7"  (1.702 m)   Body mass index is 41.66 kg/m.  Physical Exam General: no acute distress Cardiac: hemodynamically stable Pulm: normal work of breathing Neuro: alert, no focal deficit Extremities: Significant 2+ edema from dorsum of foot to the. Stemmer's sign present   Data: Reflux study +--------------+---------+------+-----------+------------+--------+  LEFT         Reflux NoRefluxReflux TimeDiameter cmsComments                          Yes                                   +--------------+---------+------+-----------+------------+--------+   CFV                    yes   >1 second                       +--------------+---------+------+-----------+------------+--------+  FV prox       no                                              +--------------+---------+------+-----------+------------+--------+  FV mid        no                                              +--------------+---------+------+-----------+------------+--------+  FV dist       no                                              +--------------+---------+------+-----------+------------+--------+  Popliteal    no                                              +--------------+---------+------+-----------+------------+--------+  GSV at SFJ    no                           0.773              +--------------+---------+------+-----------+------------+--------+  GSV prox thighno                           0.499              +--------------+---------+------+-----------+------------+--------+  GSV mid thigh no                           0.488              +--------------+---------+------+-----------+------------+--------+  GSV dist  thighno                           0.483              +--------------+---------+------+-----------+------------+--------+  GSV at knee   no                           0.569              +--------------+---------+------+-----------+------------+--------+  GSV prox calf no                           0.509              +--------------+---------+------+-----------+------------+--------+  SSV Pop Fossa no                           0.393              +--------------+---------+------+-----------+------------+--------+  SSV prox calf no                           0.408              +--------------+---------+------+-----------+------------+--------+  SSV mid calf  no                           0.377               +--------------+---------+------+-----------+------------+--------+    Previous Echo reviewed     Assessment/Plan:     Jeffrey Parks is a 72 y.o. male with bilateral lymphedema, no reflux noted in LLE today.   Referral to lymphedema clinic  Follow up prn.      Daria Pastures MD [facility]

## 2023-05-19 NOTE — Telephone Encounter (Signed)
Called and spoke with patient about labs and he expressed understanding, I am also sending out a hard copy of lab results per the patients request

## 2023-05-20 ENCOUNTER — Encounter: Payer: Self-pay | Admitting: Vascular Surgery

## 2023-05-20 ENCOUNTER — Ambulatory Visit: Payer: BC Managed Care – PPO | Admitting: Vascular Surgery

## 2023-05-20 ENCOUNTER — Ambulatory Visit (HOSPITAL_COMMUNITY)
Admission: RE | Admit: 2023-05-20 | Discharge: 2023-05-20 | Disposition: A | Discharge status: Home / Self Care | Payer: BC Managed Care – PPO | Source: Ambulatory Visit | Attending: Vascular Surgery | Admitting: Vascular Surgery

## 2023-05-20 VITALS — BP 157/97 | HR 68 | Temp 97.8°F | Resp 20 | Ht 67.0 in | Wt 266.0 lb

## 2023-05-20 DIAGNOSIS — M7989 Other specified soft tissue disorders: Secondary | ICD-10-CM | POA: Diagnosis present

## 2023-05-20 DIAGNOSIS — I890 Lymphedema, not elsewhere classified: Secondary | ICD-10-CM

## 2023-05-31 ENCOUNTER — Other Ambulatory Visit: Payer: Self-pay | Admitting: Family Medicine

## 2023-05-31 DIAGNOSIS — M792 Neuralgia and neuritis, unspecified: Secondary | ICD-10-CM

## 2023-05-31 NOTE — Telephone Encounter (Signed)
Requested Prescriptions   Pending Prescriptions Disp Refills   pregabalin (LYRICA) 150 MG capsule [Pharmacy Med Name: PREGABALIN 150MG  CAPSULES] 60 capsule     Sig: TAKE 1 CAPSULE(150 MG) BY MOUTH TWICE DAILY   pregabalin (LYRICA) 100 MG capsule [Pharmacy Med Name: PREGABALIN 100MG  CAPSULES] 60 capsule     Sig: TAKE 1 CAPSULE(100 MG) BY MOUTH TWICE DAILY     Date of patient request: 05/31/2023 Last office visit: 05/09/2023 Upcoming visit: 08/08/2023 Date of last refill: 03/29/2023 Last refill amount: 60, 90

## 2023-05-31 NOTE — Telephone Encounter (Signed)
See chart, dosage of Lyrica was decreased to 100 mg at his October visit, stable at November follow-up.  Refilled 100 mg dose.

## 2023-06-25 ENCOUNTER — Other Ambulatory Visit: Payer: Self-pay | Admitting: Family Medicine

## 2023-06-25 DIAGNOSIS — R351 Nocturia: Secondary | ICD-10-CM

## 2023-06-27 ENCOUNTER — Other Ambulatory Visit: Payer: Self-pay

## 2023-08-08 ENCOUNTER — Ambulatory Visit: Payer: 59 | Admitting: Family Medicine

## 2023-10-18 ENCOUNTER — Other Ambulatory Visit: Payer: Self-pay | Admitting: Family Medicine

## 2023-10-18 DIAGNOSIS — R351 Nocturia: Secondary | ICD-10-CM

## 2023-10-19 NOTE — Telephone Encounter (Signed)
 Called pt to schedule 6 Mnth F/U that's due. Pt needs meds -- lvm to let pt know to cb to schedule appt to receive his request of medications.

## 2023-12-28 ENCOUNTER — Other Ambulatory Visit: Payer: Self-pay

## 2023-12-28 DIAGNOSIS — M7989 Other specified soft tissue disorders: Secondary | ICD-10-CM

## 2023-12-29 ENCOUNTER — Other Ambulatory Visit: Payer: Self-pay | Admitting: *Deleted

## 2023-12-29 DIAGNOSIS — M7989 Other specified soft tissue disorders: Secondary | ICD-10-CM

## 2023-12-29 DIAGNOSIS — I890 Lymphedema, not elsewhere classified: Secondary | ICD-10-CM

## 2024-01-11 NOTE — Progress Notes (Unsigned)
 Patient ID: Jeffrey Parks, male   DOB: February 03, 1951, 73 y.o.   MRN: [id number]  Reason for Consult: No chief complaint on file.   Referred by Delayne Artist PARAS, MD  Subjective:     HPI Jeffrey Parks is a 73 y.o. male presenting for follow-up.  He has known lymphedema and was referred to lymphedema clinic in December 2024.  He is now returns for evaluation of arterial flow.  He denies calf claudication with ambulation, he denies nocturnal rest pain or nonhealing foot/toe wounds.  He is a former smoker.  Past Medical History:  Diagnosis Date   Hypertension    Low testosterone     Sickle cell trait (HCC)    Family History  Problem Relation Age of Onset   Hypertension Mother    Diabetes Other    Sickle cell anemia Son    Sickle cell anemia Son    Past Surgical History:  Procedure Laterality Date   neck surgery, Dec. 14,2018     None      Short Social History:  Social History   Tobacco Use   Smoking status: Former    Types: Cigarettes   Smokeless tobacco: Never   Tobacco comments:    Quit two years ago.  Light smoker prior.   Substance Use Topics   Alcohol  use: Yes    Alcohol /week: 3.0 standard drinks of alcohol     Types: 3 Standard drinks or equivalent per week    Allergies  Allergen Reactions   Norvasc  [Amlodipine ]     Current Outpatient Medications  Medication Sig Dispense Refill   acetaminophen  (TYLENOL ) 650 MG CR tablet Take 650 mg by mouth every 6 (six) hours as needed for pain.     Alprostadil (PROSTAGLANDIN E1) POWD by Does not apply route.     atorvastatin  (LIPITOR) 10 MG tablet TAKE 1 TABLET(10 MG) BY MOUTH DAILY 90 tablet 1   bisacodyl  (DULCOLAX) 5 MG EC tablet Take 5 mg by mouth daily as needed for moderate constipation.     carvedilol  (COREG ) 12.5 MG tablet Take 12.5 mg by mouth 2 (two) times daily.     dapagliflozin propanediol (FARXIGA) 10 MG TABS tablet Take 100 mg by mouth daily.     fluticasone  (FLONASE ) 50 MCG/ACT nasal spray Place 2  sprays into both nostrils daily. 16 g 0   KERENDIA 20 MG TABS Take 1 tablet by mouth daily.     latanoprost (XALATAN) 0.005 % ophthalmic solution      losartan  (COZAAR ) 100 MG tablet Take 100 mg by mouth daily.     Multiple Vitamins-Minerals (MULTIVIT/MULTIMINERAL ADULT PO) Take by mouth.     Potassium (POTASSIMIN PO) Take by mouth.     pregabalin  (LYRICA ) 100 MG capsule TAKE 1 CAPSULE(100 MG) BY MOUTH TWICE DAILY 60 capsule 2   Sennosides-Docusate Sodium  (SENNA-DOCUSATE SODIUM  PO) Take by mouth.     tamsulosin  (FLOMAX ) 0.4 MG CAPS capsule TAKE 1 CAPSULE(0.4 MG) BY MOUTH DAILY 30 capsule 0   testosterone  cypionate (DEPOTESTOSTERONE CYPIONATE) 200 MG/ML injection Inject 200 mg into the muscle every 14 (fourteen) days.     torsemide (DEMADEX) 20 MG tablet Take 20 mg by mouth 2 (two) times daily.     traMADol  (ULTRAM ) 50 MG tablet Take 50 mg by mouth 3 (three) times daily as needed.     No current facility-administered medications for this visit.    REVIEW OF SYSTEMS  All other systems were reviewed and are negative     Objective:  Objective   There were no vitals filed for this visit. There is no height or weight on file to calculate BMI.  Physical Exam General: no acute distress Cardiac: hemodynamically stable Pulm: normal work of breathing Abdomen: non-tender, no pulsatile mass*** Neuro: alert, no focal deficit Extremities: no edema, cyanosis or wounds*** Vascular:   Right: ***  Left: ***  Data: ABI ***     Assessment/Plan:   Jeffrey Parks is a 73 y.o. male with lymphedema who presented for evaluation of lower extremity arterial flow. ***  Recommendations to optimize cardiovascular risk: Abstinence from all tobacco products. Blood glucose control with goal A1c < 7%. Blood pressure control with goal blood pressure < 140/90 mmHg. Lipid reduction therapy with goal LDL-C <100 mg/dL  Aspirin 81mg  PO QD.  Atorvastatin  40-80mg  PO QD (or other high intensity statin  therapy).   Norman GORMAN Serve MD [facility]

## 2024-01-13 ENCOUNTER — Ambulatory Visit (HOSPITAL_COMMUNITY)
Admission: RE | Admit: 2024-01-13 | Discharge: 2024-01-13 | Disposition: A | Discharge status: Home / Self Care | Source: Ambulatory Visit | Attending: Vascular Surgery | Admitting: Vascular Surgery

## 2024-01-13 ENCOUNTER — Ambulatory Visit: Attending: Vascular Surgery | Admitting: Vascular Surgery

## 2024-01-13 ENCOUNTER — Encounter: Payer: Self-pay | Admitting: Vascular Surgery

## 2024-01-13 VITALS — BP 168/102 | HR 76 | Temp 98.1°F | Ht 67.0 in | Wt 266.0 lb

## 2024-01-13 DIAGNOSIS — I890 Lymphedema, not elsewhere classified: Secondary | ICD-10-CM | POA: Diagnosis present

## 2024-01-13 DIAGNOSIS — M7989 Other specified soft tissue disorders: Secondary | ICD-10-CM | POA: Insufficient documentation

## 2024-01-13 LAB — VAS US ABI WITH/WO TBI
Left ABI: 0.93
Right ABI: 1.01

## 2024-03-27 ENCOUNTER — Encounter (HOSPITAL_COMMUNITY): Payer: Self-pay

## 2024-03-27 ENCOUNTER — Ambulatory Visit (INDEPENDENT_AMBULATORY_CARE_PROVIDER_SITE_OTHER)

## 2024-03-27 ENCOUNTER — Ambulatory Visit (HOSPITAL_COMMUNITY)
Admission: RE | Admit: 2024-03-27 | Discharge: 2024-03-27 | Disposition: A | Discharge status: Home / Self Care | Source: Ambulatory Visit | Attending: Internal Medicine | Admitting: Internal Medicine

## 2024-03-27 VITALS — BP 132/78 | HR 91 | Temp 98.2°F | Resp 20

## 2024-03-27 DIAGNOSIS — R0602 Shortness of breath: Secondary | ICD-10-CM | POA: Diagnosis not present

## 2024-03-27 DIAGNOSIS — J208 Acute bronchitis due to other specified organisms: Secondary | ICD-10-CM

## 2024-03-27 DIAGNOSIS — M542 Cervicalgia: Secondary | ICD-10-CM

## 2024-03-27 DIAGNOSIS — R051 Acute cough: Secondary | ICD-10-CM | POA: Diagnosis not present

## 2024-03-27 DIAGNOSIS — J22 Unspecified acute lower respiratory infection: Secondary | ICD-10-CM

## 2024-03-27 LAB — POC COVID19/FLU A&B COMBO
Covid Antigen, POC: NEGATIVE
Influenza A Antigen, POC: NEGATIVE
Influenza B Antigen, POC: NEGATIVE

## 2024-03-27 MED ORDER — ALBUTEROL SULFATE HFA 108 (90 BASE) MCG/ACT IN AERS: 1.0000 | INHALATION_SPRAY | Freq: Four times a day (QID) | RESPIRATORY_TRACT | 0 refills | Status: AC | PRN

## 2024-03-27 MED ORDER — BENZONATATE 100 MG PO CAPS: 100.0000 mg | ORAL_CAPSULE | Freq: Three times a day (TID) | ORAL | 0 refills | Status: AC

## 2024-03-27 MED ORDER — PREDNISONE 20 MG PO TABS: 40.0000 mg | ORAL_TABLET | Freq: Every day | ORAL | 0 refills | Status: AC

## 2024-03-27 MED ORDER — AZITHROMYCIN 250 MG PO TABS: ORAL_TABLET | ORAL | 0 refills | Status: AC

## 2024-03-27 NOTE — Discharge Instructions (Addendum)
 Flu A, flu B and COVID are negative.  Chest x-ray done today did not show signs of pneumonia.  Symptoms, physical exam findings and duration of symptoms is most consistent with a lower respiratory infection versus acute bronchitis.  Physical exam findings and vital signs are reassuring.  We will treat with the following: Azithromycin 250mg  Take 2 tablets today and the 1 tablet daily for 4 more days. Prednisone  40 mg (2 tablets) once daily for 3 days. Take this in the morning.  This is a steroid to help with inflammation.  Monitor your blood sugars as this can cause an elevation.  If you see a significant elevation then would discontinue this medication. Benzonatate  (tessalon ) 100 mg every 8 hours as needed for cough.  Albuterol inhaler 1-2 puffs every 6 hours as needed for wheezing/shortness of breath.  Make sure to stay hydrated by drinking plenty of water. Return to urgent care or PCP if symptoms worsen or fail to resolve.

## 2024-03-27 NOTE — ED Triage Notes (Signed)
 Pt c/o coughing, body aches, weakness, and SOB x2 days. States took OTC meds with no relief.

## 2024-03-27 NOTE — ED Provider Notes (Signed)
 [facility]    CSN: [id number] Arrival date & time: 03/27/24  1431      History   Chief Complaint Chief Complaint  Patient presents with   Cough    Cough, congested, secret coughing for more than a week - Entered by patient    HPI Jeffrey Parks is a 73 y.o. male.   73 year old male presents urgent care with complaints of cough, congestion, shortness of breath, body aches and generalized weakness and fatigue.  He said he was having some symptoms about a week ago but in the last 2 to 3 days the symptoms have worsened significantly.  He is coughing all the time whether it is during the day or at night.  He has developed shortness of breath that has been persistent with activity as well as at rest.  He has not had as much of an appetite as usual.  He has felt fevers and chills.  He denies any chest pain, nausea, vomiting, abdominal pain.  He has taken some over-the-counter medication without relief.  He is unsure if he has had any sick exposures.   Cough Associated symptoms: chills, fever, myalgias and shortness of breath   Associated symptoms: no chest pain, no ear pain, no rash and no sore throat     Past Medical History:  Diagnosis Date   Hypertension    Low testosterone     Sickle cell trait     Patient Active Problem List   Diagnosis Date Noted   Chronic kidney disease, stage III (moderate) (HCC) 02/08/2018   Stenosis of cervical spine with myelopathy (HCC) 06/22/2017   Oropharyngeal dysphagia 06/22/2017   Diabetes mellitus type 2, uncomplicated (HCC) 05/31/2017   Gait instability 01/14/2017   Hyperreflexia 01/14/2017   Cervicalgia 01/14/2017   Leg swelling 09/08/2016   Sleep apnea 09/08/2016   Dizziness 09/08/2016   Fatigue 05/30/2015   Hypogonadism male 05/22/2015   Morbid obesity due to excess calories (HCC) 05/22/2015   Testosterone  insufficiency 05/22/2015   Hypertension, uncontrolled 05/07/2015   Erectile dysfunction 05/07/2015    Past  Surgical History:  Procedure Laterality Date   neck surgery, Dec. 14,2018     None         Home Medications    Prior to Admission medications   Medication Sig Start Date End Date Taking? Authorizing Provider  acetaminophen  (TYLENOL ) 650 MG CR tablet Take 650 mg by mouth every 6 (six) hours as needed for pain.    [provider]  albuterol (VENTOLIN HFA) 108 (90 Base) MCG/ACT inhaler Inhale 1-2 puffs into the lungs every 6 (six) hours as needed for wheezing or shortness of breath. 03/27/24  Yes Madelynne Lasker A, PA-C  Alprostadil (PROSTAGLANDIN E1) POWD by Does not apply route.    [provider]  atorvastatin  (LIPITOR) 10 MG tablet TAKE 1 TABLET(10 MG) BY MOUTH DAILY 03/28/23   Levora Reyes SAUNDERS, MD  azithromycin (ZITHROMAX) 250 MG tablet Take first 2 tablets together, then 1 every day until finished. 03/27/24  Yes Raeden Schippers A, PA-C  benzonatate  (TESSALON ) 100 MG capsule Take 1 capsule (100 mg total) by mouth every 8 (eight) hours. 03/27/24  Yes Destan Franchini A, PA-C  bisacodyl  (DULCOLAX) 5 MG EC tablet Take 5 mg by mouth daily as needed for moderate constipation.    [provider]  carvedilol  (COREG ) 12.5 MG tablet Take 12.5 mg by mouth 2 (two) times daily.    [provider]  dapagliflozin propanediol (FARXIGA) 10 MG TABS tablet  Take 100 mg by mouth daily.    [provider]  fluticasone  (FLONASE ) 50 MCG/ACT nasal spray Place 2 sprays into both nostrils daily. 10/14/22   Gladis Elsie BROCKS, PA-C  KERENDIA 20 MG TABS Take 1 tablet by mouth daily.    [provider]  latanoprost (XALATAN) 0.005 % ophthalmic solution  06/08/21   [provider]  losartan  (COZAAR ) 100 MG tablet Take 100 mg by mouth daily.    [provider]  Multiple Vitamins-Minerals (MULTIVIT/MULTIMINERAL ADULT PO) Take by mouth.    [provider]  Potassium (POTASSIMIN PO) Take by mouth.    [provider]  predniSONE   (DELTASONE ) 20 MG tablet Take 2 tablets (40 mg total) by mouth daily with breakfast for 3 days. 03/27/24 03/30/24 Yes Amica Harron A, PA-C  pregabalin  (LYRICA ) 100 MG capsule TAKE 1 CAPSULE(100 MG) BY MOUTH TWICE DAILY 05/31/23   Levora Reyes SAUNDERS, MD  Sennosides-Docusate Sodium  (SENNA-DOCUSATE SODIUM  PO) Take by mouth.    [provider]  tamsulosin  (FLOMAX ) 0.4 MG CAPS capsule TAKE 1 CAPSULE(0.4 MG) BY MOUTH DAILY 10/19/23   Levora Reyes SAUNDERS, MD  testosterone  cypionate (DEPOTESTOSTERONE CYPIONATE) 200 MG/ML injection Inject 200 mg into the muscle every 14 (fourteen) days.    [provider]  torsemide (DEMADEX) 20 MG tablet Take 20 mg by mouth 2 (two) times daily.    [provider]  traMADol  (ULTRAM ) 50 MG tablet Take 50 mg by mouth 3 (three) times daily as needed. 02/03/23   [provider]    Family History Family History  Problem Relation Age of Onset   Hypertension Mother    Diabetes Other    Sickle cell anemia Son    Sickle cell anemia Son     Social History Social History   Tobacco Use   Smoking status: Former    Types: Cigarettes   Smokeless tobacco: Never   Tobacco comments:    Quit two years ago.  Light smoker prior.   Vaping Use   Vaping status: Never Used  Substance Use Topics   Alcohol  use: Yes    Alcohol /week: 3.0 standard drinks of alcohol     Types: 3 Standard drinks or equivalent per week   Drug use: No     Allergies   Norvasc  [amlodipine ]   Review of Systems Review of Systems  Constitutional:  Positive for chills, fatigue and fever.  HENT:  Positive for congestion. Negative for ear pain and sore throat.   Eyes:  Negative for pain and visual disturbance.  Respiratory:  Positive for cough and shortness of breath.   Cardiovascular:  Negative for chest pain and palpitations.  Gastrointestinal:  Negative for abdominal pain and vomiting.  Genitourinary:  Negative for dysuria and hematuria.  Musculoskeletal:  Positive  for myalgias. Negative for arthralgias and back pain.  Skin:  Negative for color change and rash.  Neurological:  Positive for weakness (Generalized). Negative for seizures and syncope.  All other systems reviewed and are negative.    Physical Exam Triage Vital Signs ED Triage Vitals  Encounter Vitals Group     BP 03/27/24 1450 132/78     Girls Systolic BP Percentile --      Girls Diastolic BP Percentile --      Boys Systolic BP Percentile --      Boys Diastolic BP Percentile --      Pulse Rate 03/27/24 1450 91     Resp 03/27/24 1450 20     Temp 03/27/24 1450  98.2 F (36.8 C)     Temp Source 03/27/24 1450 Oral     SpO2 03/27/24 1450 95 %     Weight --      Height --      Head Circumference --      Peak Flow --      Pain Score 03/27/24 1449 10     Pain Loc --      Pain Education --      Exclude from Growth Chart --    No data found.  Updated Vital Signs BP 132/78 (BP Location: Right Arm)   Pulse 91   Temp 98.2 F (36.8 C) (Oral)   Resp 20   SpO2 95%   Visual Acuity Right Eye Distance:   Left Eye Distance:   Bilateral Distance:    Right Eye Near:   Left Eye Near:    Bilateral Near:     Physical Exam Vitals and nursing note reviewed.  Constitutional:      General: He is not in acute distress.    Appearance: He is well-developed.  HENT:     Head: Normocephalic and atraumatic.     Right Ear: Tympanic membrane normal.     Left Ear: Tympanic membrane normal.     Mouth/Throat:     Pharynx: Posterior oropharyngeal erythema (Mild) present.  Eyes:     Conjunctiva/sclera: Conjunctivae normal.  Cardiovascular:     Rate and Rhythm: Normal rate and regular rhythm.     Heart sounds: No murmur heard. Pulmonary:     Effort: Pulmonary effort is normal. No tachypnea or respiratory distress.     Breath sounds: Examination of the right-upper field reveals wheezing. Examination of the left-upper field reveals wheezing. Examination of the right-middle field reveals  wheezing. Examination of the left-middle field reveals wheezing. Examination of the right-lower field reveals decreased breath sounds. Examination of the left-lower field reveals decreased breath sounds. Decreased breath sounds and wheezing present.  Abdominal:     Palpations: Abdomen is soft.     Tenderness: There is no abdominal tenderness.  Musculoskeletal:        General: No swelling.     Cervical back: Neck supple.  Skin:    General: Skin is warm and dry.     Capillary Refill: Capillary refill takes less than 2 seconds.  Neurological:     Mental Status: He is alert.  Psychiatric:        Mood and Affect: Mood normal.      UC Treatments / Results  Labs (all labs ordered are listed, but only abnormal results are displayed) Labs Reviewed  POC COVID19/FLU A&B COMBO    EKG   Radiology DG Chest 2 View Result Date: 03/27/2024 CLINICAL DATA:  cough, shortness of breath EXAM: CHEST - 2 VIEW COMPARISON:  01/22/2021 FINDINGS: Stable heart size and vascularity. Minor mid and lower lung strandy densities, favored to be atelectasis or scarring. No focal pneumonia, collapse or consolidation. Negative for edema, large effusion or pneumothorax. Trachea midline. Aorta atherosclerotic. Degenerative changes throughout the spine. IMPRESSION: Minor mid and lower lung atelectasis versus scarring. Electronically Signed   By: CHRISTELLA.  Shick M.D.   On: 03/27/2024 15:32    Procedures Procedures (including critical care time)  Medications Ordered in UC Medications - No data to display  Initial Impression / Assessment and Plan / UC Course  I have reviewed the triage vital signs and the nursing notes.  Pertinent labs & imaging results that were available during my care  of the patient were reviewed by me and considered in my medical decision making (see chart for details).     Acute cough - Plan: POC Covid19/Flu A&B Antigen, POC Covid19/Flu A&B Antigen, DG Chest 2 View, DG Chest 2 View  Shortness of  breath - Plan: DG Chest 2 View, DG Chest 2 View  Lower respiratory infection  Acute bronchitis due to other specified organisms   Flu A, flu B and COVID are negative.  Chest x-ray done today did not show signs of pneumonia.  Symptoms, physical exam findings and duration of symptoms is most consistent with a lower respiratory infection versus acute bronchitis.  Physical exam findings and vital signs are reassuring.  We will treat with the following: Azithromycin 250mg  Take 2 tablets today and the 1 tablet daily for 4 more days. Prednisone  40 mg (2 tablets) once daily for 3 days. Take this in the morning.  This is a steroid to help with inflammation.  Monitor your blood sugars as this can cause an elevation.  If you see a significant elevation then would discontinue this medication. Benzonatate  (tessalon ) 100 mg every 8 hours as needed for cough.  Albuterol inhaler 1-2 puffs every 6 hours as needed for wheezing/shortness of breath.  Make sure to stay hydrated by drinking plenty of water. Return to urgent care or PCP if symptoms worsen or fail to resolve.    Final Clinical Impressions(s) / UC Diagnoses   Final diagnoses:  Acute cough  Shortness of breath  Lower respiratory infection  Acute bronchitis due to other specified organisms     Discharge Instructions      Flu A, flu B and COVID are negative.  Chest x-ray done today did not show signs of pneumonia.  Symptoms, physical exam findings and duration of symptoms is most consistent with a lower respiratory infection versus acute bronchitis.  Physical exam findings and vital signs are reassuring.  We will treat with the following: Azithromycin 250mg  Take 2 tablets today and the 1 tablet daily for 4 more days. Prednisone  40 mg (2 tablets) once daily for 3 days. Take this in the morning.  This is a steroid to help with inflammation.  Monitor your blood sugars as this can cause an elevation.  If you see a significant elevation then would  discontinue this medication. Benzonatate  (tessalon ) 100 mg every 8 hours as needed for cough.  Albuterol inhaler 1-2 puffs every 6 hours as needed for wheezing/shortness of breath.  Make sure to stay hydrated by drinking plenty of water. Return to urgent care or PCP if symptoms worsen or fail to resolve.       ED Prescriptions     Medication Sig Dispense Auth. Provider   azithromycin (ZITHROMAX) 250 MG tablet Take first 2 tablets together, then 1 every day until finished. 6 tablet Yeila Morro A, PA-C   benzonatate  (TESSALON ) 100 MG capsule Take 1 capsule (100 mg total) by mouth every 8 (eight) hours. 21 capsule Andres Escandon A, PA-C   albuterol (VENTOLIN HFA) 108 (90 Base) MCG/ACT inhaler Inhale 1-2 puffs into the lungs every 6 (six) hours as needed for wheezing or shortness of breath. 6.7 g Sinclair Alligood A, PA-C   predniSONE  (DELTASONE ) 20 MG tablet Take 2 tablets (40 mg total) by mouth daily with breakfast for 3 days. 6 tablet Teresa Almarie LABOR, PA-C      PDMP not reviewed this encounter.   Teresa Almarie LABOR, PA-C 03/27/24 1539

## 2024-04-19 ENCOUNTER — Other Ambulatory Visit: Payer: Self-pay

## 2024-04-19 DIAGNOSIS — D472 Monoclonal gammopathy: Secondary | ICD-10-CM

## 2024-04-20 ENCOUNTER — Inpatient Hospital Stay: Payer: BC Managed Care – PPO | Attending: Family Medicine

## 2024-04-27 ENCOUNTER — Inpatient Hospital Stay: Payer: BC Managed Care – PPO | Admitting: Hematology

## 2024-05-14 ENCOUNTER — Inpatient Hospital Stay: Attending: Hematology | Admitting: Hematology

## 2024-05-14 ENCOUNTER — Inpatient Hospital Stay

## 2024-05-14 VITALS — BP 171/90 | HR 66 | Temp 97.5°F | Resp 20 | Wt 246.7 lb

## 2024-05-14 DIAGNOSIS — D472 Monoclonal gammopathy: Secondary | ICD-10-CM

## 2024-05-14 LAB — CMP (CANCER CENTER ONLY)
ALT: 24 U/L (ref 0–44)
AST: 28 U/L (ref 15–41)
Albumin: 4.2 g/dL (ref 3.5–5.0)
Alkaline Phosphatase: 92 U/L (ref 38–126)
Anion gap: 9 (ref 5–15)
BUN: 20 mg/dL (ref 8–23)
CO2: 30 mmol/L (ref 22–32)
Calcium: 9.5 mg/dL (ref 8.9–10.3)
Chloride: 105 mmol/L (ref 98–111)
Creatinine: 1.73 mg/dL — ABNORMAL HIGH (ref 0.61–1.24)
GFR, Estimated: 42 mL/min — ABNORMAL LOW (ref >60)
Glucose, Bld: 107 mg/dL — ABNORMAL HIGH (ref 70–99)
Potassium: 3.3 mmol/L — ABNORMAL LOW (ref 3.5–5.1)
Sodium: 144 mmol/L (ref 135–145)
Total Bilirubin: 0.4 mg/dL (ref 0.0–1.2)
Total Protein: 7.1 g/dL (ref 6.5–8.1)

## 2024-05-14 LAB — LACTATE DEHYDROGENASE: LDH: 215 U/L (ref 105–235)

## 2024-05-14 LAB — CBC WITH DIFFERENTIAL (CANCER CENTER ONLY)
Abs Immature Granulocytes: 0.01 K/uL (ref 0.00–0.07)
Basophils Absolute: 0 K/uL (ref 0.0–0.1)
Basophils Relative: 0 %
Eosinophils Absolute: 0.1 K/uL (ref 0.0–0.5)
Eosinophils Relative: 1 %
HCT: 45.2 % (ref 39.0–52.0)
Hemoglobin: 15.4 g/dL (ref 13.0–17.0)
Immature Granulocytes: 0 %
Lymphocytes Relative: 35 %
Lymphs Abs: 2.7 K/uL (ref 0.7–4.0)
MCH: 31 pg (ref 26.0–34.0)
MCHC: 34.1 g/dL (ref 30.0–36.0)
MCV: 91.1 fL (ref 80.0–100.0)
Monocytes Absolute: 0.5 K/uL (ref 0.1–1.0)
Monocytes Relative: 7 %
Neutro Abs: 4.4 K/uL (ref 1.7–7.7)
Neutrophils Relative %: 57 %
Platelet Count: 174 K/uL (ref 150–400)
RBC: 4.96 MIL/uL (ref 4.22–5.81)
RDW: 15 % (ref 11.5–15.5)
WBC Count: 7.8 K/uL (ref 4.0–10.5)
nRBC: 0 % (ref 0.0–0.2)

## 2024-05-14 NOTE — Progress Notes (Signed)
 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 male who has been referred to us  by Dr. Reyes Levora, MD, for evaluation and management of MGUS. PCP notes that the patient was previously seen by hematology.    He was previously referred to me by Dr. Bernardino Gasman on 02/15/2018 for Monoclonal Paraproteinemia. Patient was scheduled for an CT scan and did not follow up with us  after. CT scan showed no signs of Lymphoma.    Patient is here with his wife during today's visit. He complains of muscle pain, mainly his hip, after his back surgery. He has nerve problem, which he has an upcoming Neurologist appointment.    He complains of consistent fatigue since our last visit. His wife reports of his weight gain in the past year.   He had sleep study previously around 4 years ago, which he was prescribed the Cpap machine. However, he is not using the Cpap machine anymore since it did not help him.    He denies fever, chills, abdominal pain, loss of appetite, abnormal bowl moments, unexpected weight loss, and new lumps/bumps during today's visit. However, he complains of leg swelling. He notes that he has not had an ultrasound for his leg as of right now.   SUMMARY OF ONCOLOGIC HISTORY: Oncology History   No history exists.    INTERVAL HISTORY:  Jeffrey Parks is a 73 y.o. male who is here today for continued evaluation and management of IgM MGUS  (monoclonal gammopathy of unknown significance. He is accompanied by his wife.   he was last seen by me on Visit date not found; at the time he mentioned experiencing consistent fatigue, weight gain, and leg swelling.   Today, he notes that his energy level fluctuates. He notes some leg swelling.   Denies issues passing urine or any fever/chills, weight loss, bone pain, abdominal pain, bowel changes, chest pain, and SOB.  He has a new PCP:  Artist Cohens, MD  REVIEW OF SYSTEMS:   10 Point review of systems of done and is negative except as noted above.  MEDICAL HISTORY Past Medical History:  Diagnosis Date   Hypertension    Low testosterone     Sickle cell trait     SURGICAL HISTORY Past Surgical History:  Procedure Laterality Date   neck surgery, Dec. 14,2018     None      SOCIAL HISTORY Social History   Tobacco Use   Smoking status: Former    Types: Cigarettes   Smokeless tobacco: Never   Tobacco comments:    Quit two years ago.  Light smoker prior.   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  MEDICATIONS  Current Outpatient Medications  Medication Sig Dispense Refill   acetaminophen  (TYLENOL ) 650 MG CR tablet Take 650 mg by mouth every 6 (six) hours as needed for  pain.     albuterol  (VENTOLIN  HFA) 108 (90 Base) MCG/ACT inhaler Inhale 1-2 puffs into the lungs every 6 (six) hours as needed for wheezing or shortness of breath. 6.7 g 0   Alprostadil (PROSTAGLANDIN E1) POWD by Does not apply route.     atorvastatin  (LIPITOR) 10 MG tablet TAKE 1 TABLET(10 MG) BY MOUTH DAILY 90 tablet 1   azithromycin  (ZITHROMAX ) 250 MG tablet Take first 2 tablets together, then 1 every day until finished. 6 tablet 0   benzonatate  (TESSALON ) 100 MG capsule Take 1 capsule (100 mg total) by mouth every 8 (eight) hours. 21 capsule 0   bisacodyl  (DULCOLAX) 5 MG EC tablet Take 5 mg by mouth daily as needed for moderate constipation.     carvedilol  (COREG ) 12.5 MG tablet Take 12.5 mg by mouth 2 (two) times daily.     dapagliflozin propanediol (FARXIGA) 10 MG TABS tablet Take 100 mg by mouth daily.     fluticasone  (FLONASE ) 50 MCG/ACT nasal spray Place 2 sprays into both nostrils daily. 16 g 0   KERENDIA 20 MG TABS Take 1 tablet by mouth daily.     latanoprost (XALATAN) 0.005 % ophthalmic solution      losartan  (COZAAR ) 100 MG tablet Take 100 mg by mouth daily.     Multiple Vitamins-Minerals (MULTIVIT/MULTIMINERAL ADULT PO) Take by mouth.     Potassium (POTASSIMIN PO) Take by mouth.     pregabalin  (LYRICA ) 100 MG capsule TAKE 1 CAPSULE(100 MG) BY MOUTH TWICE DAILY 60 capsule 2   Sennosides-Docusate Sodium  (SENNA-DOCUSATE SODIUM  PO) Take by mouth.     tamsulosin  (FLOMAX ) 0.4 MG CAPS capsule TAKE 1 CAPSULE(0.4 MG) BY MOUTH DAILY 30 capsule 0   testosterone  cypionate (DEPOTESTOSTERONE CYPIONATE) 200 MG/ML injection Inject 200 mg into the muscle every 14 (fourteen) days.     torsemide (DEMADEX) 20 MG tablet Take 20 mg by mouth 2 (two) times daily.     traMADol  (ULTRAM ) 50 MG tablet Take 50 mg by mouth 3 (three) times daily as needed.     No current facility-administered medications for this visit.    PHYSICAL EXAMINATION: ECOG PERFORMANCE STATUS: 1 - Symptomatic but completely  ambulatory VITALS: Vitals:   05/14/24 1320 05/14/24 1336  BP: (!) 188/95 (!) 171/90  Pulse: 66   Resp: 20   Temp: (!) 97.5 F (36.4 C)   SpO2: 96%    Filed Weights   05/14/24 1320  Weight: 246 lb 11.2 oz (111.9 kg)   Body mass index is 38.64 kg/m.  GENERAL: alert, in no acute distress and comfortable SKIN: no acute rashes, no significant lesions EYES: conjunctiva are pink and non-injected, sclera anicteric OROPHARYNX: MMM, no exudates, no oropharyngeal erythema or ulceration NECK: supple, no JVD LYMPH:  no palpable lymphadenopathy in the cervical, axillary or inguinal regions LUNGS: clear to auscultation b/l with  normal respiratory effort HEART: regular rate & rhythm ABDOMEN:  normoactive bowel sounds , non tender, not distended, no hepatosplenomegaly Extremity: no pedal edema PSYCH: alert & oriented x 3 with fluent speech NEURO: no focal motor/sensory deficits  LABORATORY DATA:   I have reviewed the data as listed     Latest Ref Rng & Units 04/20/2023    1:07 PM 04/26/2022   12:08 PM 04/05/2022    4:21 PM  CBC EXTENDED  WBC 4.0 - 10.5 K/uL 6.7  7.4  6.8   RBC 4.22 - 5.81 MIL/uL 5.35  5.44  5.38   Hemoglobin 13.0 - 17.0 g/dL 83.8  84.2  84.3   HCT 39.0 - 52.0 % 47.3  46.6  47.8   Platelets 150 - 400 K/uL 181  177  197.0   NEUT# 1.7 - 7.7 K/uL 3.1  3.6    Lymph# 0.7 - 4.0 K/uL 3.0  3.1          Latest Ref Rng & Units 05/09/2023    4:00 PM 04/20/2023    1:07 PM 03/21/2023    4:27 PM  CMP  Glucose 70 - 99 mg/dL 891  888  80   BUN 6 - 23 mg/dL 19  19  17    Creatinine 0.40 - 1.50 mg/dL 8.34  8.39  8.37   Sodium 135 - 145 mEq/L 142  139  142   Potassium 3.5 - 5.1 mEq/L 4.4  3.6  3.7   Chloride 96 - 112 mEq/L 105  105  105   CO2 19 - 32 mEq/L 32  27  27   Calcium  8.4 - 10.5 mg/dL 9.5  9.9  9.6   Total Protein 6.0 - 8.3 g/dL 7.0  7.1  7.1   Total Bilirubin 0.2 - 1.2 mg/dL 0.4  0.7  0.3   Alkaline Phos 39 - 117 U/L 81  85  94   AST 0 - 37 U/L 19  19  18    ALT  0 - 53 U/L 18  18  17     LDH: Component     Latest Ref Rng 04/20/2023  LDH     98 - 192 U/L 178    Multiple Myeloma Panel:  Component     Latest Ref Rng 04/20/2023  Globulin, Total     2.2 - 3.9 g/dL 2.8 (C)  IgG (Immunoglobin G), Serum     603 - 1,613 mg/dL 002   IgA     61 - 562 mg/dL 771   IgM (Immunoglobulin M), Srm     20 - 172 mg/dL 818 (H)   Total Protein ELP     6.0 - 8.5 g/dL 6.7 (C)  Albumin SerPl Elph-Mcnc     2.9 - 4.4 g/dL 3.9 (C)  Alpha 1     0.0 - 0.4 g/dL 0.2 (C)  Alpha2 Glob SerPl Elph-Mcnc     0.4 - 1.0 g/dL 0.7 (C)  B-Globulin SerPl Elph-Mcnc     0.7 - 1.3 g/dL 0.9 (C)  Gamma Glob SerPl Elph-Mcnc     0.4 - 1.8 g/dL 1.0 (C)  M Protein SerPl Elph-Mcnc     Not Observed g/dL 0.4 (H) (C)  Albumin/Glob SerPl     0.7 - 1.7  1.4 (C)  IFE 1 Comment ! (C)  Please Note (HCV): Comment (C)    Legend: (H) High ! Abnormal (C) Corrected  Kappa/Lamba light chains:  Component     Latest Ref Rng 04/20/2023  Kappa free  light chain     3.3 - 19.4 mg/L 31.7 (H)   Lambda free light chains     5.7 - 26.3 mg/L 18.2   Kappa, lambda light chain ratio     0.26 - 1.65  1.74 (H)     Legend: (H) High  RADIOGRAPHIC STUDIES: I have personally reviewed the radiological images as listed and agreed with the findings in the report. DG Chest 2 View Result Date: 03/27/2024 CLINICAL DATA:  cough, shortness of breath EXAM: CHEST - 2 VIEW COMPARISON:  01/22/2021 FINDINGS: Stable heart size and vascularity. Minor mid and lower lung strandy densities, favored to be atelectasis or scarring. No focal pneumonia, collapse or consolidation. Negative for edema, large effusion or pneumothorax. Trachea midline. Aorta atherosclerotic. Degenerative changes throughout the spine. IMPRESSION: Minor mid and lower lung atelectasis versus scarring. Electronically Signed   By: CHRISTELLA.  Shick M.D.   On: 03/27/2024 15:32    ASSESSMENT & PLAN:   73 y.o. male with  1. 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   PLAN: - Discussed lab results on 05/14/2024 in detail with patient: - CBC is within normal limits and shows a normal hemoglobin of 15.4 normal WBC count of 7.8k and normal platelets of 174k CMP stable potassium of 3.3 creatinine of 1.73 due to chronic kidney disease LDH within normal limits at 215 Myeloma panel shows stable M spike of 0.2 g/dL of IgM kappa monoclonal protein Normal serum kappa lambda free light chain ratio Patient has no evidence of progression of his IgM kappa monoclonal gammopathy.  No evidence of symptomatic lymphoma. Patient continues to desire conservative approach and wants to hold off on bone marrow biopsy at this time. RTC with Dr Onesimo in 12 months with labs 1 week prior  FOLLOW-UP in 12 months for labs and follow-up with Dr. Onesimo.  The total time spent in the appointment was 21 minutes* .  All of the patient's questions were answered and the patient knows to call the clinic with any problems, questions, or concerns.  Emaline Onesimo MD MS AAHIVMS [facility] Hematology/Oncology Physician [facility]  *Total Encounter Time as defined by the Centers for Medicare and Medicaid Services includes, in addition to the face-to-face time of a patient visit (documented in the note above) non-face-to-face time: obtaining and reviewing outside history, ordering and reviewing medications, tests or procedures, care coordination (communications with other health care professionals or caregivers) and documentation in the medical record.  I, Marijo Sharps, acting as a neurosurgeon for Emaline Onesimo, MD.,have documented all relevant documentation on the behalf of Emaline Onesimo, MD,as directed by  Emaline Onesimo, MD while in the presence of Emaline Onesimo, MD.  I have reviewed the above documentation for accuracy and completeness, and I agree with the above.  Areon Cocuzza, MD

## 2024-05-15 LAB — KAPPA/LAMBDA LIGHT CHAINS
Kappa free light chain: 33.9 mg/L — ABNORMAL HIGH (ref 3.3–19.4)
Kappa, lambda light chain ratio: 1.59 (ref 0.26–1.65)
Lambda free light chains: 21.3 mg/L (ref 5.7–26.3)

## 2024-05-16 LAB — MULTIPLE MYELOMA PANEL, SERUM
Albumin SerPl Elph-Mcnc: 3.6 g/dL (ref 2.9–4.4)
Albumin/Glob SerPl: 1.3 (ref 0.7–1.7)
Alpha 1: 0.2 g/dL (ref 0.0–0.4)
Alpha2 Glob SerPl Elph-Mcnc: 0.9 g/dL (ref 0.4–1.0)
B-Globulin SerPl Elph-Mcnc: 0.9 g/dL (ref 0.7–1.3)
Gamma Glob SerPl Elph-Mcnc: 1 g/dL (ref 0.4–1.8)
Globulin, Total: 3 g/dL (ref 2.2–3.9)
IgA: 227 mg/dL (ref 61–437)
IgG (Immunoglobin G), Serum: 960 mg/dL (ref 603–1613)
IgM (Immunoglobulin M), Srm: 157 mg/dL — ABNORMAL HIGH (ref 15–143)
M Protein SerPl Elph-Mcnc: 0.3 g/dL — ABNORMAL HIGH
Total Protein ELP: 6.6 g/dL (ref 6.0–8.5)

## 2025-05-20 ENCOUNTER — Inpatient Hospital Stay

## 2025-05-27 ENCOUNTER — Inpatient Hospital Stay: Admitting: Hematology
